# Patient Record
Sex: Female | Born: 1945
Health system: Southern US, Community
[De-identification: ages and names within clinical notes are randomized; demographics above are authoritative.]

## PROBLEM LIST (undated history)

## (undated) DIAGNOSIS — I3139 Other pericardial effusion (noninflammatory): Secondary | ICD-10-CM

## (undated) DIAGNOSIS — E785 Hyperlipidemia, unspecified: Secondary | ICD-10-CM

## (undated) DIAGNOSIS — I313 Pericardial effusion (noninflammatory): Secondary | ICD-10-CM

## (undated) DIAGNOSIS — E079 Disorder of thyroid, unspecified: Secondary | ICD-10-CM

## (undated) DIAGNOSIS — L237 Allergic contact dermatitis due to plants, except food: Secondary | ICD-10-CM

## (undated) DIAGNOSIS — I341 Nonrheumatic mitral (valve) prolapse: Secondary | ICD-10-CM

## (undated) DIAGNOSIS — R42 Dizziness and giddiness: Secondary | ICD-10-CM

## (undated) HISTORY — DX: Pericardial effusion (noninflammatory): I31.3

## (undated) HISTORY — DX: Nonrheumatic mitral (valve) prolapse: I34.1

## (undated) HISTORY — DX: Dizziness and giddiness: R42

## (undated) HISTORY — DX: Allergic contact dermatitis due to plants, except food: L23.7

## (undated) HISTORY — DX: Disorder of thyroid, unspecified: E07.9

## (undated) HISTORY — DX: Hyperlipidemia, unspecified: E78.5

## (undated) HISTORY — PX: SPINE SURGERY: SHX786

## (undated) HISTORY — DX: Other pericardial effusion (noninflammatory): I31.39

---
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
## 1981-09-28 HISTORY — PX: TUBAL LIGATION: SHX77

## 1982-09-28 DIAGNOSIS — I341 Nonrheumatic mitral (valve) prolapse: Secondary | ICD-10-CM

## 1982-09-28 HISTORY — DX: Nonrheumatic mitral (valve) prolapse: I34.1

## 1996-09-28 HISTORY — PX: BREAST BIOPSY: SHX20

## 2001-02-09 ENCOUNTER — Encounter: Payer: Self-pay | Admitting: Internal Medicine

## 2001-02-09 ENCOUNTER — Ambulatory Visit (HOSPITAL_COMMUNITY): Admission: RE | Admit: 2001-02-09 | Discharge: 2001-02-09 | Payer: Self-pay | Admitting: Internal Medicine

## 2001-03-04 ENCOUNTER — Ambulatory Visit (HOSPITAL_COMMUNITY): Admission: RE | Admit: 2001-03-04 | Discharge: 2001-03-04 | Payer: Self-pay | Admitting: Internal Medicine

## 2001-03-04 ENCOUNTER — Encounter: Payer: Self-pay | Admitting: Internal Medicine

## 2002-02-10 ENCOUNTER — Ambulatory Visit (HOSPITAL_COMMUNITY): Admission: RE | Admit: 2002-02-10 | Discharge: 2002-02-10 | Payer: Self-pay | Admitting: Internal Medicine

## 2002-02-10 ENCOUNTER — Encounter: Payer: Self-pay | Admitting: Internal Medicine

## 2003-02-15 ENCOUNTER — Encounter: Payer: Self-pay | Admitting: Internal Medicine

## 2003-02-15 ENCOUNTER — Ambulatory Visit (HOSPITAL_COMMUNITY): Admission: RE | Admit: 2003-02-15 | Discharge: 2003-02-15 | Payer: Self-pay | Admitting: Internal Medicine

## 2003-12-25 ENCOUNTER — Ambulatory Visit (HOSPITAL_COMMUNITY): Admission: RE | Admit: 2003-12-25 | Discharge: 2003-12-25 | Payer: Self-pay | Admitting: Family Medicine

## 2004-02-19 ENCOUNTER — Ambulatory Visit (HOSPITAL_COMMUNITY): Admission: RE | Admit: 2004-02-19 | Discharge: 2004-02-19 | Payer: Self-pay | Admitting: Internal Medicine

## 2004-08-01 ENCOUNTER — Encounter: Payer: Self-pay | Admitting: Family Medicine

## 2004-08-01 ENCOUNTER — Ambulatory Visit (HOSPITAL_COMMUNITY): Admission: RE | Admit: 2004-08-01 | Discharge: 2004-08-01 | Payer: Self-pay | Admitting: Family Medicine

## 2004-09-15 ENCOUNTER — Ambulatory Visit: Payer: Self-pay | Admitting: Internal Medicine

## 2004-10-17 ENCOUNTER — Ambulatory Visit (HOSPITAL_COMMUNITY): Admission: RE | Admit: 2004-10-17 | Discharge: 2004-10-17 | Payer: Self-pay | Admitting: Internal Medicine

## 2004-10-17 ENCOUNTER — Encounter: Payer: Self-pay | Admitting: Family Medicine

## 2004-10-17 ENCOUNTER — Ambulatory Visit: Payer: Self-pay | Admitting: Internal Medicine

## 2005-02-14 LAB — HM MAMMOGRAPHY: HM Mammogram: NORMAL

## 2005-04-02 ENCOUNTER — Ambulatory Visit (HOSPITAL_COMMUNITY): Admission: RE | Admit: 2005-04-02 | Discharge: 2005-04-02 | Payer: Self-pay | Admitting: Family Medicine

## 2005-10-21 ENCOUNTER — Ambulatory Visit: Payer: Self-pay | Admitting: Family Medicine

## 2005-10-21 ENCOUNTER — Ambulatory Visit (HOSPITAL_COMMUNITY): Admission: RE | Admit: 2005-10-21 | Discharge: 2005-10-21 | Payer: Self-pay | Admitting: Family Medicine

## 2005-10-26 ENCOUNTER — Ambulatory Visit (HOSPITAL_COMMUNITY): Admission: RE | Admit: 2005-10-26 | Discharge: 2005-10-26 | Payer: Self-pay | Admitting: Family Medicine

## 2005-10-28 ENCOUNTER — Ambulatory Visit (HOSPITAL_COMMUNITY): Admission: RE | Admit: 2005-10-28 | Discharge: 2005-10-28 | Payer: Self-pay | Admitting: Family Medicine

## 2006-03-04 ENCOUNTER — Encounter: Payer: Self-pay | Admitting: Family Medicine

## 2006-03-04 ENCOUNTER — Ambulatory Visit: Payer: Self-pay | Admitting: Family Medicine

## 2006-03-04 ENCOUNTER — Other Ambulatory Visit: Admission: RE | Admit: 2006-03-04 | Discharge: 2006-03-04 | Payer: Self-pay | Admitting: Family Medicine

## 2006-04-26 ENCOUNTER — Ambulatory Visit (HOSPITAL_COMMUNITY): Admission: RE | Admit: 2006-04-26 | Discharge: 2006-04-26 | Payer: Self-pay | Admitting: Family Medicine

## 2006-05-05 ENCOUNTER — Encounter: Admission: RE | Admit: 2006-05-05 | Discharge: 2006-05-05 | Payer: Self-pay | Admitting: Family Medicine

## 2006-05-12 ENCOUNTER — Ambulatory Visit (HOSPITAL_COMMUNITY): Payer: Self-pay | Admitting: Oncology

## 2006-05-12 ENCOUNTER — Encounter: Admission: RE | Admit: 2006-05-12 | Discharge: 2006-05-12 | Payer: Self-pay | Admitting: Oncology

## 2006-09-28 HISTORY — PX: BACK SURGERY: SHX140

## 2006-11-15 ENCOUNTER — Ambulatory Visit (HOSPITAL_COMMUNITY): Payer: Self-pay | Admitting: Oncology

## 2006-11-15 ENCOUNTER — Encounter (HOSPITAL_COMMUNITY): Admission: RE | Admit: 2006-11-15 | Discharge: 2006-12-15 | Payer: Self-pay | Admitting: Oncology

## 2007-02-24 ENCOUNTER — Ambulatory Visit: Payer: Self-pay | Admitting: Family Medicine

## 2007-03-10 ENCOUNTER — Encounter (HOSPITAL_COMMUNITY): Admission: RE | Admit: 2007-03-10 | Discharge: 2007-04-09 | Payer: Self-pay | Admitting: Family Medicine

## 2007-03-14 ENCOUNTER — Encounter: Payer: Self-pay | Admitting: Family Medicine

## 2007-03-14 LAB — CONVERTED CEMR LAB
BUN: 14 mg/dL (ref 6–23)
Basophils Absolute: 0 10*3/uL (ref 0.0–0.1)
Basophils Relative: 1 % (ref 0–1)
CO2: 29 meq/L (ref 19–32)
Calcium: 9.7 mg/dL (ref 8.4–10.5)
Chloride: 104 meq/L (ref 96–112)
Cholesterol: 200 mg/dL (ref 0–200)
Creatinine, Ser: 0.64 mg/dL (ref 0.40–1.20)
Eosinophils Relative: 2 % (ref 0–5)
HDL: 67 mg/dL (ref >39)
LDL Cholesterol: 121 mg/dL — ABNORMAL HIGH (ref 0–99)
Lymphocytes Relative: 30 % (ref 12–46)
Lymphs Abs: 1.4 10*3/uL (ref 0.7–3.3)
MCHC: 33.6 g/dL (ref 30.0–36.0)
MCV: 92.1 fL (ref 78.0–100.0)
Monocytes Relative: 8 % (ref 3–11)
Neutro Abs: 2.7 10*3/uL (ref 1.7–7.7)
Platelets: 230 10*3/uL (ref 150–400)
Potassium: 4.7 meq/L (ref 3.5–5.3)
RBC: 4.68 M/uL (ref 3.87–5.11)
Triglycerides: 61 mg/dL (ref <150)
VLDL: 12 mg/dL (ref 0–40)

## 2007-04-11 ENCOUNTER — Ambulatory Visit (HOSPITAL_COMMUNITY): Admission: RE | Admit: 2007-04-11 | Discharge: 2007-04-11 | Payer: Self-pay | Admitting: Family Medicine

## 2007-04-12 ENCOUNTER — Encounter (HOSPITAL_COMMUNITY): Admission: RE | Admit: 2007-04-12 | Discharge: 2007-05-12 | Payer: Self-pay | Admitting: Family Medicine

## 2007-07-22 ENCOUNTER — Ambulatory Visit (HOSPITAL_COMMUNITY): Admission: RE | Admit: 2007-07-22 | Discharge: 2007-07-22 | Payer: Self-pay | Admitting: Family Medicine

## 2007-07-29 ENCOUNTER — Ambulatory Visit (HOSPITAL_COMMUNITY): Admission: RE | Admit: 2007-07-29 | Discharge: 2007-07-30 | Payer: Self-pay | Admitting: Neurosurgery

## 2007-09-29 ENCOUNTER — Encounter: Payer: Self-pay | Admitting: Family Medicine

## 2007-11-25 ENCOUNTER — Encounter (HOSPITAL_COMMUNITY): Admission: RE | Admit: 2007-11-25 | Discharge: 2007-12-25 | Payer: Self-pay | Admitting: Oncology

## 2008-02-08 ENCOUNTER — Encounter: Payer: Self-pay | Admitting: Family Medicine

## 2008-02-08 ENCOUNTER — Other Ambulatory Visit: Admission: RE | Admit: 2008-02-08 | Discharge: 2008-02-08 | Payer: Self-pay | Admitting: Family Medicine

## 2008-02-08 ENCOUNTER — Ambulatory Visit: Payer: Self-pay | Admitting: Family Medicine

## 2008-02-08 LAB — CONVERTED CEMR LAB: Pap Smear: NORMAL

## 2008-02-14 ENCOUNTER — Ambulatory Visit (HOSPITAL_COMMUNITY): Admission: RE | Admit: 2008-02-14 | Discharge: 2008-02-14 | Payer: Self-pay | Admitting: Family Medicine

## 2008-02-16 ENCOUNTER — Encounter: Payer: Self-pay | Admitting: Family Medicine

## 2008-02-16 DIAGNOSIS — E785 Hyperlipidemia, unspecified: Secondary | ICD-10-CM | POA: Insufficient documentation

## 2008-02-23 ENCOUNTER — Encounter: Payer: Self-pay | Admitting: Family Medicine

## 2008-02-23 LAB — CONVERTED CEMR LAB
BUN: 16 mg/dL (ref 6–23)
CO2: 28 meq/L (ref 19–32)
Calcium: 9.3 mg/dL (ref 8.4–10.5)
Chloride: 104 meq/L (ref 96–112)
Creatinine, Ser: 0.61 mg/dL (ref 0.40–1.20)
Eosinophils Absolute: 0.1 10*3/uL (ref 0.0–0.7)
Eosinophils Relative: 3 % (ref 0–5)
Glucose, Bld: 94 mg/dL (ref 70–99)
Hemoglobin: 14.7 g/dL (ref 12.0–15.0)
Lymphs Abs: 1.3 10*3/uL (ref 0.7–4.0)
MCHC: 32.5 g/dL (ref 30.0–36.0)
MCV: 93 fL (ref 78.0–100.0)
Monocytes Absolute: 0.4 10*3/uL (ref 0.1–1.0)
Neutrophils Relative %: 57 % (ref 43–77)
Platelets: 230 10*3/uL (ref 150–400)
RBC: 4.87 M/uL (ref 3.87–5.11)
Sodium: 144 meq/L (ref 135–145)
Total CHOL/HDL Ratio: 2.9
Triglycerides: 92 mg/dL (ref <150)
WBC: 4.3 10*3/uL (ref 4.0–10.5)

## 2008-08-02 ENCOUNTER — Ambulatory Visit (HOSPITAL_COMMUNITY): Admission: RE | Admit: 2008-08-02 | Discharge: 2008-08-02 | Payer: Self-pay | Admitting: Family Medicine

## 2009-01-16 ENCOUNTER — Telehealth: Payer: Self-pay | Admitting: Family Medicine

## 2009-01-17 ENCOUNTER — Ambulatory Visit: Payer: Self-pay | Admitting: Family Medicine

## 2009-01-17 DIAGNOSIS — L255 Unspecified contact dermatitis due to plants, except food: Secondary | ICD-10-CM | POA: Insufficient documentation

## 2009-02-18 ENCOUNTER — Telehealth: Payer: Self-pay | Admitting: Family Medicine

## 2009-06-04 ENCOUNTER — Encounter: Payer: Self-pay | Admitting: Family Medicine

## 2009-08-19 ENCOUNTER — Ambulatory Visit (HOSPITAL_COMMUNITY): Admission: RE | Admit: 2009-08-19 | Discharge: 2009-08-19 | Payer: Self-pay | Admitting: Family Medicine

## 2010-01-14 ENCOUNTER — Ambulatory Visit: Payer: Self-pay | Admitting: Cardiovascular Disease

## 2010-01-14 DIAGNOSIS — R072 Precordial pain: Secondary | ICD-10-CM | POA: Insufficient documentation

## 2010-03-24 ENCOUNTER — Other Ambulatory Visit: Admission: RE | Admit: 2010-03-24 | Discharge: 2010-03-24 | Payer: Self-pay | Admitting: Family Medicine

## 2010-03-24 ENCOUNTER — Ambulatory Visit: Payer: Self-pay | Admitting: Family Medicine

## 2010-03-24 DIAGNOSIS — R5383 Other fatigue: Secondary | ICD-10-CM | POA: Insufficient documentation

## 2010-03-24 LAB — CONVERTED CEMR LAB: OCCULT 1: NEGATIVE

## 2010-03-26 DIAGNOSIS — M810 Age-related osteoporosis without current pathological fracture: Secondary | ICD-10-CM | POA: Insufficient documentation

## 2010-03-27 ENCOUNTER — Encounter: Payer: Self-pay | Admitting: Family Medicine

## 2010-03-27 ENCOUNTER — Telehealth: Payer: Self-pay | Admitting: Family Medicine

## 2010-03-27 LAB — CONVERTED CEMR LAB: Pap Smear: NEGATIVE

## 2010-03-28 ENCOUNTER — Encounter: Payer: Self-pay | Admitting: Family Medicine

## 2010-04-16 ENCOUNTER — Encounter: Payer: Self-pay | Admitting: Family Medicine

## 2010-04-21 LAB — CONVERTED CEMR LAB
BUN: 16 mg/dL (ref 6–23)
Basophils Relative: 1 % (ref 0–1)
CO2: 29 meq/L (ref 19–32)
Cholesterol: 214 mg/dL — ABNORMAL HIGH (ref 0–200)
Creatinine, Ser: 0.69 mg/dL (ref 0.40–1.20)
Glucose, Bld: 94 mg/dL (ref 70–99)
HCT: 45.7 % (ref 36.0–46.0)
Lymphs Abs: 1.3 10*3/uL (ref 0.7–4.0)
MCHC: 31.5 g/dL (ref 30.0–36.0)
MCV: 93.1 fL (ref 78.0–100.0)
Monocytes Absolute: 0.4 10*3/uL (ref 0.1–1.0)
Monocytes Relative: 8 % (ref 3–12)
Potassium: 5.3 meq/L (ref 3.5–5.3)
RBC: 4.91 M/uL (ref 3.87–5.11)
Sodium: 144 meq/L (ref 135–145)
Total CHOL/HDL Ratio: 3.1
Triglycerides: 91 mg/dL (ref <150)
VLDL: 18 mg/dL (ref 0–40)
WBC: 4.4 10*3/uL (ref 4.0–10.5)

## 2010-04-22 ENCOUNTER — Telehealth: Payer: Self-pay | Admitting: Family Medicine

## 2010-09-11 ENCOUNTER — Ambulatory Visit (HOSPITAL_COMMUNITY)
Admission: RE | Admit: 2010-09-11 | Discharge: 2010-09-11 | Payer: Self-pay | Source: Home / Self Care | Attending: Family Medicine | Admitting: Family Medicine

## 2010-10-19 ENCOUNTER — Encounter: Payer: Self-pay | Admitting: Family Medicine

## 2010-10-19 ENCOUNTER — Encounter: Payer: Self-pay | Admitting: *Deleted

## 2010-10-28 NOTE — Letter (Signed)
Summary: Letter  Letter   Imported By: Lind Guest 03/28/2010 08:37:34  _____________________________________________________________________  External Attachment:    Type:   Image     Comment:   External Document

## 2010-10-28 NOTE — Letter (Signed)
Summary: [employer]  [employer]   Imported By: Lind Guest 03/28/2010 08:42:08  _____________________________________________________________________  External Attachment:    Type:   Image     Comment:   External Document

## 2010-10-28 NOTE — Assessment & Plan Note (Signed)
Summary: physical   Vital Signs:  Patient profile:   65 year old female Menstrual status:  postmenopausal Height:      66 inches Weight:      133.75 pounds BMI:     21.67 O2 Sat:      97 % Pulse rate:   70 / minute Pulse rhythm:   regular Resp:     16 per minute BP sitting:   110 / 70  (left arm) Cuff size:   regular  Vitals Entered By: Everitt Amber LPN (March 24, 2010 2:31 PM)  Vision Screening:Left eye with correction: 20 / 20 Right eye with correction: 20 / 20 Both eyes with correction: 20 / 20  Color vision testing: normal      Vision Entered By: Everitt Amber LPN (March 24, 2010 2:32 PM)     Menstrual Status postmenopausal Last PAP Result normal   Primary Care Provider:  Syliva Overman   History of Present Illness: Reports  that t she has been doing well. She remains focussed on healthy eating and regular exercise , espescially since herspouse now has CAD  Denies recent fever or chills. Denies sinus pressure, nasal congestion , ear pain or sore throat. Denies chest congestion, or cough productive of sputum. Denies chest pain, palpitations, PND, orthopnea or leg swelling. Denies abdominal pain, nausea, vomitting, diarrhea or constipation. Denies change in bowel movements or bloody stool. Denies dysuria , frequency, incontinence or hesitancy. Denies  joint pain, swelling, or reduced mobility.She does have concerns about  the cost of actonel and is requesting coupons, I will need to see if she has been on the med for over 5 yrs as it should be discontinued after that, i will get back to her. Denies headaches, vertigo, seizures. Denies depression, anxiety or insomnia. Denies  rash, lesions, or itch.     Current Medications (verified): 1)  Actonel 150 Mg Tabs (Risedronate Sodium) .... Take One Tab Once Monthly 2)  Oysco 500+d 500-600 Mg-Unit Chew (Calcium Carb-Cholecalciferol) .... One Chew Three Times A Day 3)  Sulfamethoxazole-Tmp Ds 800-160 Mg Tabs  (Sulfamethoxazole-Trimethoprim) .... Take 1 Tablet By Mouth Two Times A Day As Needed  Allergies (verified): No Known Drug Allergies  Past History:  Past Medical History: Borderline hyperlipidemia  DERMATITIS, POISON OAK Vertigo Mitral valve prolapse diagnosed in 1984 cystitis      Review of Systems      See HPI Eyes:  Denies blurring, discharge, eye pain, and red eye. MS:  Denies joint pain and joint redness. Endo:  Denies cold intolerance, excessive hunger, excessive thirst, excessive urination, heat intolerance, polyuria, and weight change. Heme:  Denies abnormal bruising and bleeding. Allergy:  Denies hives or rash and itching eyes.  Physical Exam  General:  Well-developed,well-nourished,in no acute distress; alert,appropriate and cooperative throughout examination Head:  Normocephalic and atraumatic without obvious abnormalities. No apparent alopecia or balding. Eyes:  No corneal or conjunctival inflammation noted. EOMI. Perrla. Funduscopic exam benign, without hemorrhages, exudates or papilledema. Vision grossly normal. Ears:  External ear exam shows no significant lesions or deformities.  Otoscopic examination reveals clear canals, tympanic membranes are intact bilaterally without bulging, retraction, inflammation or discharge. Hearing is grossly normal bilaterally. Nose:  External nasal examination shows no deformity or inflammation. Nasal mucosa are pink and moist without lesions or exudates. Mouth:  Oral mucosa and oropharynx without lesions or exudates.  Teeth in good repair. Neck:  No deformities, masses, or tenderness noted. Chest Wall:  No deformities, masses, or tenderness noted. Breasts:  No mass, nodules, thickening, tenderness, bulging, retraction, inflamation, nipple discharge or skin changes noted.   Lungs:  Normal respiratory effort, chest expands symmetrically. Lungs are clear to auscultation, no crackles or wheezes. Heart:  Normal rate and regular rhythm.  S1 and S2 normal without gallop, murmur, click, rub or other extra sounds. Abdomen:  Bowel sounds positive,abdomen soft and non-tender without masses, organomegaly or hernias noted. Rectal:  No external abnormalities noted. Normal sphincter tone. No rectal masses or tenderness. Genitalia:  Normal introitus for age, no external lesions, no vaginal discharge, mucosa pink and moist, no vaginal or cervical lesions, no vaginal atrophy, no friaility or hemorrhage, normal uterus size and position, no adnexal masses or tenderness Msk:  No deformity or scoliosis noted of thoracic or lumbar spine.   Pulses:  R and L carotid,radial,femoral,dorsalis pedis and posterior tibial pulses are full and equal bilaterally Extremities:  No clubbing, cyanosis, edema, or deformity noted with normal full range of motion of all joints.   Neurologic:  No cranial nerve deficits noted. Station and gait are normal. Plantar reflexes are down-going bilaterally. DTRs are symmetrical throughout. Sensory, motor and coordinative functions appear intact. Skin:  Intact without suspicious lesions or rashes Cervical Nodes:  No lymphadenopathy noted Axillary Nodes:  No palpable lymphadenopathy Inguinal Nodes:  No significant adenopathy Psych:  Cognition and judgment appear intact. Alert and cooperative with normal attention span and concentration. No apparent delusions, illusions, hallucinations   Impression & Recommendations:  Problem # 1:  DYSLIPIDEMIA (ICD-272.4) Assessment Comment Only  Orders: T-Lipid Profile (16109-60454)    HDL:66 (02/23/2008), 67 (03/14/2007)  LDL:106 (02/23/2008), 121 (03/14/2007)  Chol:190 (02/23/2008), 200 (03/14/2007)  Trig:92 (02/23/2008), 61 (03/14/2007)  Problem # 2:  OBESITY (ICD-278.00) [pt currently on actonel , will need to research the duration before refilling  Complete Medication List: 1)  Actonel 150 Mg Tabs (Risedronate sodium) .... Take one tab once monthly 2)  Oysco 500+d 500-600  Mg-unit Chew (Calcium carb-cholecalciferol) .... One chew three times a day 3)  Sulfamethoxazole-tmp Ds 800-160 Mg Tabs (Sulfamethoxazole-trimethoprim) .... Take 1 tablet by mouth two times a day as needed  Other Orders: T-Basic Metabolic Panel [phone]) T-CBC w/Diff [phone]) Pap Smear (57846) Hemoccult Guaiac-1 spec.(in office) (96295)  Patient Instructions: 1)  CPE in 1 year, pls call for appt. 2)  We will contact you regarding your need fopr  colonscopy, bone density scan and whether you need to stay on the actonel. We will also try to get actonerl tabs. 3)  Pls take asprin 81mg  one daily, calcim with d 1200mg /100IU one daily and one vitamin daily.    4)  It is important that you exercise regularly at least 20 minutes 5 times a week. If you develop chest pain, have severe difficulty breathing, or feel very tired , stop exercising immediately and seek medical attention.      Laboratory Results    Stool - Occult Blood Hemmoccult #1: negative Date: 03/24/2010 Comments: [id number] 9r 8/11 118 10 12

## 2010-10-28 NOTE — Letter (Signed)
Summary: consult  consult   Imported By: Curtis Sites 04/23/2010 14:28:12  _____________________________________________________________________  External Attachment:    Type:   Image     Comment:   External Document

## 2010-10-28 NOTE — Letter (Signed)
Summary: misc.  misc.   Imported By: Curtis Sites 04/23/2010 14:29:33  _____________________________________________________________________  External Attachment:    Type:   Image     Comment:   External Document

## 2010-10-28 NOTE — Letter (Signed)
Summary: phone notes  phone notes   Imported By: Curtis Sites 04/23/2010 14:29:49  _____________________________________________________________________  External Attachment:    Type:   Image     Comment:   External Document

## 2010-10-28 NOTE — Assessment & Plan Note (Signed)
Summary: np eval self referral her husband is a pt of dr. Clifton James   Visit Type:  Initial Consult Primary Provider:  Syliva Overman  CC:  chest pain.  History of Present Illness: 65 yo WF with history of borderline hyperlipidemia who is here today as a self referral for evaluation of chest pain. She tells me that she was on a ski trip in February and had onset of left sided chest heaviness while at rest. NO associated SOB or sweatiness. This was during a stressful time when she had locked herself out of the house. It only lasted for several minutes. She has been exercising daily without any recurrence of symptoms.  She has no exertional chest pain or SOB.   Current Medications (verified): 1)  Actonel 150 Mg Tabs (Risedronate Sodium) .... Take One Tab Once Monthly 2)  Oscal 500/200 D-3 500-200 Mg-Unit Tabs (Calcium-Vitamin D) .... One Tab By Mouth Tid 3)  Septra Ds 800-160 Mg Tabs (Sulfamethoxazole-Trimethoprim) .... One Tab By Mouth Two Times A Day  As Needed  Allergies (verified): No Known Drug Allergies  Past History:  Past Medical History: Borderline hyperlipidemia  DERMATITIS, POISON OAK Vertigo Mitral valve prolapse diagnosed in 1984      Past Surgical History: Left breast bx- benign 1998 Tonsillectomy (1965) Tubal ligation (1983) Back surgery 2008  Family History: Reviewed history from 01/08/2010 and no changes required. THREE CHILDREN MOTHER-  DECEASED / SEVERE ARTHRITIS / USED TOBACCO/MI FATHER-  DECEASED  age 93 HEART ATTACK / ALCOHOL / Tobacco ONE SISTER LIVING / SEVERE OSTERPOROSIS ONE BROTHER  LIVING, Prostate cancer  Social History: Reviewed history from 02/16/2008 and no changes required. 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  The patient denies fatigue, malaise, fever,  weight gain/loss, vision loss, decreased hearing, hoarseness, palpitations, shortness of breath, prolonged cough, wheezing, sleep apnea, coughing up blood, abdominal pain, blood in stool, nausea, vomiting, diarrhea, heartburn, incontinence, blood in urine, muscle weakness, joint pain, leg swelling, rash, skin lesions, headache, fainting, dizziness, depression, anxiety, enlarged lymph nodes, easy bruising or bleeding, and environmental allergies.    Vital Signs:  Patient profile:   65 year old female Height:      66 inches Weight:      130 pounds BMI:     21.06 Pulse rate:   65 / minute Pulse rhythm:   regular BP sitting:   112 / 60  (left arm) Cuff size:   regular  Vitals Entered By: Danielle Rankin, CMA (January 14, 2010 11:52 AM)  Physical Exam  General:  General: Well developed, well nourished, NAD HEENT: OP clear, mucus membranes moist SKIN: warm, dry Neuro: No focal deficits Musculoskeletal: Muscle strength 5/5 all ext Psychiatric: Mood and affect normal Neck: No JVD, no carotid bruits, no thyromegaly, no lymphadenopathy. Lungs:Clear bilaterally, no wheezes, rhonci, crackles CV: RRR no murmurs, gallops rubs Abdomen: soft, NT, ND, BS present Extremities: No edema, pulses 2+.    EKG  Procedure date:  01/14/2010  Findings:      NSR, rate 65 bpm. Normal EKG.   Impression & Recommendations:  Problem # 1:  CHEST PAIN-PRECORDIAL (ICD-786.51) Atypical pain. No recurrence. Her EKG is normal. BP is normal. She has no cardiac risk factors (Her LDL was 106 in 2009 with HDL of 66). I do not think further cardiac workup is  necessary. I will see her back in one year. She will have her lipids followed in primary care.   Patient Instructions: 1)  Your physician recommends that you schedule a follow-up appointment in: 12 months

## 2010-10-28 NOTE — Letter (Signed)
Summary: xray  xray   Imported By: Curtis Sites 04/23/2010 14:30:22  _____________________________________________________________________  External Attachment:    Type:   Image     Comment:   External Document

## 2010-10-28 NOTE — Progress Notes (Signed)
Summary: call  Phone Note Call from Patient   Summary of Call: neds to talk you about her #s call back at [phone] Initial call taken by: Lind Guest,  April 22, 2010 10:42 AM  Follow-up for Phone Call        called back left message Follow-up by: Everitt Amber LPN,  April 22, 2010 12:51 PM  Additional Follow-up for Phone Call Additional follow up Details #1::        wants copy of labs and will send info on cholesterol Additional Follow-up by: Everitt Amber LPN,  April 22, 2010 1:13 PM

## 2010-10-28 NOTE — Letter (Signed)
Summary: labs  labs   Imported By: Curtis Sites 04/23/2010 14:29:13  _____________________________________________________________________  External Attachment:    Type:   Image     Comment:   External Document

## 2010-10-28 NOTE — Letter (Signed)
Summary: history and physical  history and physical   Imported By: Curtis Sites 04/23/2010 14:28:58  _____________________________________________________________________  External Attachment:    Type:   Image     Comment:   External Document

## 2010-10-28 NOTE — Progress Notes (Signed)
  Phone Note From Other Clinic   Caller: dr Marium Ragan Summary of Call: pls fax colonscopy and ba enema amd letter  to Dr Renae Fickle with cover sheet, then to be scanned into chartt  Initial call taken by: Syliva Overman MD,  March 27, 2010 3:22 PM  Follow-up for Phone Call        done Follow-up by: Everitt Amber LPN,  March 27, 2010 3:32 PM

## 2010-10-28 NOTE — Letter (Signed)
Summary: demographic  demographic   Imported By: Curtis Sites 04/23/2010 14:28:34  _____________________________________________________________________  External Attachment:    Type:   Image     Comment:   External Document

## 2010-10-28 NOTE — Op Note (Signed)
Summary: Operative Report  Operative Report   Imported By: Lind Guest 03/28/2010 08:38:55  _____________________________________________________________________  External Attachment:    Type:   Image     Comment:   External Document

## 2010-10-28 NOTE — Letter (Signed)
Summary: progress notes  progress notes   Imported By: Curtis Sites 04/23/2010 14:30:04  _____________________________________________________________________  External Attachment:    Type:   Image     Comment:   External Document

## 2010-10-28 NOTE — Letter (Signed)
Summary: TO DR.REHMAN  TO DR.REHMAN   Imported By: Lind Guest 03/28/2010 08:38:07  _____________________________________________________________________  External Attachment:    Type:   Image     Comment:   External Document

## 2010-11-11 ENCOUNTER — Telehealth (INDEPENDENT_AMBULATORY_CARE_PROVIDER_SITE_OTHER): Payer: Self-pay | Admitting: *Deleted

## 2010-11-12 DIAGNOSIS — M549 Dorsalgia, unspecified: Secondary | ICD-10-CM | POA: Insufficient documentation

## 2010-11-13 ENCOUNTER — Telehealth (INDEPENDENT_AMBULATORY_CARE_PROVIDER_SITE_OTHER): Payer: Self-pay | Admitting: *Deleted

## 2010-11-17 ENCOUNTER — Telehealth: Payer: Self-pay | Admitting: Family Medicine

## 2010-11-18 ENCOUNTER — Encounter: Payer: Self-pay | Admitting: Family Medicine

## 2010-11-18 ENCOUNTER — Telehealth (INDEPENDENT_AMBULATORY_CARE_PROVIDER_SITE_OTHER): Payer: Self-pay | Admitting: *Deleted

## 2010-11-18 ENCOUNTER — Ambulatory Visit (INDEPENDENT_AMBULATORY_CARE_PROVIDER_SITE_OTHER): Payer: PRIVATE HEALTH INSURANCE | Admitting: Family Medicine

## 2010-11-18 DIAGNOSIS — M549 Dorsalgia, unspecified: Secondary | ICD-10-CM

## 2010-11-19 NOTE — Progress Notes (Signed)
Summary: see Dr. Trey Sailors  Phone Note Call from Patient   Summary of Call: needs a referral to dr mark roy in [place] has not been there in 3.5 years since she  had surgery call back at [phone] or [phone] Initial call taken by: Lind Guest,  November 11, 2010 11:36 AM  Follow-up for Phone Call        pt has had back surgery with this doc in the past , per spouse has recently developed back pain, pls call dr roy's office and see if he will see her and set up appt Follow-up by: Syliva Overman MD,  November 12, 2010 10:18 PM  Additional Follow-up for Phone Call Additional follow up Details #1::        had to send all information to dr. Rolan Bucco office. and they will call pt with appt. pt is aware of this and is okay with them calling her. Additional Follow-up by: Rudene Anda,  November 13, 2010 12:18 PM  New Problems: BACK PAIN (ICD-724.5)   New Problems: BACK PAIN (ICD-724.5)

## 2010-11-21 ENCOUNTER — Encounter: Payer: Self-pay | Admitting: Family Medicine

## 2010-11-24 ENCOUNTER — Encounter: Payer: Self-pay | Admitting: Family Medicine

## 2010-11-25 NOTE — Assessment & Plan Note (Signed)
Summary: per dr   Vital Signs:  Patient profile:   65 year old female Menstrual status:  postmenopausal Height:      66 inches Weight:      128.75 pounds BMI:     20.86 O2 Sat:      99 % Pulse rate:   69 / minute Pulse rhythm:   regular Resp:     16 per minute BP sitting:   140 / 78  (left arm) Cuff size:   regular  Vitals Entered By: Everitt Amber LPN (November 18, 2010 1:55 PM) CC: c/o lower back pain, down left leg, since Feb 9th Pain Assessment Patient in pain? yes     Location: lower back  Intensity: 5 Type: aching  Onset of pain  11/06/2010   Primary Care Provider:  Syliva Overman  CC:  c/o lower back pain, down left leg, and since Feb 9th.  History of Present Illness: Pt reports on 11/06/2010  after bending over , she felt an immediate pain and "pop", she has had back  pain radiating  down the left lower thigh to just below the knee about 5 inches. since that time. She has ahd back surgery approx 3.5 yrs ago, and delayed definitive treatment at that time.she is anxious not to repeat this . She denies lower ext weakness or sensory loss, or incontinence of stool or urine She also states in recent times she has been aiding with moving an elderly lady which may well be an additional aggravant. She otherwise has no concerns.  Current Medications (verified): 1)  Oysco 500+d 500-600 Mg-Unit Chew (Calcium Carb-Cholecalciferol) .... One Chew Three Times A Day 2)  Sulfamethoxazole-Tmp Ds 800-160 Mg Tabs (Sulfamethoxazole-Trimethoprim) .... Take 1 Tablet By Mouth Two Times A Day As Needed 3)  Calcium 1200/vitamin D 1000 .... Take 1 Tablet By Mouth Once A Day  Allergies (verified): No Known Drug Allergies  Review of Systems      See HPI General:  Complains of fatigue and sleep disorder; due tp back pain. ENT:  Denies hoarseness and nasal congestion. CV:  Denies chest pain or discomfort and palpitations. Resp:  Denies cough. GU:  Denies dysuria and urinary  frequency. MS:  Complains of low back pain. Psych:  Complains of anxiety; denies depression; mild anxiety over temporary debility esp as it relatesd to being able to lift her grandchild. Allergy:  Denies hives or rash and itching eyes.  Physical Exam  General:  Well-developed,well-nourished,in no acute distress; alert,appropriate and cooperative throughout examination. HEENT: No facial asymmetry,  EOMI, No sinus tenderness, TM's Clear,ropharynx  pink and moist.   Chest: Clear to auscultation bilaterally.  CVS: S1, S2, No murmurs, No S3.   Abd: Soft, Nontender.  MS: decreased  ROM spine,adequatein  hips, shoulders and knees.  Ext: No edema.   CNS: CN 2-12 intact, power tone and sensation normal throughout.   Skin: Intact, no visible lesions or rashes.  Psych: Good eye contact, normal affect.  Memory intact, not anxious or depressed appearing.    Impression & Recommendations:  Problem # 1:  BACK PAIN (ICD-724.5) Assessment Deteriorated  Her updated medication list for this problem includes:    Advil 200 Mg Tabs (Ibuprofen) .Marland KitchenMarland KitchenMarland KitchenMarland Kitchen 3 tablets twice daily for 7 days  Orders: Depo- Medrol 80mg  (J1040) Ketorolac-Toradol 15mg  (Z6109) Admin of Therapeutic Inj  intramuscular or subcutaneous (60454)  Problem # 2:  DYSLIPIDEMIA (ICD-272.4) Assessment: Comment Only    HDL:69 (04/16/2010), 66 (02/23/2008)  LDL:127 (04/16/2010), 106 (02/23/2008)  Chol:214 (  04/16/2010), 190 (02/23/2008)  Trig:91 (04/16/2010), 92 (02/23/2008) Low fat dietdiscussed and encouraged  Complete Medication List: 1)  Oysco 500+d 500-600 Mg-unit Chew (Calcium carb-cholecalciferol) .... One chew three times a day 2)  Sulfamethoxazole-tmp Ds 800-160 Mg Tabs (Sulfamethoxazole-trimethoprim) .... Take 1 tablet by mouth two times a day as needed 3)  Calcium 1200/vitamin D 1000  .... Take 1 tablet by mouth once a day 4)  Prednisone (pak) 5 Mg Tabs (Prednisone) .... Use as directed 5)  Advil 200 Mg Tabs (Ibuprofen) .... 3  tablets twice daily for 7 days 6)  Nexium 40 Mg Cpdr (Esomeprazole magnesium) .... Take 1 capsule by mouth once a day 7)  Omeprazole 20 Mg Cpdr (Omeprazole) .... Take 1 capsule by mouth two times a day, for 10 days , then as needed  Patient Instructions: 1)  CPE  in   [place], when due 2)  Today you will get an injection of toradol 60 mg IM   and depomedrol 80 mg Im. 3)  A prednisone 5mg  dose pack is also sent in, pls start taking this today. 4)  Please continue ibuprofen 200mg  3 tablets twice daily for the next 1 week. 5)  A script for nexium, for7 days will be given to you, this is to reduce the stomach irritation on the anti-inflammatory meds, a coupon is also available, for 7 free, if this is not honored, then use thescript for the generic alternative omeprazole , which I will also provide 6)  On exam you have  no evidence of nerve damage.Marland Kitchen 7)  Pls call in the next week if you are not over 90% better, at that time I will refer you to Dr Eduard Clos. Prescriptions: OMEPRAZOLE 20 MG CPDR (OMEPRAZOLE) Take 1 capsule by mouth two times a day, for 10 days , then as needed  #40 x 0   Entered and Authorized by:   Syliva Overman MD   Signed by:   Syliva Overman MD on 11/18/2010   Method used:   Print then Give to Patient   RxID:   [id number] NEXIUM 40 MG CPDR (ESOMEPRAZOLE MAGNESIUM) Take 1 capsule by mouth once a day  #7 x 0   Entered and Authorized by:   Syliva Overman MD   Signed by:   Syliva Overman MD on 11/18/2010   Method used:   Print then Give to Patient   RxID:   [id number] PREDNISONE (PAK) 5 MG TABS (PREDNISONE) Use as directed  #21 x 0   Entered and Authorized by:   Syliva Overman MD   Signed by:   Syliva Overman MD on 11/18/2010   Method used:   Electronically to        [facility] [phone]* (retail)       [address]       [place], [place]  [address]       Ph: [phone]       Fax: [phone]   RxID:   [id number]    Medication  Administration  Injection # 1:    Medication: Depo- Medrol 80mg     Diagnosis: BACK PAIN (ICD-724.5)    Route: IM    Site: RUOQ gluteus    Exp Date: 07/2011    Lot #: [id number]     Mfr: Pharmacia    Comments: 80mg  given     Patient tolerated injection without complications    Given by: Everitt Amber LPN (November 18, 2010 3:07 PM)  Injection #  2:    Medication: Ketorolac-Toradol 15mg     Diagnosis: BACK PAIN (ICD-724.5)    Route: IM    Site: LUOQ gluteus    Exp Date: 02/2012    Lot #: [id number]     Mfr: [email or web address]    Comments: 60mg  given     Patient tolerated injection without complications    Given by: Everitt Amber LPN (November 18, 2010 3:08 PM)  Orders Added: 1)  Est. Patient Level III [16109] 2)  Depo- Medrol 80mg  [J1040] 3)  Ketorolac-Toradol 15mg  [J1885] 4)  Admin of Therapeutic Inj  intramuscular or subcutaneous [96372]     Medication Administration  Injection # 1:    Medication: Depo- Medrol 80mg     Diagnosis: BACK PAIN (ICD-724.5)    Route: IM    Site: RUOQ gluteus    Exp Date: 07/2011    Lot #: [id number]     Mfr: Pharmacia    Comments: 80mg  given     Patient tolerated injection without complications    Given by: Everitt Amber LPN (November 18, 2010 3:07 PM)  Injection # 2:    Medication: Ketorolac-Toradol 15mg     Diagnosis: BACK PAIN (ICD-724.5)    Route: IM    Site: LUOQ gluteus    Exp Date: 02/2012    Lot #: [id number]     Mfr: [email or web address]    Comments: 60mg  given     Patient tolerated injection without complications    Given by: Everitt Amber LPN (November 18, 2010 3:08 PM)  Orders Added: 1)  Est. Patient Level III [60454] 2)  Depo- Medrol 80mg  [J1040] 3)  Ketorolac-Toradol 15mg  [J1885] 4)  Admin of Therapeutic Inj  intramuscular or subcutaneous [09811]

## 2010-11-25 NOTE — Progress Notes (Signed)
Summary: referral  Phone Note Call from Patient   Summary of Call: pt also stated when I called her about referral to dr. Channing Mutters. She would like for dr. Lodema Hong to also refer her to dr. Eduard Clos [phone] Initial call taken by: Rudene Anda,  November 13, 2010 1:15 PM  Follow-up for Phone Call        attempted to spk directly with the pt for more dtetails , had to lv a msg for her to return my call in am Follow-up by: Syliva Overman MD,  November 13, 2010 5:26 PM  Additional Follow-up for Phone Call Additional follow up Details #1::        [pt had back surgery approx 3.5 yrs ago following unsuccesful epidurals. Last week she bent and experienced back pain radiating down left leg, has tried to get help since, advil is providing some relief. I attempted to get appt with dr Eduard Clos but her office is closed today . pt advised take ibuprofen 200mg  3 tabs twice daily over the next 3 to 5 days, i will contact pain specialist on Monday for appt Additional Follow-up by: Syliva Overman MD,  November 14, 2010 12:26 PM    Additional Follow-up for Phone Call Additional follow up Details #2::    pls refer to dr Eduard Clos asap Follow-up by: Syliva Overman MD,  November 14, 2010 12:29 PM  Additional Follow-up for Phone Call Additional follow up Details #3:: Details for Additional Follow-up Action Taken: pts information has been sent to dr.bethea office. they will call pt with appt and time. pt is aaware of this and waiting on there call Additional Follow-up by: Rudene Anda,  November 17, 2010 11:37 AM

## 2010-11-25 NOTE — Progress Notes (Signed)
Summary: appt.  Phone Note Call from Patient   Summary of Call: DR. Eduard Clos  DOES NOT ACCEPT THIS PATIENTS INSURANCE Initial call taken by: Lind Guest,  November 17, 2010 12:53 PM  Follow-up for Phone Call        I spoke with pt and told her I will work her in tomorrow at 1:15 Follow-up by: Syliva Overman MD,  November 17, 2010 6:53 PM  Additional Follow-up for Phone Call Additional follow up Details #1::        had a opening for 2:15 so I put her in that spot but she will be here at 1:15 so please put her back per Dr. Lodema Hong is the one that told her to come in Additional Follow-up by: Lind Guest,  November 18, 2010 8:16 AM    Additional Follow-up for Phone Call Additional follow up Details #2::    Patient here for appt  Follow-up by: Everitt Amber LPN,  November 18, 2010 2:06 PM

## 2010-11-25 NOTE — Progress Notes (Signed)
Summary: cancel appoinment  Phone Note Call from Patient   Summary of Call: patient received injections today and needs for you to cancel her appt. at Dr. Lacinda Axon until she sees how this goes. Please cancel the appoinment Initial call taken by: Lind Guest,  November 18, 2010 3:00 PM  Follow-up for Phone Call        dr. Lacinda Axon office called and said they would cancel appt for patient.  Follow-up by: Rudene Anda,  November 18, 2010 3:33 PM

## 2010-12-02 ENCOUNTER — Ambulatory Visit (INDEPENDENT_AMBULATORY_CARE_PROVIDER_SITE_OTHER): Payer: PRIVATE HEALTH INSURANCE | Admitting: Family Medicine

## 2010-12-02 ENCOUNTER — Encounter: Payer: Self-pay | Admitting: Family Medicine

## 2010-12-02 DIAGNOSIS — M549 Dorsalgia, unspecified: Secondary | ICD-10-CM

## 2010-12-02 DIAGNOSIS — E785 Hyperlipidemia, unspecified: Secondary | ICD-10-CM

## 2010-12-03 ENCOUNTER — Encounter: Payer: Self-pay | Admitting: Family Medicine

## 2010-12-07 DIAGNOSIS — N3090 Cystitis, unspecified without hematuria: Secondary | ICD-10-CM | POA: Insufficient documentation

## 2010-12-08 ENCOUNTER — Other Ambulatory Visit (HOSPITAL_COMMUNITY): Payer: Self-pay | Admitting: Physical Medicine and Rehabilitation

## 2010-12-08 ENCOUNTER — Ambulatory Visit (HOSPITAL_COMMUNITY)
Admission: RE | Admit: 2010-12-08 | Discharge: 2010-12-08 | Disposition: A | Discharge status: Home / Self Care | Payer: PRIVATE HEALTH INSURANCE | Source: Ambulatory Visit | Attending: Physical Medicine and Rehabilitation | Admitting: Physical Medicine and Rehabilitation

## 2010-12-08 DIAGNOSIS — M545 Low back pain, unspecified: Secondary | ICD-10-CM

## 2010-12-08 DIAGNOSIS — M79605 Pain in left leg: Secondary | ICD-10-CM

## 2010-12-16 NOTE — Assessment & Plan Note (Signed)
Summary: OFFICE VISIT   Vital Signs:  Patient profile:   65 year old female Menstrual status:  postmenopausal Height:      66 inches Weight:      128 pounds BMI:     20.73 O2 Sat:      98 % Pulse rate:   78 / minute Pulse rhythm:   regular Resp:     16 per minute BP sitting:   122 / 82  (left arm) Cuff size:   regular  Vitals Entered By: Everitt Amber LPN (December 01, 1608 11:18 AM) CC: c/o still hurting in back and left leg, states injections and prednisone really helped but it didn't last long   Primary Care Provider:  Syliva Overman  CC:  c/o still hurting in back and left leg and states injections and prednisone really helped but it didn't last long.  History of Present Illness: pt states initially she had significant relidf from meds administered but back to a constant 5 with little activity, and up to 8 with minimal acticviy, able to sleep, lying is most comfortable position ,also walking . she denies lower extremity weakness, numbness, or incontinence. interested in further treatment , whatever deemed necessary. Denies any recent fever or chills. Reports post coital cystitis,not new, requests refill on septra for this.  Current Medications (verified): 1)  Calcium 1200mg  W/ Vitamin D 1000iu .... Take 1 Tablet By Mouth Once A Day 2)  Sulfamethoxazole-Tmp Ds 800-160 Mg Tabs (Sulfamethoxazole-Trimethoprim) .... Take 1 Tablet By Mouth Two Times A Day As Needed 3)  Calcium 1200/vitamin D 1000 .... Take 1 Tablet By Mouth Once A Day 4)  Advil 200 Mg Tabs (Ibuprofen) .... 3 Tablets Twice Daily For 7 Days 5)  Nexium 40 Mg Cpdr (Esomeprazole Magnesium) .... Take 1 Capsule By Mouth Once A Day 6)  Omeprazole 20 Mg Cpdr (Omeprazole) .... Take 1 Capsule By Mouth Two Times A Day, For 10 Days , Then As Needed 7)  Fish Oil 1200 Mg Caps (Omega-3 Fatty Acids) .... Take 1 Tablet By Mouth Once A Day  Allergies (verified): No Known Drug Allergies  Review of Systems      See HPI Eyes:   Complains of vision loss-both eyes; denies blurring and discharge. CV:  Denies chest pain or discomfort, near fainting, and swelling of feet. Resp:  Denies cough and sputum productive. GI:  Denies abdominal pain, constipation, diarrhea, nausea, and vomiting. GU:  Denies dysuria, incontinence, and urinary frequency. MS:  Complains of low back pain. Neuro:  Denies memory loss, poor balance, tingling, tremors, visual disturbances, and weakness. Endo:  Denies cold intolerance, excessive hunger, excessive thirst, and excessive urination. Heme:  Denies abnormal bruising and bleeding. Allergy:  Denies hives or rash and itching eyes.  Physical Exam  General:  Well-developed,well-nourished,in no acute distress; alert,appropriate and cooperative throughout examination. HEENT: No facial asymmetry,  EOMI, No sinus tenderness, TM's Clear,ropharynx  pink and moist.   Chest: Clear to auscultation bilaterally.  CVS: S1, S2, No murmurs, No S3.   Abd: Soft, Nontender.  MS: decreased  ROM spine,adequatein  hips, shoulders and knees.  Ext: No edema.   CNS: CN 2-12 intact, power tone and sensation normal throughout.   Skin: Intact, no visible lesions or rashes.  Psych: Good eye contact, normal affect.  Memory intact, not anxious or depressed appearing.    Impression & Recommendations:  Problem # 1:  BACK PAIN (ICD-724.5) Assessment Unchanged  Her updated medication list for this problem includes:  Advil 200 Mg Tabs (Ibuprofen) .Marland KitchenMarland KitchenMarland KitchenMarland Kitchen 3 tablets twice daily for 7 days  Orders: Ketorolac-Toradol 15mg  (X9147) Admin of Therapeutic Inj  intramuscular or subcutaneous (82956) Pain Clinic Referral (Pain)  Problem # 2:  OTHER SPECIFIED TYPES OF CYSTITIS (ICD-595.89) Assessment: Comment Only septra to be used as needed  Complete Medication List: 1)  Calcium 1200mg  W/ Vitamin D 1000iu  .... Take 1 tablet by mouth once a day 2)  Calcium 1200/vitamin D 1000  .... Take 1 tablet by mouth once a day 3)   Advil 200 Mg Tabs (Ibuprofen) .... 3 tablets twice daily for 7 days 4)  Nexium 40 Mg Cpdr (Esomeprazole magnesium) .... Take 1 capsule by mouth once a day 5)  Omeprazole 20 Mg Cpdr (Omeprazole) .... Take 1 capsule by mouth two times a day, for 10 days , then as needed 6)  Fish Oil 1200 Mg Caps (Omega-3 fatty acids) .... Take 1 tablet by mouth once a day 7)  Septra Ds 800-160 Mg Tabs (Sulfamethoxazole-trimethoprim) .... Take 1 tablet by mouth two times a day as needed  Patient Instructions: 1)  pt to call and schedule  her CPE when ready. 2)  Toradol 60mg  im today. 3)  You need appt with Dr Eduard Clos asap, we will schedule. 4)  oK to take advil 200mg  twice daily Prescriptions: SEPTRA DS 800-160 MG TABS (SULFAMETHOXAZOLE-TRIMETHOPRIM) Take 1 tablet by mouth two times a day as needed  #30 x 3   Entered and Authorized by:   Syliva Overman MD   Signed by:   Syliva Overman MD on 12/02/2010   Method used:   Electronically to        [facility] [phone]* (retail)       [address]       [place], [place]  [address]       Ph: [phone]       Fax: [phone]   RxID:   [id number]    Medication Administration  Injection # 1:    Medication: Ketorolac-Toradol 15mg     Diagnosis: BACK PAIN (ICD-724.5)    Route: IM    Site: LUOQ gluteus    Exp Date: 12/13    Lot #: [id number]    Mfr: Wockhardt    Comments: 60mg  given    Patient tolerated injection without complications    Given by: Adella Hare LPN (December 02, 3327 11:45 AM)  Orders Added: 1)  Est. Patient Level III [51884] 2)  Ketorolac-Toradol 15mg  [J1885] 3)  Admin of Therapeutic Inj  intramuscular or subcutaneous [96372] 4)  Pain Clinic Referral [Pain]     Medication Administration  Injection # 1:    Medication: Ketorolac-Toradol 15mg     Diagnosis: BACK PAIN (ICD-724.5)    Route: IM    Site: LUOQ gluteus    Exp Date: 12/13    Lot #: [id number]    Mfr: Wockhardt    Comments: 60mg  given    Patient  tolerated injection without complications    Given by: Adella Hare LPN (December 02, 1599 11:45 AM)  Orders Added: 1)  Est. Patient Level III [09323] 2)  Ketorolac-Toradol 15mg  [J1885] 3)  Admin of Therapeutic Inj  intramuscular or subcutaneous [96372] 4)  Pain Clinic Referral [Pain]

## 2011-01-27 ENCOUNTER — Ambulatory Visit (INDEPENDENT_AMBULATORY_CARE_PROVIDER_SITE_OTHER): Payer: PRIVATE HEALTH INSURANCE | Admitting: Cardiovascular Disease

## 2011-01-27 ENCOUNTER — Encounter: Payer: Self-pay | Admitting: Cardiovascular Disease

## 2011-01-27 VITALS — BP 122/69 | HR 60 | Resp 12 | Ht 67.0 in | Wt 132.0 lb

## 2011-01-27 DIAGNOSIS — R072 Precordial pain: Secondary | ICD-10-CM

## 2011-01-27 NOTE — Patient Instructions (Signed)
Your physician has requested that you have an echocardiogram. Echocardiography is a painless test that uses sound waves to create images of your heart. It provides your doctor with information about the size and shape of your heart and how well your heart's chambers and valves are working. This procedure takes approximately one hour. There are no restrictions for this procedure.  Your physician has requested that you have an exercise tolerance test. For further information please visit www.[email or web address]. Please also follow instruction sheet, as given.   

## 2011-01-27 NOTE — Progress Notes (Signed)
History of Present Illness:64 yo WF with history of borderline hyperlipidemia who is here today for follow up. I saw her one year ago as a  self referral for evaluation of chest pain. She told  me that she was on a ski trip in February and had onset of left sided chest heaviness while at rest. NO associated SOB or sweatiness. This was during a stressful time when she had locked herself out of the house. It only lasted for several minutes. We did not pursue an ischemic workup as the pain seemed atypical.  She returns for follow up today. She tells me that she has some chest discomfort both at rest and with exertion. This is a pressure sometimes and sometimes a sticking pain. She has also noticed SOB when climbing stairs. Occasional palpitations.   Past Medical History  Diagnosis Date  . Hyperlipidemia     borderline   . 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 Vertigo   . Mitral valve prolapse 1984  . Cystitis     Past Surgical History  Procedure Date  . Breast biopsy 1998    left - benign  . Tonsillectomy 1965  . Tubal ligation 1983  . Back surgery 2008    Current Outpatient Prescriptions  Medication Sig Dispense Refill  . Calcium Carbonate-Vit D-Min (CALCIUM 1200) 1200-1000 MG-UNIT CHEW Chew by mouth. Take one tablet by mouth once a day       . Multiple Vitamin (MULTIVITAMIN) capsule Take 1 capsule by mouth daily.        . Omega-3 Fatty Acids (FISH OIL) 1200 MG CAPS 1 tab po qd       . sulfamethoxazole-trimethoprim (BACTRIM DS) 800-160 MG per tablet Take 1 tablet by mouth as needed.        Marland Kitchen DISCONTD: Calcium Carb-Cholecalciferol (OYSCO 500+D) 500-600 MG-UNIT CHEW Chew by mouth. One chew three times a day       . DISCONTD: esomeprazole (NEXIUM) 40 MG capsule Take 40 mg by mouth daily before breakfast. Take one capsule by mouth once a day       . DISCONTD: ibuprofen (ADVIL,MOTRIN) 200 MG tablet Take 200 mg by mouth every 6 (six) hours as needed. Take three tablets by mouth twice daily  for 7 days       . DISCONTD: omeprazole (PRILOSEC) 20 MG capsule Take 20 mg by mouth daily. Take one capsule by mouth two times a day, for 10 days, then as needed       . DISCONTD: predniSONE, Pak, (STERAPRED) 5 MG TABS Take 5 mg by mouth. Use as directed       . DISCONTD: sulfamethoxazole-trimethoprim (BACTRIM DS) 800-160 MG per tablet Take 1 tablet by mouth 2 (two) times daily. take one tablet by mouth two times a day as needed         Allergies not on file  History   Social History  . Marital Status: Married    Spouse Name: N/A    Number of Children: N/A  . Years of Education: N/A   Occupational History  . Not on file.   Social History Main Topics  . Smoking status: Former Games developer  . Smokeless tobacco: Not on file  . Alcohol Use: Yes     socially   . Drug Use: No  . Sexually Active: Not on file   Other Topics Concern  . Not on file   Social History Narrative  . No narrative on file    Family  History  Problem Relation Age of Onset  . Heart attack Mother     used tobacco   . Arthritis Mother     severe   . Heart attack Father     used tobacco   . Alcohol abuse Father   . Cancer Brother     prostate   . Osteoporosis Sister     severe     Review of Systems:  As stated in the HPI and otherwise negative.   BP 122/69  Pulse 60  Resp 12  Ht 5\' 7"  (1.702 m)  Wt 132 lb (59.875 kg)  BMI 20.67 kg/m2  Physical Examination: General: Well developed, well nourished, NAD HEENT: OP clear, mucus membranes moist SKIN: warm, dry. No rashes. Neuro: No focal deficits Musculoskeletal: Muscle strength 5/5 all ext Psychiatric: Mood and affect normal Neck: No JVD, no carotid bruits, no thyromegaly, no lymphadenopathy. Lungs:Clear bilaterally, no wheezes, rhonci, crackles Cardiovascular: Regular rate and rhythm. No murmurs, gallops or rubs. Abdomen:Soft. Bowel sounds present. Non-tender.  Extremities: No lower extremity edema. Pulses are 2 + in the bilateral  DP/PT.  EKG: NSR, rate 72 bpm. Normal EKG.

## 2011-01-27 NOTE — Assessment & Plan Note (Signed)
Will get echo to exclude structural heart disease and arrange treadmill stress test to exclude ischemia.

## 2011-02-10 NOTE — Op Note (Signed)
NAMEMARGE, VANDERMEULEN              ACCOUNT NO.:  [id number]   MEDICAL RECORD NO.:  [id number]          PATIENT TYPE:  OIB   LOCATION:  3041                         FACILITY:  [facility]   PHYSICIAN:  Payton Doughty, M.D.      DATE OF BIRTH:  December 08, 1945   DATE OF PROCEDURE:  07/29/2007  DATE OF DISCHARGE:  07/30/2007                               OPERATIVE REPORT   PREOPERATIVE DIAGNOSIS:  Herniated disk on the left side at L5-S1.   POSTOPERATIVE DIAGNOSIS:  Herniated disk on the left side at L5-S1.   PROCEDURE:  Left L5-S1 laminectomy/diskectomy.   SURGEON:  Payton Doughty, M.D.   ANESTHESIA:  General endotracheal.   PREP:  Prepped with alcohol wipe.   COMPLICATIONS:  None.   NEURO ASSISTANT:  [place].   DOCTOR ASSISTANT:  Phoebe Perch.   PROCEDURE IN DETAIL:  A 65 year old right-handed white lady with a left  S1 radiculopathy related to herniated disk at L5-S1, taken to the  operating room and smoothly ________ , intubated, placed prone on the  operating table.  Following shave, prep and drape in the usual sterile  fashion skin was infiltrated with 1% lidocaine with 1:400,000  epinephrine.  Skin was incised from mid L5 to mid S1 and the lamina of  L5 and S1 were exposed on the left side in the subperiosteal plane.  Intraoperative x-ray confirmed correctness level and having confirmed  correctness level hemi-semilaminectomy was carried out to the top of  ligamentum flavum using the drill and a Kerrison punch.  The ligament  was removed in retrograde fashion and the lateral aspect of the thecal  sac identified.  The lateral aspect of the left S1 root was dissected  free and nerve root retracted medially.  Immediately underneath it was a  free fragment of herniated disk that was grasped and removed without  difficulty.  The disk space was explored and all graspable fragments  removed.  Wound was irrigated.  Hemostasis assured.  Successive layers  of 0 Vicryl, 2-0 Vicryl, 3-0 nylon were  used to close.  Betadine and  Telfa dressing was applied and the patient returned to the recovery room  in good condition.           ______________________________  Payton Doughty, M.D.     MWR/MEDQ  D:  07/29/2007  T:  07/31/2007  Job:  [id number]

## 2011-02-10 NOTE — H&P (Signed)
Amanda Harmon, RIDINGER              ACCOUNT NO.:  [id number]   MEDICAL RECORD NO.:  [id number]          PATIENT TYPE:  OIB   LOCATION:  3041                         FACILITY:  [facility]   PHYSICIAN:  Payton Doughty, M.D.      DATE OF BIRTH:  August 27, 1946   DATE OF ADMISSION:  07/29/2007  DATE OF DISCHARGE:  07/30/2007                              HISTORY & PHYSICAL   ADMISSION DIAGNOSIS:  Herniated disk L5-S1 on the left.   A very nice 65 year old right-handed white lady in April was carrying a  suitcases, had the onset of back pain, progressed to the middle jaw, got  pain in her back and in her left leg.  MR showed a disk at 5-1.  She has  had PT and epidural steroids, did not get getting better and is now  admitted for laminectomy, diskectomy and left sided L5-S1.   MEDICAL HISTORY:  Benign.  She is on Actonel 35 mg a week and Neurontin  300 mg a day for pain.   ALLERGIES:  NONE.   SURGICAL HISTORY:  Tubal ligation in 1984 and tonsillectomy in 1965.   SOCIAL HISTORY:  She does not smoke, drinks socially, is a former  Runner, broadcasting/film/video.   FAMILY HISTORY:  Mom deceased with her rheumatoid arthritis.   REVIEW OF SYSTEMS:  Remarkable for back and leg pain.  HEENT:  Exam  within normal limits.  NECK:  She has reasonable range of motion in  neck.  Chest clear.  CARDIAC:  Mid-systolic click.  ABDOMEN:  No  hepatosplenomegaly.  EXTREMITIES:  Without clubbing or cyanosis.  GU:  Exam is deferred.  Peripheral pulses are good.  NEUROLOGICAL:  She is  awake, alert and oriented.  Cranial nerves are intact.  Motor exam shows  5/5 strength throughout the upper and lower extremities, save for give a  little plantar flexion weakness on her left side.  She has diminished  ankle jerk at 1 on the left, compared to the right which is 2 and  positive straight leg raise on the left.  Reverse straight leg raise is  also positive.  MR demonstrates a disk at 5-1, essentially rough with  elevation of  left S1 root.   CLINICAL IMPRESSION:  Herniated disk at L5-S1, has failed conservative  management.   PLAN:  For an L5-S1 laminectomy, diskectomy on the left side.  The risks  and benefits has been discussed, and she wishes proceed.   .           ______________________________  Payton Doughty, M.D.     MWR/MEDQ  D:  07/29/2007  T:  07/31/2007  Job:  [id number]

## 2011-02-12 ENCOUNTER — Encounter: Payer: Self-pay | Admitting: Cardiovascular Disease

## 2011-02-13 NOTE — Op Note (Signed)
Amanda Harmon, Amanda Harmon              ACCOUNT NO.:  [id number]   MEDICAL RECORD NO.:  [id number]          PATIENT TYPE:  AMB   LOCATION:  DAY                           FACILITY:  [facility]   PHYSICIAN:  R. Roetta Sessions, M.D. DATE OF BIRTH:  [birth year]   DATE OF PROCEDURE:  10/17/2004  DATE OF DISCHARGE:                                 OPERATIVE REPORT   ADDENDUM:  [name] just had an incomplete colonoscopy.  I was unable to  negotiate the entire colon for reasons outlined in the procedure note.   I was reminded that this had already had a barium enema prior to  colonoscopy, which I had previously reviewed.  She had diffuse changes in  her colon consistent with lymphoid hyperplasia; however, there were no  upstream concerning filling defects.  I re-reviewed the barium enema today  with Dr. __________.  Consequently, I suspect in retrospect, those findings  were related to inflammatory/reactive process related to the diarrhea and,  again, [name]'s bowel function is back to baseline.  Coupled with  today's procedure findings, I have recommended to [name] and her family  that no further GI evaluation is warranted, and she should return for a  screening colonoscopy in 10 years.      RMR/MEDQ  D:  10/17/2004  T:  10/17/2004  Job:  [id number]   cc:   Melvyn Novas, MD  [address] [place]  [place] [address]  Fax: [phone]

## 2011-02-13 NOTE — Op Note (Signed)
Amanda Harmon, Amanda Harmon              ACCOUNT NO.:  [id number]   MEDICAL RECORD NO.:  [id number]          PATIENT TYPE:  AMB   LOCATION:  DAY                           FACILITY:  [facility]   PHYSICIAN:  R. Roetta Sessions, M.D. DATE OF BIRTH:  1945/11/01   DATE OF PROCEDURE:  10/17/2004  DATE OF DISCHARGE:                                 OPERATIVE REPORT   PROCEDURE:  Colonoscopy (incomplete).   ENDOSCOPIST:  Gerrit Friends. Rourk, M.D.   INDICATIONS FOR PROCEDURE:  The patient is a 65 year old lady with recent,  protracted diarrheal illness since she was seen in the office on 09/15/2004  her diarrhea has resolved.  She has gone back to 1 bowel movement daily.  She never had a complete colonoscopy.  She has had sigmoidoscopies, in the  past, in [place].  Colonoscopy is now being done. This approach has been  discussed with the patient at length.  The potential risks, benefits, and  alternatives have been reviewed; questions answered.  She is agreeable.  Please see the documentation in the medical record for more information.   PROCEDURE NOTE:  O2 saturation, blood pressure, pulse and respirations were  monitored throughout the entire procedure.  Conscious sedation: Versed 3 mg IV, Demerol 75 mg IV in divided doses.  SB  prophylaxis ampicillin 2 gm IV, Garamycin 85 mg IV.   INSTRUMENT:  Olympus video chip system.   FINDINGS:  Digital rectal exam revealed no abnormalities.   ENDOSCOPIC FINDINGS:  The prep was excellent.   RECTUM:  Examination of the rectal mucosa including the retroflex view of  the anal verge revealed no abnormalities.   COLON:  The colonic mucosa was surveyed from the rectosigmoid junction in a  nice 1:1 fashion up into the more proximal left colon perhaps even more  proximally; however, the colon appeared to be externally pinched with  narrowing of the lumen upstream through which I was not able to negotiate  the scope after changing the patient's position and applying  some external  abdominal pressure.  The mucosa appeared entirely normal to this level; and  from this level the scope was slowly withdrawn and all previously mentioned  mucosal surfaces were again seen. The colonic which was seen appeared  entirely normal.   The patient tolerated the procedure well and was reacted in endoscopy.   IMPRESSION:  1.  Normal rectum.  2.  Incomplete examination as described above.   RECOMMENDATIONS:  Will complement today's examination with an air contrast  barium enema.  The image from the proximal colon not seen today.  Will plan  for next week.  Further recommendations to follow.      RMR/MEDQ  D:  10/17/2004  T:  10/17/2004  Job:  [id number]   cc:   Melvyn Novas, MD  [address]  [place]  [place] [address]  Fax: [phone]   R. Roetta Sessions, M.D.  [address]  [place]  [place] [address]

## 2011-02-13 NOTE — Consult Note (Signed)
Amanda Harmon, Amanda Harmon              ACCOUNT NO.:  [id number]   MEDICAL RECORD NO.:  [id number]          PATIENT TYPE:  AMB   LOCATION:  DAY                           FACILITY:  [facility]   PHYSICIAN:  R. Roetta Sessions, M.D. DATE OF BIRTH:  [birth year]   DATE OF CONSULTATION:  DATE OF DISCHARGE:  08/01/2004                                   CONSULTATION   REFERRING PHYSICIAN:  Melvyn Novas, MD   REASON FOR CONSULTATION:  Colonoscopy, diarrhea.   HISTORY OF PRESENT ILLNESS:  Amanda Harmon is a 65 year old Caucasian female  patient of Dr. Janna Arch.  Around mid-October she developed a 2-1/2 week  history of diarrhea.  She notes she was having multiple stools, usually in  the evenings, two or three times per night that were loose and watery, and  she did not notice any association with any particular foods although she  did remove dairy products and try a vegan diet, which did help some with her  symptoms.  She denies any associated fever or chills.  She did have  abdominal bloating, gas, and bilateral lower quadrant abdominal pain with  the diarrhea as well, and she also notes mucus in her stools.  She is now  having usually daily bowel movements, although she notes at times some  intermittent diarrhea persists.  She also notes a small amount of bright red  rectal bleeding noted on the toilet paper and notes a history of  hemorrhoidal disease.   As far as upper GI complaints, she denies any heartburn, indigestion,  regurgitation, dysphagia, or odynophagia.   She did undergo air contrast barium enema from Dr. Otilio Saber office.  She  was found to have mild lymphoid hyperplasia throughout the colon.  No  evidence of polyps or masses and no evidence of diverticulosis.   PAST MEDICAL HISTORY:  1.  Mitral valve prolapse.  2.  History of frequent urinary tract infections.  3.  Tonsillectomy in 1965.  4.  Tubal ligation.   CURRENT MEDICATIONS:  Septra DS post intercourse for  UTIs.   ALLERGIES:  No known drug allergies.   FAMILY HISTORY:  No known family history of colorectal carcinoma or liver or  chronic GI problems.  Mother age 46 and deceased secondary to CHF.  Father  deceased at age 9 secondary to CHF.  She has one sister with arthritis and  osteoporosis, one brother with arthritis.   SOCIAL HISTORY:  Amanda Harmon has been married for 35 years.  She has three  grown, healthy children.  She is employed as a [employer]ar[employer] manager.  She  reports a remote five-year history of tobacco use.  Denies any drug use.  Reports alcohol use about once a month.   REVIEW OF SYSTEMS:  CONSTITUTIONAL:  Weight is down about 6 pounds.  She  denies any fever or chills.  Denies any night sweats.  HEENT:  She has had  an intermittent sore throat as well as intermittent laryngitis.  CARDIOVASCULAR:  Denies any chest pain or palpitations.  PULMONARY:  Denies  any shortness of breath, dyspnea, cough, or hemoptysis.  GASTROINTESTINAL:  See HPI.   PHYSICAL EXAMINATION:  VITAL SIGNS:  Weight 124.5 pounds, height is 67  inches.  Temperature 97.7, blood pressure 128/82, pulse 76.  GENERAL:  Amanda Harmon is a 65 year old Caucasian female who is alert and  oriented and in no acute distress.  HEENT:  Sclerae clear, nonicteric.  Conjunctivae pink.  Oropharynx pink and  moist without any lesions.  NECK:  Supple without masses or thyromegaly.  CARDIAC:  Heart regular rate and rhythm, no S1, S2, without murmurs, clicks,  rubs, or gallops.  Lungs clear to auscultation bilaterally.  ABDOMEN:  Positive bowel sounds x4.  No bruits auscultated.  Soft,  nontender, nondistended, without palpable mass or hepatosplenomegaly.  No  rebound tenderness or guarding.  RECTAL:  Deferred.  EXTREMITIES:  2+ pedal pulses bilaterally, no edema.  SKIN:  Pink, warm and dry without any rash or jaundice.   LABORATORY DATA:  See HPI.   IMPRESSION:  Amanda Harmon is a 65 year old Caucasian female with a 2-1/2  week  history of significant diarrhea followed by weeks of abdominal bloating and  cramping with intermittent diarrhea.  She was also noted to have some mucus  in her stools as well as a six-pound weight loss.  Given her age, would  recommend further evaluation ultimately to rule out colorectal carcinoma,  also look for inflammatory bowel disease.  It is possible she could have  lactose intolerance or celiac disease as well.   RECOMMENDATIONS:  1.  Will schedule colonoscopy with Dr. Jena Gauss in the near future.  I have      discussed the procedure, including the risks and benefits including but      not limited to bleeding, infection, perforation, drug reaction.  She      agrees, and informed consent will be obtained.  2.  Dr. Jena Gauss to review air contrast barium enema regarding lymphoid      hyperplasia.  3.  Further recommendations pending procedure.   I would like to thank Dr. Janna Arch for allowing [place] to participate in the  care of Amanda Harmon.     Kand   KC/MEDQ  D:  09/15/2004  T:  09/16/2004  Job:  [id number]   cc:   Melvyn Novas, MD  [phone] [place]  [place] [address]  Fax: [phone]

## 2011-02-17 ENCOUNTER — Ambulatory Visit (HOSPITAL_COMMUNITY): Payer: No Typology Code available for payment source | Attending: Family Medicine

## 2011-02-17 ENCOUNTER — Ambulatory Visit (INDEPENDENT_AMBULATORY_CARE_PROVIDER_SITE_OTHER): Payer: PRIVATE HEALTH INSURANCE | Admitting: Cardiovascular Disease

## 2011-02-17 DIAGNOSIS — R079 Chest pain, unspecified: Secondary | ICD-10-CM

## 2011-02-17 DIAGNOSIS — I059 Rheumatic mitral valve disease, unspecified: Secondary | ICD-10-CM | POA: Insufficient documentation

## 2011-02-17 DIAGNOSIS — R072 Precordial pain: Secondary | ICD-10-CM

## 2011-02-17 DIAGNOSIS — R42 Dizziness and giddiness: Secondary | ICD-10-CM | POA: Insufficient documentation

## 2011-02-17 DIAGNOSIS — I379 Nonrheumatic pulmonary valve disorder, unspecified: Secondary | ICD-10-CM | POA: Insufficient documentation

## 2011-02-17 NOTE — Progress Notes (Signed)
Exercise Treadmill Test  Pre-Exercise Testing Evaluation Rhythm: normal sinus  Rate: 73   PR:  .16 QRS:  .08  QT:  .36 QTc: .40     Test  Exercise Tolerance Test Ordering MD: Melene Muller, MD  Interpreting MD:  Melene Muller, MD  Unique Test No: 1  Treadmill:  1  Indication for ETT: chest pain - rule out ischemia  Contraindication to ETT: No   Stress Modality: exercise - treadmill  Cardiac Imaging Performed: non   Protocol: standard Bruce - maximal  Max BP: 178/78  Max MPHR (bpm):  156 85% MPR (bpm):  132  MPHR obtained (bpm):  157 % MPHR obtained:  101%  Reached 85% MPHR (min:sec):  4:27 Total Exercise Time (min-sec):  9:13  Workload in METS:  10.4 Borg Scale: 15  Reason ETT Terminated:  fatigue    ST Segment Analysis At Rest: normal ST segments - no evidence of significant ST depression With Exercise: no evidence of significant ST depression  Other Information Arrhythmia:  No Angina during ETT:  absent (0) Quality of ETT:  non-diagnostic  ETT Interpretation:  normal - no evidence of ischemia by ST analysis  Comments: Pt exercised for 9 minutes, 13 seconds and reached target heart rate. No chest pain or EKG evidence of ischemia. Good exercise tolerance.   Recommendations: No further ischemic workup.

## 2011-06-09 ENCOUNTER — Telehealth: Payer: Self-pay

## 2011-06-09 NOTE — Telephone Encounter (Signed)
Advise \\ED  today o, since she is so symptomatic new severe headache ED is the safest route since  nothing is helping and she has not had this type of headache before. If she refuses I will work her in tomorrow in the afternoon

## 2011-06-09 NOTE — Telephone Encounter (Signed)
Patient aware.

## 2011-06-09 NOTE — Telephone Encounter (Signed)
Has had a pounding headache since early morning and its getting worse even after taking Advil. Now its pounding and the top of her throat is sore. She has no congestion or drainage and denied fever chills or bodyaches. Wants to be seen as soon as possible. Advised no openings today from [place].When do you want to see her or advise urgent care?

## 2011-07-08 LAB — URINALYSIS, ROUTINE W REFLEX MICROSCOPIC
Glucose, UA: NEGATIVE
Hgb urine dipstick: NEGATIVE
Specific Gravity, Urine: 1.021
pH: 8.5 — ABNORMAL HIGH

## 2011-07-08 LAB — CBC
HCT: 44.9
Hemoglobin: 15.1 — ABNORMAL HIGH
MCV: 93.6
RBC: 4.8
WBC: 7

## 2011-07-08 LAB — COMPREHENSIVE METABOLIC PANEL
Alkaline Phosphatase: 58
BUN: 8
Calcium: 10.7 — ABNORMAL HIGH
Glucose, Bld: 90
Potassium: 4.9
Total Protein: 6.6

## 2011-07-08 LAB — DIFFERENTIAL
Basophils Relative: 1
Monocytes Relative: 9
Neutro Abs: 5
Neutrophils Relative %: 71

## 2011-07-08 LAB — APTT: aPTT: 32

## 2011-07-08 LAB — PROTIME-INR
INR: 0.9
Prothrombin Time: 12.1

## 2011-10-19 ENCOUNTER — Other Ambulatory Visit: Payer: Self-pay | Admitting: Family Medicine

## 2011-10-19 DIAGNOSIS — Z139 Encounter for screening, unspecified: Secondary | ICD-10-CM

## 2011-10-20 DIAGNOSIS — L821 Other seborrheic keratosis: Secondary | ICD-10-CM | POA: Diagnosis not present

## 2011-10-20 DIAGNOSIS — L659 Nonscarring hair loss, unspecified: Secondary | ICD-10-CM | POA: Diagnosis not present

## 2011-10-22 ENCOUNTER — Ambulatory Visit (HOSPITAL_COMMUNITY)
Admission: RE | Admit: 2011-10-22 | Discharge: 2011-10-22 | Disposition: A | Discharge status: Home / Self Care | Payer: Medicare Other | Source: Ambulatory Visit | Attending: Family Medicine | Admitting: Family Medicine

## 2011-10-22 DIAGNOSIS — Z1231 Encounter for screening mammogram for malignant neoplasm of breast: Secondary | ICD-10-CM | POA: Diagnosis not present

## 2011-10-22 DIAGNOSIS — Z139 Encounter for screening, unspecified: Secondary | ICD-10-CM

## 2011-10-27 DIAGNOSIS — M79609 Pain in unspecified limb: Secondary | ICD-10-CM | POA: Diagnosis not present

## 2011-10-27 DIAGNOSIS — Q828 Other specified congenital malformations of skin: Secondary | ICD-10-CM | POA: Diagnosis not present

## 2012-03-23 ENCOUNTER — Encounter: Payer: Self-pay | Admitting: Family Medicine

## 2012-03-23 ENCOUNTER — Ambulatory Visit (INDEPENDENT_AMBULATORY_CARE_PROVIDER_SITE_OTHER): Payer: Medicare Other | Admitting: Family Medicine

## 2012-03-23 ENCOUNTER — Other Ambulatory Visit (HOSPITAL_COMMUNITY)
Admission: RE | Admit: 2012-03-23 | Discharge: 2012-03-23 | Disposition: A | Discharge status: Home / Self Care | Payer: Medicare Other | Source: Ambulatory Visit | Attending: Family Medicine | Admitting: Family Medicine

## 2012-03-23 VITALS — BP 130/90 | HR 78 | Resp 18 | Ht 67.0 in | Wt 134.1 lb

## 2012-03-23 DIAGNOSIS — M549 Dorsalgia, unspecified: Secondary | ICD-10-CM

## 2012-03-23 DIAGNOSIS — Z124 Encounter for screening for malignant neoplasm of cervix: Secondary | ICD-10-CM

## 2012-03-23 DIAGNOSIS — Z01419 Encounter for gynecological examination (general) (routine) without abnormal findings: Secondary | ICD-10-CM | POA: Diagnosis not present

## 2012-03-23 DIAGNOSIS — Z23 Encounter for immunization: Secondary | ICD-10-CM

## 2012-03-23 DIAGNOSIS — E785 Hyperlipidemia, unspecified: Secondary | ICD-10-CM | POA: Diagnosis not present

## 2012-03-23 DIAGNOSIS — M899 Disorder of bone, unspecified: Secondary | ICD-10-CM

## 2012-03-23 DIAGNOSIS — Z0000 Encounter for general adult medical examination without abnormal findings: Secondary | ICD-10-CM

## 2012-03-23 DIAGNOSIS — R5381 Other malaise: Secondary | ICD-10-CM | POA: Diagnosis not present

## 2012-03-23 DIAGNOSIS — R5383 Other fatigue: Secondary | ICD-10-CM

## 2012-03-23 DIAGNOSIS — Z1211 Encounter for screening for malignant neoplasm of colon: Secondary | ICD-10-CM | POA: Diagnosis not present

## 2012-03-23 DIAGNOSIS — D239 Other benign neoplasm of skin, unspecified: Secondary | ICD-10-CM

## 2012-03-23 DIAGNOSIS — Z13820 Encounter for screening for osteoporosis: Secondary | ICD-10-CM

## 2012-03-23 DIAGNOSIS — M818 Other osteoporosis without current pathological fracture: Secondary | ICD-10-CM

## 2012-03-23 DIAGNOSIS — D229 Melanocytic nevi, unspecified: Secondary | ICD-10-CM

## 2012-03-23 DIAGNOSIS — N3080 Other cystitis without hematuria: Secondary | ICD-10-CM

## 2012-03-23 LAB — POC HEMOCCULT BLD/STL (OFFICE/1-CARD/DIAGNOSTIC): Fecal Occult Blood, POC: NEGATIVE

## 2012-03-23 NOTE — Patient Instructions (Addendum)
F/U in 1 year or as needed, pt later advised to return in 6 months due to elevated diastolic blood pressure at 90   Fasting CBC, lipid, chem 7, TSH, Vit D asap  Please start calcium with D 1200mg /1000Iu once daily.Continue multivitamin one daily You will be referred to Dr Karilyn Cota for colonoscopy, and to dermatology for eval of moles   You will be referred for dexa  Please start some strength training  Pneumonia vaccine today  Please check with your insurance re coverage for TdAP

## 2012-03-24 ENCOUNTER — Telehealth: Payer: Self-pay | Admitting: Family Medicine

## 2012-03-24 ENCOUNTER — Other Ambulatory Visit: Payer: Self-pay | Admitting: Family Medicine

## 2012-03-24 DIAGNOSIS — E039 Hypothyroidism, unspecified: Secondary | ICD-10-CM

## 2012-03-24 DIAGNOSIS — R5383 Other fatigue: Secondary | ICD-10-CM | POA: Diagnosis not present

## 2012-03-24 DIAGNOSIS — Z13820 Encounter for screening for osteoporosis: Secondary | ICD-10-CM | POA: Diagnosis not present

## 2012-03-24 DIAGNOSIS — M899 Disorder of bone, unspecified: Secondary | ICD-10-CM | POA: Diagnosis not present

## 2012-03-24 DIAGNOSIS — E785 Hyperlipidemia, unspecified: Secondary | ICD-10-CM | POA: Diagnosis not present

## 2012-03-24 DIAGNOSIS — R5381 Other malaise: Secondary | ICD-10-CM | POA: Diagnosis not present

## 2012-03-24 LAB — CBC
HCT: 37.2 % (ref 36.0–46.0)
Hemoglobin: 13 g/dL (ref 12.0–15.0)
MCV: 86.7 fL (ref 78.0–100.0)
WBC: 5.1 10*3/uL (ref 4.0–10.5)

## 2012-03-24 LAB — LIPID PANEL
HDL: 64 mg/dL (ref >39)
LDL Cholesterol: 129 mg/dL — ABNORMAL HIGH (ref 0–99)
Total CHOL/HDL Ratio: 3.3 Ratio
Triglycerides: 90 mg/dL (ref <150)
VLDL: 18 mg/dL (ref 0–40)

## 2012-03-24 LAB — BASIC METABOLIC PANEL
CO2: 30 mEq/L (ref 19–32)
Calcium: 9.6 mg/dL (ref 8.4–10.5)
Creat: 0.62 mg/dL (ref 0.50–1.10)

## 2012-03-24 LAB — TSH: TSH: 6.152 u[IU]/mL — ABNORMAL HIGH (ref 0.350–4.500)

## 2012-03-24 NOTE — Telephone Encounter (Signed)
Not due till 2016 unless there are problems, ths colonoscopy so ok to cancel

## 2012-03-24 NOTE — Telephone Encounter (Signed)
Was done 1.20.2006 the other was her first visit in 2005

## 2012-03-25 LAB — VITAMIN D 25 HYDROXY (VIT D DEFICIENCY, FRACTURES): Vit D, 25-Hydroxy: 37 ng/mL (ref 30–89)

## 2012-03-27 DIAGNOSIS — Z0000 Encounter for general adult medical examination without abnormal findings: Secondary | ICD-10-CM | POA: Insufficient documentation

## 2012-03-27 NOTE — Progress Notes (Signed)
  Subjective:    Patient ID: Amanda Harmon, female    DOB: 10/12/1945, 66 y.o.   MRN: [id number]  HPI The PT is here for annual exam.  Preventive health is updated, specifically  Cancer screening and Immunization.   Pt states that for the first time she is "beginning to feel her age" notices increased joint pain and stiffness. Last week, after bending she developed acute back pain which resolved with a few days of aggressive anti inflammatroy use. Needs updated Dexa, of note she has also stopped taking calcium, I advised this is necessary. Maintains good eating hablts and some regular physical activity, has not been doing any weight training and notes some muscle weakness    Review of Systems See HPI Denies recent fever or chills. Denies sinus pressure, nasal congestion, ear pain or sore throat. Denies chest congestion, productive cough or wheezing. Denies chest pains, palpitations and leg swelling Denies abdominal pain, nausea, vomiting,diarrhea or constipation.   Denies dysuria, frequency, hesitancy or incontinence. . Denies headaches, seizures, numbness, or tingling. Denies depression, anxiety or insomnia. Denies skin break down or rash.        Objective:   Physical Exam Pleasant well nourished female, alert and oriented x 3, in no cardio-pulmonary distress. Afebrile. HEENT No facial trauma or asymetry. Sinuses non tender.  EOMI, PERTL.  External ears normal, tympanic membranes clear. Oropharynx moist, no exudate, good dentition. Neck: supple, no adenopathy,JVD or thyromegaly.No bruits.  Chest: Clear to ascultation bilaterally.No crackles or wheezes. Non tender to palpation  Breast: No asymetry,no masses. No nipple discharge or inversion. No axillary or supraclavicular adenopathy  Cardiovascular system; Heart sounds normal,  S1 and  S2 ,no S3.  No murmur, or thrill. Apical beat not displaced Peripheral pulses normal.  Abdomen: Soft, non tender, no  organomegaly or masses. No bruits. Bowel sounds normal. No guarding, tenderness or rebound.  Rectal:  No mass. Guaiac negative stool.  GU: External genitalia normal. No lesions. Vaginal canal normal.No discharge. Uterus normal size, no adnexal masses, no cervical motion or adnexal tenderness.  Musculoskeletal exam: decreased  ROM of spine,adequate in  hips , shoulders and knees. No deformity ,swelling or crepitus noted. No muscle wasting or atrophy.   Neurologic: Cranial nerves 2 to 12 intact. Power, tone ,sensation and reflexes normal throughout. No disturbance in gait. No tremor.  Skin: Intact, no ulceration, erythema , scaling or rash noted. Pigmented nevi x 2 noted below right breast and lower abdomen in suprapubic area.  Psych; Normal mood and affect. Judgement and concentration normal        Assessment & Plan:

## 2012-03-27 NOTE — Assessment & Plan Note (Signed)
Pt has been non compliant with calcium and Vit D, the importance of this is stressed. Dexa to be reptd

## 2012-03-27 NOTE — Assessment & Plan Note (Signed)
New molew under ight breast with rough feel, also mole with multiple pigments on lower abd, derm to eval and treat

## 2012-03-27 NOTE — Assessment & Plan Note (Signed)
Continue septra with intercourse

## 2012-03-27 NOTE — Assessment & Plan Note (Signed)
Hyperlipidemia:Low fat diet discussed and encouraged.   

## 2012-03-27 NOTE — Assessment & Plan Note (Signed)
Immunization and cancer screening reviewed and updated. yhe commitment to daily physical activity, and a diet rich in fruit and veg is discussed and encouraged. Bone health is also addressed

## 2012-03-27 NOTE — Assessment & Plan Note (Signed)
Recent acute flare resolved with anti inflammatory

## 2012-03-29 ENCOUNTER — Ambulatory Visit (HOSPITAL_COMMUNITY)
Admission: RE | Admit: 2012-03-29 | Discharge: 2012-03-29 | Disposition: A | Discharge status: Home / Self Care | Payer: Medicare Other | Source: Ambulatory Visit | Attending: Family Medicine | Admitting: Family Medicine

## 2012-03-29 DIAGNOSIS — M949 Disorder of cartilage, unspecified: Secondary | ICD-10-CM | POA: Insufficient documentation

## 2012-03-29 DIAGNOSIS — M899 Disorder of bone, unspecified: Secondary | ICD-10-CM | POA: Insufficient documentation

## 2012-03-29 DIAGNOSIS — Z780 Asymptomatic menopausal state: Secondary | ICD-10-CM | POA: Insufficient documentation

## 2012-03-29 DIAGNOSIS — Z13820 Encounter for screening for osteoporosis: Secondary | ICD-10-CM | POA: Diagnosis not present

## 2012-04-06 ENCOUNTER — Other Ambulatory Visit: Payer: Self-pay | Admitting: Family Medicine

## 2012-04-06 DIAGNOSIS — R7989 Other specified abnormal findings of blood chemistry: Secondary | ICD-10-CM

## 2012-04-07 ENCOUNTER — Telehealth: Payer: Self-pay | Admitting: Family Medicine

## 2012-04-07 NOTE — Telephone Encounter (Signed)
Patient aware.

## 2012-04-07 NOTE — Addendum Note (Signed)
Addended by: Abner Greenspan on: 04/07/2012 01:48 PM   Modules accepted: Orders

## 2012-04-08 NOTE — Progress Notes (Signed)
noted 

## 2012-04-14 ENCOUNTER — Telehealth: Payer: Self-pay | Admitting: Family Medicine

## 2012-04-18 NOTE — Telephone Encounter (Signed)
Left message that those records would have to be collected from the hospital

## 2012-04-19 ENCOUNTER — Other Ambulatory Visit (HOSPITAL_COMMUNITY): Payer: No Typology Code available for payment source

## 2012-04-28 ENCOUNTER — Ambulatory Visit (HOSPITAL_COMMUNITY)
Admission: RE | Admit: 2012-04-28 | Discharge: 2012-04-28 | Disposition: A | Discharge status: Home / Self Care | Payer: Medicare Other | Source: Ambulatory Visit | Attending: Family Medicine | Admitting: Family Medicine

## 2012-04-28 DIAGNOSIS — R6889 Other general symptoms and signs: Secondary | ICD-10-CM | POA: Insufficient documentation

## 2012-04-28 DIAGNOSIS — E0590 Thyrotoxicosis, unspecified without thyrotoxic crisis or storm: Secondary | ICD-10-CM | POA: Diagnosis not present

## 2012-04-28 DIAGNOSIS — E041 Nontoxic single thyroid nodule: Secondary | ICD-10-CM | POA: Diagnosis not present

## 2012-04-28 DIAGNOSIS — R7989 Other specified abnormal findings of blood chemistry: Secondary | ICD-10-CM

## 2012-07-26 DIAGNOSIS — D235 Other benign neoplasm of skin of trunk: Secondary | ICD-10-CM | POA: Diagnosis not present

## 2012-07-26 DIAGNOSIS — D2360 Other benign neoplasm of skin of unspecified upper limb, including shoulder: Secondary | ICD-10-CM | POA: Diagnosis not present

## 2012-08-17 DIAGNOSIS — H2510 Age-related nuclear cataract, unspecified eye: Secondary | ICD-10-CM | POA: Diagnosis not present

## 2012-08-30 DIAGNOSIS — H25019 Cortical age-related cataract, unspecified eye: Secondary | ICD-10-CM | POA: Diagnosis not present

## 2012-08-30 DIAGNOSIS — H43399 Other vitreous opacities, unspecified eye: Secondary | ICD-10-CM | POA: Diagnosis not present

## 2012-09-13 DIAGNOSIS — Z411 Encounter for cosmetic surgery: Secondary | ICD-10-CM | POA: Diagnosis not present

## 2012-10-12 ENCOUNTER — Other Ambulatory Visit: Payer: Self-pay | Admitting: Family Medicine

## 2012-10-12 DIAGNOSIS — Z139 Encounter for screening, unspecified: Secondary | ICD-10-CM

## 2012-10-25 ENCOUNTER — Ambulatory Visit (HOSPITAL_COMMUNITY)
Admission: RE | Admit: 2012-10-25 | Discharge: 2012-10-25 | Disposition: A | Discharge status: Home / Self Care | Payer: Medicare Other | Source: Ambulatory Visit | Attending: Family Medicine | Admitting: Family Medicine

## 2012-10-25 DIAGNOSIS — Z139 Encounter for screening, unspecified: Secondary | ICD-10-CM

## 2012-10-25 DIAGNOSIS — Z1231 Encounter for screening mammogram for malignant neoplasm of breast: Secondary | ICD-10-CM | POA: Insufficient documentation

## 2013-04-24 ENCOUNTER — Other Ambulatory Visit: Payer: Self-pay | Admitting: Family Medicine

## 2013-04-26 ENCOUNTER — Telehealth: Payer: Self-pay | Admitting: Family Medicine

## 2013-04-26 NOTE — Telephone Encounter (Signed)
Records updated.

## 2013-05-01 ENCOUNTER — Other Ambulatory Visit: Payer: Self-pay | Admitting: Family Medicine

## 2013-05-02 DIAGNOSIS — Q828 Other specified congenital malformations of skin: Secondary | ICD-10-CM | POA: Diagnosis not present

## 2013-05-12 ENCOUNTER — Other Ambulatory Visit: Payer: Self-pay

## 2013-05-12 ENCOUNTER — Telehealth: Payer: Self-pay | Admitting: Family Medicine

## 2013-05-12 MED ORDER — SULFAMETHOXAZOLE-TMP DS 800-160 MG PO TABS: ORAL_TABLET | ORAL | Status: DC

## 2013-05-12 NOTE — Telephone Encounter (Signed)
Sent to pharmacy requested.  Noted that patient has scheduled appt for September.

## 2013-06-08 ENCOUNTER — Ambulatory Visit (INDEPENDENT_AMBULATORY_CARE_PROVIDER_SITE_OTHER): Payer: Medicare Other | Admitting: Family Medicine

## 2013-06-08 ENCOUNTER — Encounter: Payer: Self-pay | Admitting: Family Medicine

## 2013-06-08 VITALS — BP 110/78 | HR 74 | Resp 18 | Ht 67.0 in | Wt 133.1 lb

## 2013-06-08 DIAGNOSIS — M8580 Other specified disorders of bone density and structure, unspecified site: Secondary | ICD-10-CM

## 2013-06-08 DIAGNOSIS — Z0000 Encounter for general adult medical examination without abnormal findings: Secondary | ICD-10-CM | POA: Diagnosis not present

## 2013-06-08 DIAGNOSIS — Z1211 Encounter for screening for malignant neoplasm of colon: Secondary | ICD-10-CM

## 2013-06-08 DIAGNOSIS — E785 Hyperlipidemia, unspecified: Secondary | ICD-10-CM

## 2013-06-08 DIAGNOSIS — R5381 Other malaise: Secondary | ICD-10-CM

## 2013-06-08 DIAGNOSIS — R5383 Other fatigue: Secondary | ICD-10-CM

## 2013-06-08 DIAGNOSIS — Z01419 Encounter for gynecological examination (general) (routine) without abnormal findings: Secondary | ICD-10-CM | POA: Diagnosis not present

## 2013-06-08 DIAGNOSIS — M899 Disorder of bone, unspecified: Secondary | ICD-10-CM

## 2013-06-08 DIAGNOSIS — Z23 Encounter for immunization: Secondary | ICD-10-CM

## 2013-06-08 NOTE — Patient Instructions (Addendum)
F/u in 6 month, call if you need me before  Flu vaccine today  Fasting lipid, chem 7, CBC and TSH as soon as possible   Rectal exam shows no hidden blood.  Please send me a message to ask about bone density

## 2013-06-09 DIAGNOSIS — E785 Hyperlipidemia, unspecified: Secondary | ICD-10-CM | POA: Diagnosis not present

## 2013-06-09 DIAGNOSIS — R5381 Other malaise: Secondary | ICD-10-CM | POA: Diagnosis not present

## 2013-06-09 LAB — CBC WITH DIFFERENTIAL/PLATELET
Eosinophils Absolute: 0.1 10*3/uL (ref 0.0–0.7)
Hemoglobin: 15.2 g/dL — ABNORMAL HIGH (ref 12.0–15.0)
Lymphs Abs: 1.2 10*3/uL (ref 0.7–4.0)
MCH: 30.3 pg (ref 26.0–34.0)
Monocytes Relative: 12 % (ref 3–12)
Neutro Abs: 3.2 10*3/uL (ref 1.7–7.7)
Neutrophils Relative %: 62 % (ref 43–77)
RBC: 5.01 MIL/uL (ref 3.87–5.11)

## 2013-06-09 LAB — TSH: TSH: 3.306 u[IU]/mL (ref 0.350–4.500)

## 2013-06-09 LAB — LIPID PANEL
LDL Cholesterol: 140 mg/dL — ABNORMAL HIGH (ref 0–99)
Triglycerides: 75 mg/dL (ref <150)
VLDL: 15 mg/dL (ref 0–40)

## 2013-06-09 LAB — BASIC METABOLIC PANEL
Chloride: 106 mEq/L (ref 96–112)
Creat: 0.61 mg/dL (ref 0.50–1.10)
Sodium: 143 mEq/L (ref 135–145)

## 2013-06-14 ENCOUNTER — Telehealth: Payer: Self-pay | Admitting: Family Medicine

## 2013-06-14 NOTE — Telephone Encounter (Signed)
SPOKE WITH PATIENT REGARDING LAB RESULTS

## 2013-07-19 ENCOUNTER — Telehealth: Payer: Self-pay | Admitting: Family Medicine

## 2013-07-19 NOTE — Telephone Encounter (Signed)
Please contact pt and let her know that i reviewed her dexa scan reports, the interpretation is that there has been no significant change between 2009 to 2013 as far as progression of bone thinning when the films were compared. His is the official reading of the radiologist So that is good news, she absolutely should continue regular physical activity including light weights, and also sufficient calciium and vit D intake to help to strengthen her bones

## 2013-07-19 NOTE — Assessment & Plan Note (Signed)
No significant change in dexa between 2009 and 2013, when lumbar spine and right femur compared

## 2013-07-19 NOTE — Progress Notes (Signed)
  Subjective:    Patient ID: Amanda Harmon, female    DOB: 1945-11-16, 67 y.o.   MRN: [id number]  HPI Pt in for pelvic and breast exam She is keeping well with no concerns. Keeps active, and follows a healthy dit for the most part, though does state she has been overindulging in sweets in recent times   Review of Systems See HPI Denies recent fever or chills. Denies sinus pressure, nasal congestion, ear pain or sore throat. Denies chest congestion, productive cough or wheezing. Denies chest pains, palpitations and leg swelling Denies abdominal pain, nausea, vomiting,diarrhea or constipation.   Denies dysuria, frequency, hesitancy or incontinence. Denies joint pain, swelling and limitation in mobility. Denies headaches, seizures, numbness, or tingling. Denies depression, anxiety or insomnia. Denies skin break down or rash.        Objective:   Physical Exam  Pleasant well nourished female, alert and oriented x 3, in no cardio-pulmonary distress. Afebrile. HEENT No facial trauma or asymetry. Sinuses non tender.  EOMI, PERTL, fundoscopic exam is normal, no hemorhage or exudate.  External ears normal, tympanic membranes clear. Oropharynx moist, no exudate, good dentition. Neck: supple, no adenopathy,JVD or thyromegaly.No bruits.  Chest: Clear to ascultation bilaterally.No crackles or wheezes. Non tender to palpation  Breast: No asymetry,no masses. No nipple discharge or inversion. No axillary or supraclavicular adenopathy  Cardiovascular system; Heart sounds normal,  S1 and  S2 ,no S3.  No murmur, or thrill. Apical beat not displaced Peripheral pulses normal.  Abdomen: Soft, non tender, no organomegaly or masses. No bruits. Bowel sounds normal. No guarding, tenderness or rebound.  Rectal:  No mass. Guaiac negative stool.  GU: External genitalia normal. No lesions. Vaginal canal normal.physiologic discharge. Uterus atrophic, no adnexal masses, no cervical  motion or adnexal tenderness.  Musculoskeletal exam: Full ROM of spine, hips , shoulders and knees. No deformity ,swelling or crepitus noted. No muscle wasting or atrophy.   Neurologic: Cranial nerves 2 to 12 intact. Power, tone ,sensation and reflexes normal throughout. No disturbance in gait. No tremor.  Skin: Intact, no ulceration, erythema , scaling or rash noted. Pigmentation normal throughout  Psych; Normal mood and affect. Judgement and concentration normal       Assessment & Plan:

## 2013-07-19 NOTE — Assessment & Plan Note (Signed)
Pelvic and breast  exam as documented Pt encouraged to continue regular physical activity,and she needs reduce fried and fatty food intake also. Immunization needs addressed

## 2013-07-19 NOTE — Assessment & Plan Note (Signed)
Deteriorated, needs to reduce fried and fatty food intake

## 2013-07-20 DIAGNOSIS — Z23 Encounter for immunization: Secondary | ICD-10-CM

## 2013-07-20 LAB — HEMOCCULT GUIAC POC 1CARD (OFFICE): Fecal Occult Blood, POC: NEGATIVE

## 2013-07-20 NOTE — Addendum Note (Signed)
Addended by: Kandis Fantasia B on: 07/20/2013 09:27 AM   Modules accepted: Orders

## 2013-07-20 NOTE — Telephone Encounter (Signed)
Patient aware.

## 2013-09-28 DIAGNOSIS — E079 Disorder of thyroid, unspecified: Secondary | ICD-10-CM

## 2013-09-28 HISTORY — DX: Disorder of thyroid, unspecified: E07.9

## 2013-10-23 ENCOUNTER — Other Ambulatory Visit: Payer: Self-pay | Admitting: Family Medicine

## 2013-10-23 DIAGNOSIS — Z139 Encounter for screening, unspecified: Secondary | ICD-10-CM

## 2013-10-26 ENCOUNTER — Ambulatory Visit (HOSPITAL_COMMUNITY)
Admission: RE | Admit: 2013-10-26 | Discharge: 2013-10-26 | Disposition: A | Discharge status: Home / Self Care | Payer: Medicare Other | Source: Ambulatory Visit | Attending: Family Medicine | Admitting: Family Medicine

## 2013-10-26 DIAGNOSIS — Z1231 Encounter for screening mammogram for malignant neoplasm of breast: Secondary | ICD-10-CM | POA: Insufficient documentation

## 2013-10-26 DIAGNOSIS — Z139 Encounter for screening, unspecified: Secondary | ICD-10-CM

## 2013-11-15 DIAGNOSIS — H2510 Age-related nuclear cataract, unspecified eye: Secondary | ICD-10-CM | POA: Diagnosis not present

## 2013-11-15 DIAGNOSIS — H43819 Vitreous degeneration, unspecified eye: Secondary | ICD-10-CM | POA: Diagnosis not present

## 2013-12-01 DIAGNOSIS — H269 Unspecified cataract: Secondary | ICD-10-CM | POA: Diagnosis not present

## 2013-12-01 DIAGNOSIS — H2510 Age-related nuclear cataract, unspecified eye: Secondary | ICD-10-CM | POA: Diagnosis not present

## 2013-12-01 HISTORY — PX: EYE SURGERY: SHX253

## 2013-12-15 ENCOUNTER — Other Ambulatory Visit: Payer: Self-pay

## 2013-12-15 DIAGNOSIS — H269 Unspecified cataract: Secondary | ICD-10-CM | POA: Diagnosis not present

## 2013-12-15 DIAGNOSIS — R5381 Other malaise: Secondary | ICD-10-CM

## 2013-12-15 DIAGNOSIS — R5383 Other fatigue: Principal | ICD-10-CM

## 2013-12-15 DIAGNOSIS — H2510 Age-related nuclear cataract, unspecified eye: Secondary | ICD-10-CM | POA: Diagnosis not present

## 2013-12-15 DIAGNOSIS — E785 Hyperlipidemia, unspecified: Secondary | ICD-10-CM

## 2013-12-15 HISTORY — PX: EYE SURGERY: SHX253

## 2014-02-01 DIAGNOSIS — D235 Other benign neoplasm of skin of trunk: Secondary | ICD-10-CM | POA: Diagnosis not present

## 2014-02-01 DIAGNOSIS — L259 Unspecified contact dermatitis, unspecified cause: Secondary | ICD-10-CM | POA: Diagnosis not present

## 2014-02-20 ENCOUNTER — Other Ambulatory Visit: Payer: Self-pay | Admitting: Family Medicine

## 2014-02-20 DIAGNOSIS — R5383 Other fatigue: Secondary | ICD-10-CM | POA: Diagnosis not present

## 2014-02-20 DIAGNOSIS — R946 Abnormal results of thyroid function studies: Secondary | ICD-10-CM | POA: Diagnosis not present

## 2014-02-20 DIAGNOSIS — E785 Hyperlipidemia, unspecified: Secondary | ICD-10-CM | POA: Diagnosis not present

## 2014-02-20 DIAGNOSIS — R5381 Other malaise: Secondary | ICD-10-CM | POA: Diagnosis not present

## 2014-02-20 LAB — CBC WITH DIFFERENTIAL/PLATELET
BASOS ABS: 0 10*3/uL (ref 0.0–0.1)
Basophils Relative: 1 % (ref 0–1)
Eosinophils Absolute: 0.1 10*3/uL (ref 0.0–0.7)
Eosinophils Relative: 3 % (ref 0–5)
HCT: 41.7 % (ref 36.0–46.0)
Hemoglobin: 14.2 g/dL (ref 12.0–15.0)
LYMPHS PCT: 34 % (ref 12–46)
Lymphs Abs: 1.6 10*3/uL (ref 0.7–4.0)
MCH: 30.3 pg (ref 26.0–34.0)
MCHC: 34.1 g/dL (ref 30.0–36.0)
MCV: 88.9 fL (ref 78.0–100.0)
MONOS PCT: 10 % (ref 3–12)
Monocytes Absolute: 0.5 10*3/uL (ref 0.1–1.0)
NEUTROS ABS: 2.5 10*3/uL (ref 1.7–7.7)
Neutrophils Relative %: 52 % (ref 43–77)
Platelets: 221 10*3/uL (ref 150–400)
RBC: 4.69 MIL/uL (ref 3.87–5.11)
RDW: 13.6 % (ref 11.5–15.5)
WBC: 4.8 10*3/uL (ref 4.0–10.5)

## 2014-02-20 LAB — LIPID PANEL
CHOLESTEROL: 197 mg/dL (ref 0–200)
HDL: 66 mg/dL (ref >39)
LDL Cholesterol: 115 mg/dL — ABNORMAL HIGH (ref 0–99)
TRIGLYCERIDES: 80 mg/dL (ref <150)
Total CHOL/HDL Ratio: 3 Ratio
VLDL: 16 mg/dL (ref 0–40)

## 2014-02-20 LAB — TSH: TSH: 6.117 u[IU]/mL — ABNORMAL HIGH (ref 0.350–4.500)

## 2014-02-21 LAB — T4, FREE: Free T4: 1.05 ng/dL (ref 0.80–1.80)

## 2014-02-21 LAB — T3, FREE: T3, Free: 3.2 pg/mL (ref 2.3–4.2)

## 2014-02-26 ENCOUNTER — Ambulatory Visit (INDEPENDENT_AMBULATORY_CARE_PROVIDER_SITE_OTHER): Payer: Medicare Other | Admitting: Family Medicine

## 2014-02-26 ENCOUNTER — Encounter (INDEPENDENT_AMBULATORY_CARE_PROVIDER_SITE_OTHER): Payer: Self-pay

## 2014-02-26 ENCOUNTER — Encounter: Payer: Self-pay | Admitting: Family Medicine

## 2014-02-26 VITALS — BP 118/74 | HR 80 | Resp 16 | Ht 67.0 in | Wt 133.4 lb

## 2014-02-26 DIAGNOSIS — M899 Disorder of bone, unspecified: Secondary | ICD-10-CM | POA: Diagnosis not present

## 2014-02-26 DIAGNOSIS — R946 Abnormal results of thyroid function studies: Secondary | ICD-10-CM | POA: Diagnosis not present

## 2014-02-26 DIAGNOSIS — M949 Disorder of cartilage, unspecified: Secondary | ICD-10-CM | POA: Diagnosis not present

## 2014-02-26 DIAGNOSIS — N3080 Other cystitis without hematuria: Secondary | ICD-10-CM

## 2014-02-26 DIAGNOSIS — E785 Hyperlipidemia, unspecified: Secondary | ICD-10-CM | POA: Diagnosis not present

## 2014-02-26 DIAGNOSIS — M8580 Other specified disorders of bone density and structure, unspecified site: Secondary | ICD-10-CM

## 2014-02-26 DIAGNOSIS — R7989 Other specified abnormal findings of blood chemistry: Secondary | ICD-10-CM

## 2014-02-26 DIAGNOSIS — E039 Hypothyroidism, unspecified: Secondary | ICD-10-CM | POA: Insufficient documentation

## 2014-02-26 MED ORDER — SULFAMETHOXAZOLE-TMP DS 800-160 MG PO TABS: ORAL_TABLET | ORAL | Status: DC

## 2014-02-26 NOTE — Patient Instructions (Signed)
Annual wellness exam in October , call if you need me before please  You are referred to Dr Dorris Fetch (endocrinologist) re abnormal TSH and discussion of treatment of osteopenia  Pls continue to commit to daily weight bearing  Exercise , also sufficient calcium and D intake  We will add vit D level to your lab (if possible) and let you know the result  You will get copies of your recent  Bone density test, also labs, and will sign up to "my chart" so that you have access to your medical records   Pls try to take aspirin 81 mg once daily for reduction of stroke risk  Septra will be refilled as before

## 2014-03-11 NOTE — Assessment & Plan Note (Signed)
Will refer to endo for help with osteopenia, pt has many questions as to benefit or other of calcium supplement , also the role of bisphosphonate therapy is being questioned

## 2014-03-11 NOTE — Assessment & Plan Note (Signed)
Develops UTI post intercourse , maintained on as needed septra refill sent in

## 2014-03-11 NOTE — Assessment & Plan Note (Signed)
TSH above normal , which it has been in the past . Asymptomatic as far as hypothyroid symptoms, and Free T# and Free T4 are normal Will refer to endo for further eval and management

## 2014-03-11 NOTE — Assessment & Plan Note (Signed)
Improved, pt applauded on this. She is on dietary management only and is motivated to making necessary change for improved health, continue same. Rept in 6 month

## 2014-03-11 NOTE — Progress Notes (Signed)
   Subjective:    Patient ID: Amanda Harmon, female    DOB: [birth year], 68 y.o.   MRN: [id number]  HPI The PT is here for follow up and re-evaluation of chronic medical conditions, medication management and review of any available recent lab and radiology data.  Preventive health is updated, specifically  Cancer screening and Immunization.     Has concern over elevation in TSH level, also management  Of osteopenia, will have endo consultation on both  There are no specific complaints . She continues to be diligent in healthy lifestyle to promote good health and her recent lipid profile is improved, which is great. Her weight continues to be healthy Due to excess bruising she is unable to tolerate daily tolerate, intends to try alternate day aspirin 81mg       Review of Systems See HPI Denies recent fever or chills. Denies sinus pressure, nasal congestion, ear pain or sore throat. Denies chest congestion, productive cough or wheezing. Denies chest pains, palpitations and leg swelling Denies abdominal pain, nausea, vomiting,diarrhea or constipation.   Denies dysuria, frequency, hesitancy or incontinence. Denies joint pain, swelling and limitation in mobility. Denies headaches, seizures, numbness, or tingling. Denies depression, anxiety or insomnia. Denies skin break down or rash.        Objective:   Physical Exam BP 118/74  Pulse 80  Resp 16  Ht 5\' 7"  (1.702 m)  Wt 133 lb 6.4 oz (60.51 kg)  BMI 20.89 kg/m2  SpO2 98% Patient alert and oriented and in no cardiopulmonary distress.  HEENT: No facial asymmetry, EOMI,   oropharynx pink and moist.  Neck supple no JVD, no mass.  Chest: Clear to auscultation bilaterally.  CVS: S1, S2 no murmurs, no S3.  ABD: Soft non tender.   Ext: No edema  MS: Adequate ROM spine, shoulders, hips and knees.  Skin: Intact, no ulcerations or rash noted.  Psych: Good eye contact, normal affect. Memory intact not anxious or depressed  appearing.  CNS: CN 2-12 intact, power,  normal throughout.no focal deficits noted.        Assessment & Plan:  DYSLIPIDEMIA Improved, pt applauded on this. She is on dietary management only and is motivated to making necessary change for improved health, continue same. Rept in 6 month  Abnormal TSH TSH above normal , which it has been in the past . Asymptomatic as far as hypothyroid symptoms, and Free T# and Free T4 are normal Will refer to endo for further eval and management  Osteopenia Will refer to endo for help with osteopenia, pt has many questions as to benefit or other of calcium supplement , also the role of bisphosphonate therapy is being questioned  OTHER SPECIFIED TYPES OF CYSTITIS Develops UTI post intercourse , maintained on as needed septra refill sent in

## 2014-03-27 DIAGNOSIS — H26499 Other secondary cataract, unspecified eye: Secondary | ICD-10-CM | POA: Diagnosis not present

## 2014-03-29 DIAGNOSIS — M810 Age-related osteoporosis without current pathological fracture: Secondary | ICD-10-CM | POA: Diagnosis not present

## 2014-03-29 DIAGNOSIS — E039 Hypothyroidism, unspecified: Secondary | ICD-10-CM | POA: Diagnosis not present

## 2014-04-17 DIAGNOSIS — H26499 Other secondary cataract, unspecified eye: Secondary | ICD-10-CM | POA: Diagnosis not present

## 2014-04-23 ENCOUNTER — Other Ambulatory Visit (HOSPITAL_COMMUNITY): Payer: Self-pay | Admitting: "Endocrinology

## 2014-04-23 DIAGNOSIS — M810 Age-related osteoporosis without current pathological fracture: Secondary | ICD-10-CM

## 2014-06-13 ENCOUNTER — Ambulatory Visit (HOSPITAL_COMMUNITY)
Admission: RE | Admit: 2014-06-13 | Discharge: 2014-06-13 | Disposition: A | Discharge status: Home / Self Care | Payer: Medicare Other | Source: Ambulatory Visit | Attending: "Endocrinology | Admitting: "Endocrinology

## 2014-06-13 DIAGNOSIS — M899 Disorder of bone, unspecified: Secondary | ICD-10-CM | POA: Diagnosis not present

## 2014-06-13 DIAGNOSIS — M949 Disorder of cartilage, unspecified: Secondary | ICD-10-CM | POA: Diagnosis not present

## 2014-06-13 DIAGNOSIS — M810 Age-related osteoporosis without current pathological fracture: Secondary | ICD-10-CM | POA: Diagnosis not present

## 2014-06-13 DIAGNOSIS — Z780 Asymptomatic menopausal state: Secondary | ICD-10-CM | POA: Diagnosis not present

## 2014-06-20 DIAGNOSIS — E039 Hypothyroidism, unspecified: Secondary | ICD-10-CM | POA: Diagnosis not present

## 2014-06-20 DIAGNOSIS — M810 Age-related osteoporosis without current pathological fracture: Secondary | ICD-10-CM | POA: Diagnosis not present

## 2014-06-25 DIAGNOSIS — E039 Hypothyroidism, unspecified: Secondary | ICD-10-CM | POA: Diagnosis not present

## 2014-07-04 ENCOUNTER — Encounter: Payer: Medicare Other | Admitting: Family Medicine

## 2014-10-01 ENCOUNTER — Telehealth: Payer: Self-pay

## 2014-10-01 ENCOUNTER — Other Ambulatory Visit: Payer: Self-pay | Admitting: Family Medicine

## 2014-10-01 DIAGNOSIS — R5383 Other fatigue: Secondary | ICD-10-CM

## 2014-10-01 DIAGNOSIS — R7989 Other specified abnormal findings of blood chemistry: Secondary | ICD-10-CM

## 2014-10-01 DIAGNOSIS — E785 Hyperlipidemia, unspecified: Secondary | ICD-10-CM

## 2014-10-01 LAB — LIPID PANEL
CHOLESTEROL: 223 mg/dL — AB (ref 0–200)
HDL: 69 mg/dL (ref >39)
LDL Cholesterol: 138 mg/dL — ABNORMAL HIGH (ref 0–99)
TRIGLYCERIDES: 82 mg/dL (ref <150)
Total CHOL/HDL Ratio: 3.2 Ratio
VLDL: 16 mg/dL (ref 0–40)

## 2014-10-01 LAB — BASIC METABOLIC PANEL
BUN: 12 mg/dL (ref 6–23)
CALCIUM: 9.5 mg/dL (ref 8.4–10.5)
CO2: 29 mEq/L (ref 19–32)
Chloride: 103 mEq/L (ref 96–112)
Creat: 0.59 mg/dL (ref 0.50–1.10)
Glucose, Bld: 88 mg/dL (ref 70–99)
POTASSIUM: 4.8 meq/L (ref 3.5–5.3)
SODIUM: 141 meq/L (ref 135–145)

## 2014-10-01 LAB — TSH: TSH: 1.049 u[IU]/mL (ref 0.350–4.500)

## 2014-10-01 NOTE — Telephone Encounter (Signed)
Labs ordered prior to next ov

## 2014-10-15 ENCOUNTER — Encounter: Payer: Medicare Other | Admitting: Family Medicine

## 2014-10-25 ENCOUNTER — Encounter: Payer: Self-pay | Admitting: Family Medicine

## 2014-10-25 ENCOUNTER — Ambulatory Visit (INDEPENDENT_AMBULATORY_CARE_PROVIDER_SITE_OTHER): Payer: Medicare Other | Admitting: Family Medicine

## 2014-10-25 VITALS — BP 114/78 | HR 92 | Resp 16 | Ht 67.0 in | Wt 132.1 lb

## 2014-10-25 DIAGNOSIS — Z1211 Encounter for screening for malignant neoplasm of colon: Secondary | ICD-10-CM

## 2014-10-25 DIAGNOSIS — Z0000 Encounter for general adult medical examination without abnormal findings: Secondary | ICD-10-CM

## 2014-10-25 DIAGNOSIS — M8580 Other specified disorders of bone density and structure, unspecified site: Secondary | ICD-10-CM

## 2014-10-25 DIAGNOSIS — E039 Hypothyroidism, unspecified: Secondary | ICD-10-CM | POA: Diagnosis not present

## 2014-10-25 DIAGNOSIS — Z23 Encounter for immunization: Secondary | ICD-10-CM

## 2014-10-25 DIAGNOSIS — E038 Other specified hypothyroidism: Secondary | ICD-10-CM

## 2014-10-25 DIAGNOSIS — Z8249 Family history of ischemic heart disease and other diseases of the circulatory system: Secondary | ICD-10-CM | POA: Diagnosis not present

## 2014-10-25 DIAGNOSIS — E559 Vitamin D deficiency, unspecified: Secondary | ICD-10-CM | POA: Diagnosis not present

## 2014-10-25 DIAGNOSIS — E785 Hyperlipidemia, unspecified: Secondary | ICD-10-CM

## 2014-10-25 NOTE — Patient Instructions (Addendum)
Annual physical exam in 6.5 month, call if you need me before  Please continue to commit to 30 mins aerobic activity 5 days per week  Please continue to reduce fried and fatty foods   Pls sched your mammogram  You are referred to Dr Laural Golden for your colonoscopy which is due   Please check on Prevnar/  New pneumomnia vaccine , you do need this

## 2014-10-25 NOTE — Progress Notes (Signed)
Subjective:    Patient ID: Amanda Harmon, female    DOB: Apr 16, 1946, 69 y.o.   MRN: [id number]  HPI Preventive Screening-Counseling & Management   Patient present here today for an initial  Medicare annual wellness visit.   Current Problems (verified)   Medications Prior to Visit Allergies (verified)   PAST HISTORY  Family History (verified)   Social History Married, 3 years, 3 children, retired from being a Pharmacist, hospital    Risk Factors  Current exercise habits: tries to walk 2 miles but hasn't done much since [place]. Does walk up and down stairs at home a lot   Dietary issues discussed: Heart healthy diet discussed. Limits her red meat and fried fatty foods and eats a lot of fruits and vegetables and is trying to cut back on her butter intake    Cardiac risk factors: Both parents suffered MI's , however both smoked , but father was age 41 Pt has hyperlipidemia, needs EKG   Depression Screen  (Note: if answer to either of the following is "Yes", a more complete depression screening is indicated)   Over the past two weeks, have you felt down, depressed or hopeless? No  Over the past two weeks, have you felt little interest or pleasure in doing things? No  Have you lost interest or pleasure in daily life? No  Do you often feel hopeless? No  Do you cry easily over simple problems? No   Activities of Daily Living  In your present state of health, do you have any difficulty performing the following activities?  Driving?: No Managing money?: No Feeding yourself?:No Getting from bed to chair?:No Climbing a flight of stairs?:No Preparing food and eating?:No Bathing or showering?:No Getting dressed?:No Getting to the toilet?:No Using the toilet?:No Moving around from place to place?: No  Fall Risk Assessment In the past year have you fallen or had a near fall?: one fall at the [facility], tripped over the speed bump while walking. No injury Are you currently taking any  medications that make you dizziness?:No   Hearing Difficulties: No Do you often ask people to speak up or repeat themselves?:No Do you experience ringing or noises in your ears?:No Do you have difficulty understanding soft or whispered voices?:No  Cognitive Testing  Alert? Yes Normal Appearance?Yes  Oriented to person? Yes Place? Yes  Time? Yes  Displays appropriate judgment?Yes  Can read the correct time from a watch face? yes Are you having problems remembering things? No   Advanced Directives have been discussed with the patient?Yes, no living will, brochure given , discussed furhter with atient who plans to work on this , has not done so up until now   List the Names of Other Physician/Practitioners you currently use:  Dr Dorris Fetch ([facility])   Indicate any recent Medical Services you may have received from other than [facility] providers in the past year (date may be approximate).   Assessment:    Annual Wellness Exam   Plan:    Medicare Attestation  I have personally reviewed:  The patient's medical and social history  Their use of alcohol, tobacco or illicit drugs  Their current medications and supplements  The patient's functional ability including ADLs,fall risks, home safety risks, cognitive, and hearing and visual impairment  Diet and physical activities  Evidence for depression or mood disorders  The patient's weight, height, BMI, and visual acuity have been recorded in the chart. I have made referrals, counseling, and provided education to the patient based on  review of the above and I have provided the patient with a written personalized care plan for preventive services.      Review of Systems CVS: Denies chest pains, exertional fatigue, palpitations, PND, orthopnea or leg swelling. No current tobacco use, remote history , positive premature CAD in father with pt's personal h/o dyslipidemia    Objective:   Physical Exam BP 114/78 mmHg  Pulse 92  Resp 16  Ht 5\' 7"   (1.702 m)  Wt 132 lb 1.9 oz (59.929 kg)  BMI 20.69 kg/m2  SpO2 96%        Assessment & Plan:  Medicare annual wellness visit, initial Annual exam as documented. Counseling done  re healthy lifestyle involving commitment to 150 minutes exercise per week, heart healthy diet, and attaining healthy weight.The importance of adequate sleep also discussed. Regular seat belt use and home safety, is also discussed.Also fall risk reduction Changes in health habits are decided on by the patient with goals and time frames  set for achieving them. Immunization and cancer screening needs are specifically addressed at this visit.    Need for vaccination with 13-polyvalent pneumococcal conjugate vaccine Vaccine administration linked to 10/25/2014 visit, pt returned on 10/30/2014 after she had ascertained she had not received the vaccine before   FH: CAD (coronary artery disease) Father died of an MI in his early 20's Pt recalled for EKG and had this done on 10/30/2014. Read as abnormal, with possible prior anterior infarct. Pt should have cardiology eval based on f/h, abnormal EKG and hyperlipidemia, will discuss with her further She is asymptomatic and returned a prior tracing over 10 years ago which is read as normal , and not too dissimilar in appearance, current is not remarlkably abnormal The benefit of cardiology evaluation is still and advantage in my opinion and I will speak directly with her about this ton 10/31/2014

## 2014-10-26 DIAGNOSIS — Z0000 Encounter for general adult medical examination without abnormal findings: Secondary | ICD-10-CM | POA: Insufficient documentation

## 2014-10-26 NOTE — Assessment & Plan Note (Signed)
Annual exam as documented. Counseling done  re healthy lifestyle involving commitment to 150 minutes exercise per week, heart healthy diet, and attaining healthy weight.The importance of adequate sleep also discussed. Regular seat belt use and home safety, is also discussed.Also fall risk reduction Changes in health habits are decided on by the patient with goals and time frames  set for achieving them. Immunization and cancer screening needs are specifically addressed at this visit.

## 2014-10-29 ENCOUNTER — Encounter (INDEPENDENT_AMBULATORY_CARE_PROVIDER_SITE_OTHER): Payer: Self-pay | Admitting: *Deleted

## 2014-10-30 ENCOUNTER — Ambulatory Visit: Payer: Medicare Other

## 2014-10-30 DIAGNOSIS — Z23 Encounter for immunization: Secondary | ICD-10-CM | POA: Diagnosis not present

## 2014-10-31 DIAGNOSIS — Z23 Encounter for immunization: Secondary | ICD-10-CM | POA: Insufficient documentation

## 2014-10-31 DIAGNOSIS — Z8249 Family history of ischemic heart disease and other diseases of the circulatory system: Secondary | ICD-10-CM | POA: Insufficient documentation

## 2014-10-31 NOTE — Assessment & Plan Note (Signed)
Vaccine administration linked to 10/25/2014 visit, pt returned on 10/30/2014 after she had ascertained she had not received the vaccine before

## 2014-10-31 NOTE — Assessment & Plan Note (Signed)
Father died of an MI in his early 88's Pt recalled for EKG and had this done on 10/30/2014. Read as abnormal, with possible prior anterior infarct. Pt should have cardiology eval based on f/h, abnormal EKG and hyperlipidemia, will discuss with her further She is asymptomatic and returned a prior tracing over 10 years ago which is read as normal , and not too dissimilar in appearance, current is not remarlkably abnormal The benefit of cardiology evaluation is still and advantage in my opinion and I will speak directly with her about this ton 10/31/2014

## 2014-11-01 ENCOUNTER — Other Ambulatory Visit: Payer: Self-pay | Admitting: Family Medicine

## 2014-11-01 DIAGNOSIS — R9431 Abnormal electrocardiogram [ECG] [EKG]: Secondary | ICD-10-CM

## 2014-11-01 DIAGNOSIS — Z8249 Family history of ischemic heart disease and other diseases of the circulatory system: Secondary | ICD-10-CM

## 2014-11-02 ENCOUNTER — Telehealth: Payer: Self-pay | Admitting: Cardiovascular Disease

## 2014-11-02 NOTE — Telephone Encounter (Signed)
Amanda Harmon in Dr. Griffin Dakin office is aware that Dr Angelena Form cal see pr on 11/05/14 at 9:30 or 9:45 AM. Pt is  scheduled for appointment at  9:45 AM. Dr. Griffin Dakin office will let pt know.

## 2014-11-02 NOTE — Telephone Encounter (Signed)
New Message         Calling from Dr. Griffin Dakin office. Stating that Dr. Moshe Cipro spoke to Dr. Angelena Form yesterday and he stated that he would see the pt next week. Please call back and advise.

## 2014-11-02 NOTE — Telephone Encounter (Signed)
Loanne from Dr [employer] office called to make an appointment for pt with Dr. Angelena Form for next week. An January 03 2015 was given wish was the earliest appointment available. Loanne states that Dr. Moshe Cipro spoke with DR. Mcalhany yesterday and he agreed to see pt next week. Eual Fines is aware that Dr Angelena Form needs to okay for [place] to over book him.

## 2014-11-02 NOTE — Telephone Encounter (Signed)
I can see her on 11/05/14 at 9:30 or 9:45 if she can make that. Gerald Stabs

## 2014-11-05 ENCOUNTER — Encounter: Payer: Self-pay | Admitting: Cardiovascular Disease

## 2014-11-05 ENCOUNTER — Ambulatory Visit (INDEPENDENT_AMBULATORY_CARE_PROVIDER_SITE_OTHER): Payer: Medicare Other | Admitting: Cardiovascular Disease

## 2014-11-05 VITALS — BP 122/64 | HR 61 | Ht 67.0 in | Wt 130.8 lb

## 2014-11-05 DIAGNOSIS — R9431 Abnormal electrocardiogram [ECG] [EKG]: Secondary | ICD-10-CM

## 2014-11-05 NOTE — Progress Notes (Signed)
History of Present Illness: 69 yo WF with history of borderline hyperlipidemia who is here today for follow up. I last saw her in 2012. She had atypical chest pain in 2011 during a stressful situation and then other episodes of chest pain and dyspnea at rest in 2012. Echo 02/17/11 with normal LV systolic function, grade 2 diastolic dysfunction. Exercise stress test 02/17/11 with good exercise tolerance and no ischemic EKG changes. She was recently seen in primary care and there were changes noted in her EKG.   She tells me that she has been feeling well. She has no chest pain, SOB, dizziness, palpitations. She has been very active and exercises several days per week.    Primary care: Dr. Moshe Cipro  Past Medical History  Diagnosis Date  . Hyperlipidemia     borderline   . 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 Vertigo   . Mitral valve prolapse 1984  . Cystitis   . Thyroid disease 2015    hypothyroidism    Past Surgical History  Procedure Laterality Date  . Tonsillectomy  1965  . Tubal ligation  1983  . Back surgery  2008  . Breast biopsy  1998    left - benign  . Eye surgery Left 12/01/2013    cataract  . Eye surgery Right 12/15/2013    cataract    Current Outpatient Prescriptions  Medication Sig Dispense Refill  . Biotin 10 MG TABS Take 1 tablet by mouth daily.    . Calcium Carbonate-Vit D-Min (CALCIUM 1200 PO) Take 1 tablet by mouth daily.    . ergocalciferol (VITAMIN D2) 50000 UNITS capsule Take 50,000 Units by mouth once a week.    . levothyroxine (SYNTHROID, LEVOTHROID) 50 MCG tablet Take 50 mcg by mouth daily before breakfast.    . Multiple Vitamin (MULTIVITAMIN) capsule Take 1 capsule by mouth daily.       No current facility-administered medications for this visit.    Allergies  Allergen Reactions  . Aspirin Other (See Comments)    Bruising    History   Social History  . Marital Status: Married    Spouse Name: N/A    Number of Children: 3  . Years of  Education: N/A   Occupational History  . Retired-preschool teacher    Social History Main Topics  . Smoking status: Former Research scientist (life sciences)  . Smokeless tobacco: Not on file  . Alcohol Use: Yes     Comment: socially   . Drug Use: No  . Sexual Activity: Yes   Other Topics Concern  . Not on file   Social History Narrative    Family History  Problem Relation Age of Onset  . Heart attack Mother 29    used tobacco   . Arthritis Mother     severe   . Heart attack Father 68    used tobacco   . Alcohol abuse Father   . Cancer Brother     prostate   . Osteoporosis Sister     severe     Review of Systems:  As stated in the HPI and otherwise negative.   BP 122/64 mmHg  Pulse 61  Ht 5\' 7"  (1.702 m)  Wt 130 lb 12.8 oz (59.33 kg)  BMI 20.48 kg/m2  SpO2 98%  Physical Examination: General: Well developed, well nourished, NAD HEENT: OP clear, mucus membranes moist SKIN: warm, dry. No rashes. Neuro: No focal deficits Musculoskeletal: Muscle strength 5/5 all ext Psychiatric: Mood and affect  normal Neck: No JVD, no carotid bruits, no thyromegaly, no lymphadenopathy. Lungs:Clear bilaterally, no wheezes, rhonci, crackles Cardiovascular: Regular rate and rhythm. No murmurs, gallops or rubs. Abdomen:Soft. Bowel sounds present. Non-tender.  Extremities: No lower extremity edema. Pulses are 2 + in the bilateral DP/PT.  Echo 02/17/11: Left ventricle: The cavity size was normal. Wall thickness was normal. Systolic function was normal. The estimated ejection fraction was in the range of 55% to 60%. Wall motion was normal; there were no regional wall motion abnormalities. Features are consistent with a pseudonormal left ventricular filling pattern, with concomitant abnormal relaxation and increased filling pressure (grade 2 diastolic dysfunction). - Atrial septum: A patent foramen ovale cannot be excluded.  EKG: NSR, rate 61 bpm. Poor R wave progression precordial leads.  Unchanged from 2012.   Assessment and Plan:   1. Abnormal EKG: Her EKG shows poor R wave progression in the precordial leads, overall unchanged from 2012. She had a normal stress test and echo in 2012. She has no symptoms concerning for angina, CHF or arrhythmias. No further workup at this time.

## 2014-11-05 NOTE — Patient Instructions (Signed)
Your physician wants you to follow-up in:  2 years. You will receive a reminder letter in the mail two months in advance. If you don't receive a letter, please call our office to schedule the follow-up appointment.   

## 2014-11-16 ENCOUNTER — Other Ambulatory Visit (INDEPENDENT_AMBULATORY_CARE_PROVIDER_SITE_OTHER): Payer: Self-pay | Admitting: *Deleted

## 2014-11-16 DIAGNOSIS — Z1211 Encounter for screening for malignant neoplasm of colon: Secondary | ICD-10-CM

## 2014-11-26 ENCOUNTER — Telehealth (INDEPENDENT_AMBULATORY_CARE_PROVIDER_SITE_OTHER): Payer: Self-pay | Admitting: *Deleted

## 2014-11-26 DIAGNOSIS — Z1211 Encounter for screening for malignant neoplasm of colon: Secondary | ICD-10-CM

## 2014-11-26 NOTE — Telephone Encounter (Signed)
Patient needs trilyte 

## 2014-11-27 MED ORDER — PEG 3350-KCL-NA BICARB-NACL 420 G PO SOLR: 4000.0000 mL | Freq: Once | ORAL | Status: DC

## 2014-12-18 ENCOUNTER — Other Ambulatory Visit: Payer: Self-pay | Admitting: Family Medicine

## 2014-12-18 DIAGNOSIS — Z1231 Encounter for screening mammogram for malignant neoplasm of breast: Secondary | ICD-10-CM

## 2014-12-19 ENCOUNTER — Telehealth (INDEPENDENT_AMBULATORY_CARE_PROVIDER_SITE_OTHER): Payer: Self-pay | Admitting: *Deleted

## 2014-12-19 NOTE — Telephone Encounter (Signed)
Referring MD/PCP: simpson   Procedure: tcs  Reason/Indication:  screening  Has patient had this procedure before?  Yes, 2006 -- epic  If so, when, by whom and where?    Is there a family history of colon cancer?  no  Who?  What age when diagnosed?    Is patient diabetic?   no      Does patient have prosthetic heart valve?  no  Do you have a pacemaker?  no  Has patient ever had endocarditis? no  Has patient had joint replacement within last 12 months?  no  Does patient tend to be constipated or take laxatives? no  Is patient on Coumadin, Plavix and/or Aspirin? no  Medications: see epic  Allergies: nkda  Medication Adjustment:   Procedure date & time: 01/17/15 at 830

## 2014-12-21 ENCOUNTER — Ambulatory Visit (HOSPITAL_COMMUNITY)
Admission: RE | Admit: 2014-12-21 | Discharge: 2014-12-21 | Disposition: A | Discharge status: Home / Self Care | Payer: Medicare Other | Source: Ambulatory Visit | Attending: Family Medicine | Admitting: Family Medicine

## 2014-12-21 DIAGNOSIS — Z1231 Encounter for screening mammogram for malignant neoplasm of breast: Secondary | ICD-10-CM

## 2014-12-24 NOTE — Telephone Encounter (Signed)
agree

## 2015-01-03 ENCOUNTER — Encounter: Payer: Medicare Other | Admitting: Cardiovascular Disease

## 2015-01-17 ENCOUNTER — Ambulatory Visit (HOSPITAL_COMMUNITY)
Admission: RE | Admit: 2015-01-17 | Discharge: 2015-01-17 | Disposition: A | Discharge status: Home / Self Care | Payer: Medicare Other | Source: Ambulatory Visit | Attending: Internal Medicine | Admitting: Internal Medicine

## 2015-01-17 ENCOUNTER — Encounter (HOSPITAL_COMMUNITY): Payer: Self-pay | Admitting: *Deleted

## 2015-01-17 ENCOUNTER — Encounter (HOSPITAL_COMMUNITY)
Admission: RE | Disposition: A | Discharge status: Home / Self Care | Payer: Self-pay | Source: Ambulatory Visit | Attending: Internal Medicine

## 2015-01-17 DIAGNOSIS — Z1211 Encounter for screening for malignant neoplasm of colon: Secondary | ICD-10-CM | POA: Diagnosis not present

## 2015-01-17 DIAGNOSIS — Z87891 Personal history of nicotine dependence: Secondary | ICD-10-CM | POA: Diagnosis not present

## 2015-01-17 DIAGNOSIS — Z888 Allergy status to other drugs, medicaments and biological substances status: Secondary | ICD-10-CM | POA: Diagnosis not present

## 2015-01-17 DIAGNOSIS — E785 Hyperlipidemia, unspecified: Secondary | ICD-10-CM | POA: Insufficient documentation

## 2015-01-17 DIAGNOSIS — Z9851 Tubal ligation status: Secondary | ICD-10-CM | POA: Diagnosis not present

## 2015-01-17 DIAGNOSIS — I341 Nonrheumatic mitral (valve) prolapse: Secondary | ICD-10-CM | POA: Insufficient documentation

## 2015-01-17 DIAGNOSIS — E039 Hypothyroidism, unspecified: Secondary | ICD-10-CM | POA: Insufficient documentation

## 2015-01-17 HISTORY — PX: COLONOSCOPY: SHX5424

## 2015-01-17 SURGERY — COLONOSCOPY
Anesthesia: Moderate Sedation

## 2015-01-17 MED ORDER — SODIUM CHLORIDE 0.9 % IV SOLN
INTRAVENOUS | Status: DC
  Administered 2015-01-17: 1000 mL via INTRAVENOUS

## 2015-01-17 MED ORDER — MIDAZOLAM HCL 5 MG/5ML IJ SOLN
INTRAMUSCULAR | Status: AC
  Filled 2015-01-17: qty 10

## 2015-01-17 MED ORDER — MEPERIDINE HCL 50 MG/ML IJ SOLN
INTRAMUSCULAR | Status: DC | PRN
  Administered 2015-01-17 (×2): 25 mg via INTRAVENOUS

## 2015-01-17 MED ORDER — MIDAZOLAM HCL 5 MG/5ML IJ SOLN
INTRAMUSCULAR | Status: DC | PRN
  Administered 2015-01-17: 2 mg via INTRAVENOUS

## 2015-01-17 MED ORDER — MEPERIDINE HCL 50 MG/ML IJ SOLN
INTRAMUSCULAR | Status: AC
  Filled 2015-01-17: qty 1

## 2015-01-17 MED ORDER — STERILE WATER FOR IRRIGATION IR SOLN
Status: DC | PRN
  Administered 2015-01-17: 09:00:00

## 2015-01-17 NOTE — Discharge Instructions (Signed)
Resume usual medications and diet. No driving for 24 hours. Next screening exam in 10 years.  Colonoscopy, Care After These instructions give you information on caring for yourself after your procedure. Your doctor may also give you more specific instructions. Call your doctor if you have any problems or questions after your procedure. HOME CARE  Do not drive for 24 hours.  Do not sign important papers or use machinery for 24 hours.  You may shower.  You may go back to your usual activities, but go slower for the first 24 hours.  Take rest breaks often during the first 24 hours.  Walk around or use warm packs on your belly (abdomen) if you have belly cramping or gas.  Drink enough fluids to keep your pee (urine) clear or pale yellow.  Resume your normal diet. Avoid heavy or fried foods.  Avoid drinking alcohol for 24 hours or as told by your doctor.  Only take medicines as told by your doctor. If a tissue sample (biopsy) was taken during the procedure:   Do not take aspirin or blood thinners for 7 days, or as told by your doctor.  Do not drink alcohol for 7 days, or as told by your doctor.  Eat soft foods for the first 24 hours. GET HELP IF: You still have a small amount of blood in your poop (stool) 2-3 days after the procedure. GET HELP RIGHT AWAY IF:  You have more than a small amount of blood in your poop.  You see clumps of tissue (blood clots) in your poop.  Your belly is puffy (swollen).  You feel sick to your stomach (nauseous) or throw up (vomit).  You have a fever.  You have belly pain that gets worse and medicine does not help. MAKE SURE YOU:  Understand these instructions.  Will watch your condition.  Will get help right away if you are not doing well or get worse. Document Released: 10/17/2010 Document Revised: 09/19/2013 Document Reviewed: 05/22/2013 [facility] Patient Information 2015 [place], [place]. This information is not intended to replace  advice given to you by your health care provider. Make sure you discuss any questions you have with your health care provider.

## 2015-01-17 NOTE — Op Note (Signed)
COLONOSCOPY PROCEDURE REPORT  PATIENT:  Amanda Harmon  MR#:  [id number] Birthdate:  06/10/1946, 69 y.o., female Endoscopist:  Dr. Rogene Houston, MD Referred By:  Dr. Tula Nakayama, MD  Procedure Date: 01/17/2015  Procedure:   Colonoscopy  Indications:  Patient is 69 year old Caucasian female was undergoing average risk screening colonoscopy.  Informed Consent:  The procedure and risks were reviewed with the patient and informed consent was obtained.  Medications:  Demerol 50 mg IV Versed 2 mg IV  Description of procedure:  After a digital rectal exam was performed, that colonoscope was advanced from the anus through the rectum and colon to the area of the cecum, ileocecal valve and appendiceal orifice. The cecum was deeply intubated. These structures were well-seen and photographed for the record. From the level of the cecum and ileocecal valve, the scope was slowly and cautiously withdrawn. The mucosal surfaces were carefully surveyed utilizing scope tip to flexion to facilitate fold flattening as needed. The scope was pulled down into the rectum where a thorough exam including retroflexion was performed.  Findings:   Prep satisfactory. She had stool coating mucosa of cecum and ascending colon but landmarks well seen. Normal mucosa of cecum, ascending colon, hepatic flexure, transverse colon, splenic flexure, descending and sigmoid colon. Normal rectal mucosa. Focal erythema noted to mucosa just above the dentate line. This area was palpated with forceps and was soft and felt to be irritation secondary to prep.   Therapeutic/Diagnostic Maneuvers Performed:   None  Complications:  None  Cecal Withdrawal Time:  13 minutes  Impression:  Examination performed to cecum. Normal colonoscopy.  Recommendations:  Standard instructions given. Next screening exam in 10 years.  REHMAN,NAJEEB U  01/17/2015 9:17 AM  CC: Dr. Tula Nakayama, MD & Dr. Rayne Du ref. provider  found

## 2015-01-17 NOTE — H&P (Signed)
Amanda Harmon is an 69 y.o. female.   Chief Complaint: Patient is here for colonoscopy. HPI: Patient is 69 year old Caucasian female who is here for screening colonoscopy. She denies abdominal pain change in bowel habits or rectal bleeding. Last colonoscopy was 10 years ago. Family history is negative for CRC.  Past Medical History  Diagnosis Date  . Hyperlipidemia     borderline   . 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 Vertigo   . Mitral valve prolapse 1984  . Cystitis   . Thyroid disease 2015    hypothyroidism    Past Surgical History  Procedure Laterality Date  . Tonsillectomy  1965  . Tubal ligation  1983  . Back surgery  2008  . Breast biopsy  1998    left - benign  . Eye surgery Left 12/01/2013    cataract  . Eye surgery Right 12/15/2013    cataract    Family History  Problem Relation Age of Onset  . Heart attack Mother 73    used tobacco   . Arthritis Mother     severe   . Heart attack Father 64    used tobacco   . Alcohol abuse Father   . Cancer Brother     prostate   . Osteoporosis Sister     severe    Social History:  reports that she has quit smoking. She does not have any smokeless tobacco history on file. She reports that she drinks alcohol. She reports that she does not use illicit drugs.  Allergies:  Allergies  Allergen Reactions  . Aspirin Other (See Comments)    Bruising    Medications Prior to Admission  Medication Sig Dispense Refill  . Calcium Carbonate-Vit D-Min (CALCIUM 1200 PO) Take 1 tablet by mouth daily.    . ergocalciferol (VITAMIN D2) 50000 UNITS capsule Take 50,000 Units by mouth once a week.    . levothyroxine (SYNTHROID, LEVOTHROID) 50 MCG tablet Take 50 mcg by mouth daily before breakfast.    . Multiple Vitamin (MULTIVITAMIN) capsule Take 1 capsule by mouth daily.      . polyethylene glycol-electrolytes (NULYTELY/GOLYTELY) 420 G solution Take 4,000 mLs by mouth once. 4000 mL 0    No results found for this or any  previous visit (from the past 48 hour(s)). No results found.  ROS  Blood pressure 116/64, pulse 66, temperature 97.5 F (36.4 C), temperature source Oral, resp. rate 19, height 5\' 7"  (1.702 m), weight 130 lb (58.968 kg), SpO2 98 %. Physical Exam  Constitutional: She appears well-developed and well-nourished.  HENT:  Mouth/Throat: Oropharynx is clear and moist.  Eyes: Conjunctivae are normal. No scleral icterus.  Neck: No thyromegaly present.  Cardiovascular: Normal rate, regular rhythm and normal heart sounds.   No murmur heard. Respiratory: Effort normal and breath sounds normal.  GI: Soft. She exhibits no distension and no mass. There is no tenderness.  Musculoskeletal: She exhibits no edema.  Lymphadenopathy:    She has no cervical adenopathy.  Neurological: She is alert.  Skin: Skin is warm and dry.     Assessment/Plan Average risk screening colonoscopy.  REHMAN,NAJEEB U 01/17/2015, 8:34 AM

## 2015-01-18 ENCOUNTER — Encounter (HOSPITAL_COMMUNITY): Payer: Self-pay | Admitting: Internal Medicine

## 2015-02-11 DIAGNOSIS — M79671 Pain in right foot: Secondary | ICD-10-CM | POA: Diagnosis not present

## 2015-02-11 DIAGNOSIS — M216X1 Other acquired deformities of right foot: Secondary | ICD-10-CM | POA: Diagnosis not present

## 2015-02-11 DIAGNOSIS — L851 Acquired keratosis [keratoderma] palmaris et plantaris: Secondary | ICD-10-CM | POA: Diagnosis not present

## 2015-02-11 DIAGNOSIS — Q828 Other specified congenital malformations of skin: Secondary | ICD-10-CM | POA: Diagnosis not present

## 2015-02-20 ENCOUNTER — Emergency Department (HOSPITAL_COMMUNITY): Payer: Medicare Other

## 2015-02-20 ENCOUNTER — Emergency Department (HOSPITAL_COMMUNITY)
Admission: EM | Admit: 2015-02-20 | Discharge: 2015-02-21 | Payer: Medicare Other | Attending: Emergency Medicine | Admitting: Emergency Medicine

## 2015-02-20 ENCOUNTER — Encounter (HOSPITAL_COMMUNITY): Payer: Self-pay | Admitting: Emergency Medicine

## 2015-02-20 DIAGNOSIS — S0261XA Fracture of condylar process of mandible, initial encounter for closed fracture: Secondary | ICD-10-CM | POA: Diagnosis not present

## 2015-02-20 DIAGNOSIS — S02600A Fracture of unspecified part of body of mandible, initial encounter for closed fracture: Secondary | ICD-10-CM | POA: Diagnosis not present

## 2015-02-20 DIAGNOSIS — S0262XA Fracture of subcondylar process of mandible, initial encounter for closed fracture: Secondary | ICD-10-CM | POA: Insufficient documentation

## 2015-02-20 DIAGNOSIS — S299XXA Unspecified injury of thorax, initial encounter: Secondary | ICD-10-CM | POA: Diagnosis not present

## 2015-02-20 DIAGNOSIS — Z01818 Encounter for other preprocedural examination: Secondary | ICD-10-CM | POA: Diagnosis not present

## 2015-02-20 DIAGNOSIS — W01198A Fall on same level from slipping, tripping and stumbling with subsequent striking against other object, initial encounter: Secondary | ICD-10-CM | POA: Insufficient documentation

## 2015-02-20 DIAGNOSIS — Y999 Unspecified external cause status: Secondary | ICD-10-CM | POA: Diagnosis not present

## 2015-02-20 DIAGNOSIS — Y929 Unspecified place or not applicable: Secondary | ICD-10-CM | POA: Diagnosis not present

## 2015-02-20 DIAGNOSIS — S02609A Fracture of mandible, unspecified, initial encounter for closed fracture: Secondary | ICD-10-CM

## 2015-02-20 DIAGNOSIS — Y9389 Activity, other specified: Secondary | ICD-10-CM | POA: Insufficient documentation

## 2015-02-20 DIAGNOSIS — S0181XA Laceration without foreign body of other part of head, initial encounter: Secondary | ICD-10-CM | POA: Diagnosis not present

## 2015-02-20 DIAGNOSIS — W19XXXA Unspecified fall, initial encounter: Secondary | ICD-10-CM

## 2015-02-20 DIAGNOSIS — T07 Unspecified multiple injuries: Secondary | ICD-10-CM | POA: Diagnosis present

## 2015-02-20 MED ORDER — LIDOCAINE-EPINEPHRINE-TETRACAINE (LET) SOLUTION
3.0000 mL | Freq: Once | NASAL | Status: AC
  Administered 2015-02-20: 3 mL via TOPICAL
  Filled 2015-02-20: qty 3

## 2015-02-20 MED ORDER — LIDOCAINE HCL (PF) 1 % IJ SOLN
5.0000 mL | Freq: Once | INTRAMUSCULAR | Status: DC
  Filled 2015-02-20 (×2): qty 5

## 2015-02-20 MED ORDER — SODIUM CHLORIDE 0.9 % IV BOLUS (SEPSIS): 500.0000 mL | Freq: Once | INTRAVENOUS | Status: DC

## 2015-02-20 NOTE — ED Provider Notes (Signed)
CSN: [id number]     Arrival date & time 02/20/15  2029 History   First MD Initiated Contact with Patient 02/20/15 2044     Chief Complaint  Patient presents with  . Fall     (Consider location/radiation/quality/duration/timing/severity/associated sxs/prior Treatment) HPI   Amanda Harmon is a 69 y.o. female transferred from [facility] for evaluation of mandibular fracture. Patient states that she was walking on an uneven sidewalk and had a slip and fall. There is a laceration underneath her chin that was sutured at outside hospital. Patient also notes a broken tooth to the right upper jaw that is causing her pain. She states that she took 2 Motrin before coming to the ED and now pain is minimal. She denies any intraoral bleeding, loss of consciousness, cervicalgia, loss of consciousness, change of vision, nausea, vomiting, chest pain, shortness of breath. No difficulty ambulating and no pain with range of motion on arms. Last oral intake was at dinner tonight around 7 PM.  Past Medical History  Diagnosis Date  . Hyperlipidemia     borderline   . 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 Vertigo   . Mitral valve prolapse 1984  . Cystitis   . Thyroid disease 2015    hypothyroidism   Past Surgical History  Procedure Laterality Date  . Tonsillectomy  1965  . Tubal ligation  1983  . Back surgery  2008  . Breast biopsy  1998    left - benign  . Eye surgery Left 12/01/2013    cataract  . Eye surgery Right 12/15/2013    cataract  . Colonoscopy N/A 01/17/2015    Procedure: COLONOSCOPY;  Surgeon: Rogene Houston, MD;  [facility];  Service: Endoscopy;  Laterality: N/A;  830 - moved to 4/21 @ 8:30   Family History  Problem Relation Age of Onset  . Heart attack Mother 17    used tobacco   . Arthritis Mother     severe   . Heart attack Father 50    used tobacco   . Alcohol abuse Father   . Cancer Brother     prostate   . Osteoporosis Sister     severe     History  Substance Use Topics  . Smoking status: Former Research scientist (life sciences)  . Smokeless tobacco: Not on file  . Alcohol Use: Yes     Comment: socially    OB History    No data available     Review of Systems  10 systems reviewed and found to be negative, except as noted in the HPI.   Allergies  Aspirin  Home Medications   Prior to Admission medications   Medication Sig Start Date End Date Taking? Authorizing Provider  Calcium Carbonate-Vit D-Min (CALCIUM 1200 PO) Take 1 tablet by mouth daily.   Yes Historical Provider, MD  ergocalciferol (VITAMIN D2) 50000 UNITS capsule Take 50,000 Units by mouth once a week.   Yes Historical Provider, MD  ketotifen (ZADITOR) 0.025 % ophthalmic solution Place 1 drop into both eyes every morning.   Yes Historical Provider, MD  levothyroxine (SYNTHROID, LEVOTHROID) 50 MCG tablet Take 50 mcg by mouth daily before breakfast.   Yes Historical Provider, MD  Multiple Vitamin (MULTIVITAMIN) capsule Take 1 capsule by mouth daily.     Yes Historical Provider, MD  cephALEXin (KEFLEX) 500 MG capsule Take 1 capsule (500 mg total) by mouth 4 (four) times daily. 02/21/15   Elmyra Ricks Jakirah Zaun, PA-C  HYDROcodone-acetaminophen (  NORCO/VICODIN) 5-325 MG per tablet Take 1-2 tablets by mouth every 6 hours as needed for pain and/or cough. 02/21/15   Kelton Bultman, PA-C   BP 148/66 mmHg  Pulse 66  Temp(Src) 98.2 F (36.8 C) (Oral)  Resp 18  Ht 5\' 7"  (1.702 m)  Wt 130 lb (58.968 kg)  BMI 20.36 kg/m2  SpO2 99% Physical Exam  Constitutional: She is oriented to person, place, and time. She appears well-developed and well-nourished. No distress.  HENT:  Head: Normocephalic.  Mouth/Throat: Oropharynx is clear and moist.  Sutured laceration underneath chin.  No gross intraoral trauma, no loose teeth.  Good range of motion to TMJ, face is not swollen. No bruising.  Eyes: Conjunctivae and EOM are normal. Pupils are equal, round, and reactive to light.  Neck: Normal range of  motion. Neck supple.  Cardiovascular: Normal rate, regular rhythm and intact distal pulses.   Pulmonary/Chest: Effort normal. No stridor. No respiratory distress. She has no wheezes. She has no rales. She exhibits no tenderness.  Musculoskeletal: Normal range of motion.  Neurological: She is alert and oriented to person, place, and time.  Psychiatric: She has a normal mood and affect.  Nursing note and vitals reviewed.   ED Course  Procedures (including critical care time) Labs Review Labs Reviewed  BASIC METABOLIC PANEL - Abnormal; Notable for the following:    Glucose, Bld 106 (*)    All other components within normal limits  CBC WITH DIFFERENTIAL/PLATELET    Imaging Review Dg Chest 2 View  02/21/2015   CLINICAL DATA:  Post fall on sidewalk.  Preop examination.  EXAM: CHEST  2 VIEW  COMPARISON:  07/29/2007; chest CT - 11/25/2007  FINDINGS: Grossly unchanged cardiac silhouette and mediastinal contours. The lungs remain hyperexpanded with nodular thickening of the bilateral pulmonary parenchyma. No focal airspace opacities. No pleural effusion or pneumothorax. No evidence of edema. No acute osseus abnormalities.  IMPRESSION: Hyperexpanded lungs and bronchitic change without acute cardiopulmonary disease.   Electronically Signed   By: Sandi Mariscal M.D.   On: 02/21/2015 01:02   Ct Maxillofacial Wo Cm  02/20/2015   CLINICAL DATA:  Fall with chin laceration and tooth fracture. Initial encounter.  EXAM: CT MAXILLOFACIAL WITHOUT CONTRAST  TECHNIQUE: Multidetector CT imaging of the maxillofacial structures was performed. Multiplanar CT image reconstructions were also generated. A small metallic BB was placed on the right temple in order to reliably differentiate right from left.  COMPARISON:  None.  FINDINGS: There is a deep laceration below the chin, extending to bone. Right mandible subcondylar fracture with 2 mm of medial displacement of the condyle. There is a nondisplaced condylar and  subcondylar fracture of the left mandible. Nondisplaced fracture through the left mandibular body, best visualized coronally and axially. No condyle dislocation.  No additional fracture, sinus effusion, evidence of globe injury, or postseptal hematoma. The patient is status post bilateral cataract resection. Limited intracranial imaging is negative.  IMPRESSION: 1. Mildly displaced right mandible subcondylar fracture 2. Nondisplaced left mandible condylar and subcondylar fracture. 3. Nondisplaced left mandibular body fracture.   Electronically Signed   By: Monte Fantasia M.D.   On: 02/20/2015 21:19     EKG Interpretation None      MDM   Final diagnoses:  Fall  Laceration of chin, initial encounter  Mandible fracture, closed, initial encounter  Preop examination    Filed Vitals:   02/20/15 2033 02/20/15 2238 02/21/15 0104  BP: 137/70 154/69 148/66  Pulse: 71 66 66  Temp:  98.3 F (36.8 C)  98.2 F (36.8 C)  TempSrc: Oral  Oral  Resp: 20 16 18   Height: 5\' 7"  (1.702 m)    Weight: 130 lb (58.968 kg)    SpO2: 100% 99% 99%    Medications  lidocaine (PF) (XYLOCAINE) 1 % injection 5 mL (5 mLs Intradermal Not Given 02/20/15 2256)  sodium chloride 0.9 % bolus 500 mL (not administered)  cephALEXin (KEFLEX) capsule 500 mg (not administered)  lidocaine-EPINEPHrine-tetracaine (LET) solution (3 mLs Topical Given 02/20/15 2118)    Amanda Harmon is a pleasant 69 y.o. female presenting with facial trauma status post fall. Patient has sutured laceration of the chin. She has a nondisplaced left mandibular body fracture in addition to a left mandibular condylar and subcondylar fracture and a right subcondylar fracture. Nose and throat consult from Dr. Janace Hoard appreciated: States patient will need to go to the OR for definitive management. Patient advised to remain nothing by mouth. Preop labs ordered.  Dr. Janace Hoard has evaluated the patient and deems her appropriate for discharge and outpatient follow  up.   Evaluation does not show pathology that would require ongoing emergent intervention or inpatient treatment. Pt is hemodynamically stable and mentating appropriately. Discussed findings and plan with patient/guardian, who agrees with care plan. All questions answered. Return precautions discussed and outpatient follow up given.   Discharge Medication List as of 02/21/2015 12:35 AM    START taking these medications   Details  cephALEXin (KEFLEX) 500 MG capsule Take 1 capsule (500 mg total) by mouth 4 (four) times daily., Starting 02/21/2015, Until Discontinued, Print    HYDROcodone-acetaminophen (NORCO/VICODIN) 5-325 MG per tablet Take 1-2 tablets by mouth every 6 hours as needed for pain and/or cough., Print           [place], PA-C 02/21/15 1224  Ernestina Patches, MD 02/21/15 [phone]

## 2015-02-20 NOTE — ED Notes (Signed)
Pt. Verbalized understanding of need to remain NPO at this time.

## 2015-02-20 NOTE — ED Notes (Signed)
Patient states she went for a walk and tripped on the sidewalk.  Patient has laceration to chin and states she can't open her jaw but a little bit and she broke a tooth during the fall.

## 2015-02-20 NOTE — ED Notes (Addendum)
Pt. Reports tripping on side walk. Pt. C/o right jaw pain and reports broken tooth. Pt. Denies LOC. Pt. With approximately 2cm laceration to chin. Bleeding controlled at this time. Pt. Denies any other injury.

## 2015-02-20 NOTE — ED Provider Notes (Signed)
CSN: [id number]     Arrival date & time 02/20/15  2029 History   First MD Initiated Contact with Patient 02/20/15 2044     Chief Complaint  Patient presents with  . Fall     (Consider location/radiation/quality/duration/timing/severity/associated sxs/prior Treatment) HPI   Amanda Harmon is a 69 y.o. female who presents to the Emergency Department complaining of right jaw pain and laceration to her chin.  Injury occurred just prior to arrival.  Patient states that she was walking home, tripped on the sidewalk and fell forward striking her chin on the pavement.  She has pain to her right TMJ area when opening and closing her mouth and also complains of a broken upper right tooth.  She took ibuprofen prior to ED arrival.  Td is UTD.  She denies head injury, LOC, neck pain, visual changes, dizziness, malocclusion and nausea.      Past Medical History  Diagnosis Date  . Hyperlipidemia     borderline   . 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 Vertigo   . Mitral valve prolapse 1984  . Cystitis   . Thyroid disease 2015    hypothyroidism   Past Surgical History  Procedure Laterality Date  . Tonsillectomy  1965  . Tubal ligation  1983  . Back surgery  2008  . Breast biopsy  1998    left - benign  . Eye surgery Left 12/01/2013    cataract  . Eye surgery Right 12/15/2013    cataract  . Colonoscopy N/A 01/17/2015    Procedure: COLONOSCOPY;  Surgeon: Rogene Houston, MD;  [facility];  Service: Endoscopy;  Laterality: N/A;  830 - moved to 4/21 @ 8:30   Family History  Problem Relation Age of Onset  . Heart attack Mother 42    used tobacco   . Arthritis Mother     severe   . Heart attack Father 62    used tobacco   . Alcohol abuse Father   . Cancer Brother     prostate   . Osteoporosis Sister     severe    History  Substance Use Topics  . Smoking status: Former Research scientist (life sciences)  . Smokeless tobacco: Not on file  . Alcohol Use: Yes     Comment: socially    OB  History    No data available     Review of Systems  Constitutional: Negative for fever and chills.  HENT: Positive for dental problem. Negative for facial swelling and trouble swallowing.   Eyes: Negative for visual disturbance.  Gastrointestinal: Negative for nausea, vomiting and abdominal pain.  Genitourinary: Negative for flank pain.  Musculoskeletal: Negative for back pain, joint swelling, arthralgias, neck pain and neck stiffness.  Skin: Positive for wound.       Laceration chin  Neurological: Negative for dizziness, syncope, speech difficulty, weakness, numbness and headaches.  Hematological: Does not bruise/bleed easily.  Psychiatric/Behavioral: Negative for confusion.  All other systems reviewed and are negative.     Allergies  Aspirin  Home Medications   Prior to Admission medications   Medication Sig Start Date End Date Taking? Authorizing Provider  Calcium Carbonate-Vit D-Min (CALCIUM 1200 PO) Take 1 tablet by mouth daily.    Historical Provider, MD  ergocalciferol (VITAMIN D2) 50000 UNITS capsule Take 50,000 Units by mouth once a week.    Historical Provider, MD  levothyroxine (SYNTHROID, LEVOTHROID) 50 MCG tablet Take 50 mcg by mouth daily before breakfast.  Historical Provider, MD  Multiple Vitamin (MULTIVITAMIN) capsule Take 1 capsule by mouth daily.      Historical Provider, MD   BP 137/70 mmHg  Pulse 71  Temp(Src) 98.3 F (36.8 C) (Oral)  Resp 20  Ht 5\' 7"  (1.702 m)  Wt 130 lb (58.968 kg)  BMI 20.36 kg/m2  SpO2 100%   Physical Exam  Constitutional: She is oriented to person, place, and time. She appears well-developed and well-nourished. No distress.  HENT:  Right Ear: Tympanic membrane and ear canal normal. No hemotympanum.  Left Ear: Tympanic membrane and ear canal normal. No hemotympanum.  Nose: No sinus tenderness. No epistaxis.  Mouth/Throat: Uvula is midline, oropharynx is clear and moist and mucous membranes are normal.    2 cm laceration  chin.  Bleeding controlled.  ttp of the right TMJ.  No bony deformity or click on exam.  Tenderness of the right upper second premolar and first molar.  No obvious malocclusion.  Left TMJ is non-tender.    Eyes: Conjunctivae and EOM are normal. Pupils are equal, round, and reactive to light.  Neck: Normal range of motion, full passive range of motion without pain and phonation normal. Neck supple.  Cardiovascular: Normal rate, regular rhythm, normal heart sounds and intact distal pulses.   No murmur heard. Pulmonary/Chest: Effort normal and breath sounds normal. No respiratory distress. She exhibits no tenderness.  Musculoskeletal: Normal range of motion.  Lymphadenopathy:    She has no cervical adenopathy.  Neurological: She is alert and oriented to person, place, and time. Coordination normal.  Skin: Skin is warm and dry.  Psychiatric: She has a normal mood and affect.  Nursing note and vitals reviewed.   ED Course  Procedures (including critical care time) Labs Review Labs Reviewed - No data to display  Imaging Review Ct Maxillofacial Wo Cm  02/20/2015   CLINICAL DATA:  Fall with chin laceration and tooth fracture. Initial encounter.  EXAM: CT MAXILLOFACIAL WITHOUT CONTRAST  TECHNIQUE: Multidetector CT imaging of the maxillofacial structures was performed. Multiplanar CT image reconstructions were also generated. A small metallic BB was placed on the right temple in order to reliably differentiate right from left.  COMPARISON:  None.  FINDINGS: There is a deep laceration below the chin, extending to bone. Right mandible subcondylar fracture with 2 mm of medial displacement of the condyle. There is a nondisplaced condylar and subcondylar fracture of the left mandible. Nondisplaced fracture through the left mandibular body, best visualized coronally and axially. No condyle dislocation.  No additional fracture, sinus effusion, evidence of globe injury, or postseptal hematoma. The patient is  status post bilateral cataract resection. Limited intracranial imaging is negative.  IMPRESSION: 1. Mildly displaced right mandible subcondylar fracture 2. Nondisplaced left mandible condylar and subcondylar fracture. 3. Nondisplaced left mandibular body fracture.   Electronically Signed   By: Monte Fantasia M.D.   On: 02/20/2015 21:19     EKG Interpretation None       LACERATION REPAIR Performed by: Stevin Bielinski L. Authorized by: Hale Bogus Consent: Verbal consent obtained. Risks and benefits: risks, benefits and alternatives were discussed Consent given by: patient Patient identity confirmed: provided demographic data Prepped and Draped in normal sterile fashion Wound explored  Laceration Location: chin Laceration Length: 2 cm  No Foreign Bodies seen or palpated  Anesthesia: topical application  Local anesthetic:LET Anesthetic total: 3  ml  Irrigation method: syringe Amount of cleaning: standard  Skin closure: 6-0 ethilon Number of sutures: 5  Technique: simple interrupted  Patient tolerance: Patient tolerated the procedure well with no immediate complications.   MDM   Final diagnoses:  Fall  Laceration of chin, initial encounter  Mandible fracture, closed, initial encounter   Pt also seen by Dr. Lacinda Axon, care plan discussed.  Pt informed of CT findings and I will consult ENT.    2155  Consulted Dr. Janace Hoard.  Recommends to have pt transferred to [facility] ED for further care.  Pt requests to go by private vehicle.    Consulted Dr. Aline Brochure, [facility] ED Pod B physician, notified of patient's POV transfer.   Kem Parkinson, PA-C 02/20/15 2329  Nat Christen, MD 02/20/15 [phone]

## 2015-02-21 ENCOUNTER — Emergency Department (HOSPITAL_COMMUNITY): Payer: Medicare Other

## 2015-02-21 DIAGNOSIS — Z01818 Encounter for other preprocedural examination: Secondary | ICD-10-CM | POA: Diagnosis not present

## 2015-02-21 DIAGNOSIS — S299XXA Unspecified injury of thorax, initial encounter: Secondary | ICD-10-CM | POA: Diagnosis not present

## 2015-02-21 DIAGNOSIS — S0181XA Laceration without foreign body of other part of head, initial encounter: Secondary | ICD-10-CM | POA: Diagnosis not present

## 2015-02-21 LAB — BASIC METABOLIC PANEL
ANION GAP: 11 (ref 5–15)
BUN: 15 mg/dL (ref 6–20)
CO2: 27 mmol/L (ref 22–32)
Calcium: 9.4 mg/dL (ref 8.9–10.3)
Chloride: 103 mmol/L (ref 101–111)
Creatinine, Ser: 0.61 mg/dL (ref 0.44–1.00)
GFR calc Af Amer: >60 mL/min (ref >60)
Glucose, Bld: 106 mg/dL — ABNORMAL HIGH (ref 65–99)
Potassium: 4 mmol/L (ref 3.5–5.1)
SODIUM: 141 mmol/L (ref 135–145)

## 2015-02-21 LAB — CBC WITH DIFFERENTIAL/PLATELET
Basophils Absolute: 0 10*3/uL (ref 0.0–0.1)
Basophils Relative: 0 % (ref 0–1)
EOS PCT: 0 % (ref 0–5)
Eosinophils Absolute: 0 10*3/uL (ref 0.0–0.7)
HCT: 42 % (ref 36.0–46.0)
Hemoglobin: 14.4 g/dL (ref 12.0–15.0)
LYMPHS ABS: 1.2 10*3/uL (ref 0.7–4.0)
Lymphocytes Relative: 18 % (ref 12–46)
MCH: 30.7 pg (ref 26.0–34.0)
MCHC: 34.3 g/dL (ref 30.0–36.0)
MCV: 89.6 fL (ref 78.0–100.0)
MONO ABS: 0.6 10*3/uL (ref 0.1–1.0)
Monocytes Relative: 9 % (ref 3–12)
NEUTROS PCT: 73 % (ref 43–77)
Neutro Abs: 5 10*3/uL (ref 1.7–7.7)
Platelets: 207 10*3/uL (ref 150–400)
RBC: 4.69 MIL/uL (ref 3.87–5.11)
RDW: 12.6 % (ref 11.5–15.5)
WBC: 6.8 10*3/uL (ref 4.0–10.5)

## 2015-02-21 MED ORDER — HYDROCODONE-ACETAMINOPHEN 5-325 MG PO TABS: ORAL_TABLET | ORAL | Status: DC

## 2015-02-21 MED ORDER — CEPHALEXIN 250 MG PO CAPS: 500.0000 mg | ORAL_CAPSULE | Freq: Once | ORAL | Status: DC

## 2015-02-21 MED ORDER — CEPHALEXIN 500 MG PO CAPS: 500.0000 mg | ORAL_CAPSULE | Freq: Four times a day (QID) | ORAL | Status: DC

## 2015-02-21 NOTE — Discharge Instructions (Signed)
Take vicodin for breakthrough pain, do not drink alcohol, drive, care for children or do other critical tasks while taking vicodin.   Take your antibiotics as directed and to completion. You should never have any leftover antibiotics! Push fluids and stay well hydrated.     Laceration Care, Adult A laceration is a cut that goes through all layers of the skin. The cut goes into the tissue beneath the skin. HOME CARE For stitches (sutures) or staples:  Keep the cut clean and dry.  If you have a bandage (dressing), change it at least once a day. Change the bandage if it gets wet or dirty, or as told by your doctor.  Wash the cut with soap and water 2 times a day. Rinse the cut with water. Pat it dry with a clean towel.  Put a thin layer of medicated cream on the cut as told by your doctor.  You may shower after the first 24 hours. Do not soak the cut in water until the stitches are removed.  Only take medicines as told by your doctor.  Have your stitches or staples removed as told by your doctor. For skin adhesive strips:  Keep the cut clean and dry.  Do not get the strips wet. You may take a bath, but be careful to keep the cut dry.  If the cut gets wet, pat it dry with a clean towel.  The strips will fall off on their own. Do not remove the strips that are still stuck to the cut. For wound glue:  You may shower or take baths. Do not soak or scrub the cut. Do not swim. Avoid heavy sweating until the glue falls off on its own. After a shower or bath, pat the cut dry with a clean towel.  Do not put medicine on your cut until the glue falls off.  If you have a bandage, do not put tape over the glue.  Avoid lots of sunlight or tanning lamps until the glue falls off. Put sunscreen on the cut for the first year to reduce your scar.  The glue will fall off on its own. Do not pick at the glue. You may need a tetanus shot if:  You cannot remember when you had your last tetanus  shot.  You have never had a tetanus shot. If you need a tetanus shot and you choose not to have one, you may get tetanus. Sickness from tetanus can be serious. GET HELP RIGHT AWAY IF:   Your pain does not get better with medicine.  Your arm, hand, leg, or foot loses feeling (numbness) or changes color.  Your cut is bleeding.  Your joint feels weak, or you cannot use your joint.  You have painful lumps on your body.  Your cut is red, puffy (swollen), or painful.  You have a red line on the skin near the cut.  You have yellowish-white fluid (pus) coming from the cut.  You have a fever.  You have a bad smell coming from the cut or bandage.  Your cut breaks open before or after stitches are removed.  You notice something coming out of the cut, such as wood or glass.  You cannot move a finger or toe. MAKE SURE YOU:   Understand these instructions.  Will watch your condition.  Will get help right away if you are not doing well or get worse. Document Released: 03/02/2008 Document Revised: 12/07/2011 Document Reviewed: 03/10/2011 [facility] Patient Information 2015 [place], [place].  This information is not intended to replace advice given to you by your health care provider. Make sure you discuss any questions you have with your health care provider.  Mandibular Fracture  A mandibular fracture is a break in the jawbone. Surgery is often needed to put the jaw back in the right position. Wires may be placed around the teeth to hold the jaw in place while it heals. HOME CARE  Put ice on the injured area.  Put ice in a plastic bag.  Place a towel between your skin and the bag.  Leave the ice on for 15-20 minutes, 03-04 times a day. Do this for the first 2 days.  Only take medicines as told by your doctor.  Eat soft or liquid foods as told by your doctor. Eat plenty of protein.  If your jaws are wired, follow your doctor's directions for wired jaw care.  Sleep on your back  to avoid putting pressure on your jaw.  Avoid exercising so hard that you become short of breath. GET HELP RIGHT AWAY IF:  You have a fever.  You have trouble breathing.  You feel like your airway is tight.  You cannot swallow your spit (saliva).  You make a high-pitched whistling sound when you breathe (wheezing).  You have a bad headache or lose feeling in your face (numbness).  You have bad jaw pain that does not get better with medicine.  Your jaw wires become loose.  You feel sick to your stomach (nauseous) or worried (anxious).  Your puffiness (swelling) or redness gets worse. MAKE SURE YOU:  Understand these instructions.  Will watch your condition.  Will get help right away if you are not doing well or get worse. Document Released: 12/07/2011 Document Reviewed: 12/07/2011 [facility] Patient Information 2015 [place]. This information is not intended to replace advice given to you by your health care provider. Make sure you discuss any questions you have with your health care provider.

## 2015-02-21 NOTE — Consult Note (Signed)
Reason for Consult: Mandible fracture Referring Physician: Urgency room  Amanda Harmon is an 69 y.o. female.  HPI: 68 year old who was involved in a fall and hit the mental area and sustained a mandible fracture. She has a subcondylar fracture as well as a very minimal and nondisplaced body fracture. She is not complaining about any malocclusion. She complains about no pain along the body of the mandible. She has a slight amount of discomfort in the right temporomandibular joint region. She has no new nasal obstruction. No diplopia or vision changes. No difficulty with swallowing or swelling of her tongue. Her neck is without tenderness and has full range of motion. She has no drainage from her ears and no hearing loss.  Past Medical History  Diagnosis Date  . Hyperlipidemia     borderline   . 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 Vertigo   . Mitral valve prolapse 1984  . Cystitis   . Thyroid disease 2015    hypothyroidism    Past Surgical History  Procedure Laterality Date  . Tonsillectomy  1965  . Tubal ligation  1983  . Back surgery  2008  . Breast biopsy  1998    left - benign  . Eye surgery Left 12/01/2013    cataract  . Eye surgery Right 12/15/2013    cataract  . Colonoscopy N/A 01/17/2015    Procedure: COLONOSCOPY;  Surgeon: Rogene Houston, MD;  Location: [facility];  Service: Endoscopy;  Laterality: N/A;  830 - moved to 4/21 @ 8:30    Family History  Problem Relation Age of Onset  . Heart attack Mother 32    used tobacco   . Arthritis Mother     severe   . Heart attack Father 56    used tobacco   . Alcohol abuse Father   . Cancer Brother     prostate   . Osteoporosis Sister     severe     Social History:  reports that she has quit smoking. She does not have any smokeless tobacco history on file. She reports that she drinks alcohol. She reports that she does not use illicit drugs.  Allergies:  Allergies  Allergen Reactions  . Aspirin Other (See  Comments)    Bruising    Medications: I have reviewed the patient's current medications.  Results for orders placed or performed during the hospital encounter of 02/20/15 (from the past 48 hour(s))  CBC with Differential     Status: None   Collection Time: 02/20/15 11:50 PM  Result Value Ref Range   WBC 6.8 4.0 - 10.5 K/uL   RBC 4.69 3.87 - 5.11 MIL/uL   Hemoglobin 14.4 12.0 - 15.0 g/dL   HCT 42.0 36.0 - 46.0 %   MCV 89.6 78.0 - 100.0 fL   MCH 30.7 26.0 - 34.0 pg   MCHC 34.3 30.0 - 36.0 g/dL   RDW 12.6 11.5 - 15.5 %   Platelets 207 150 - 400 K/uL   Neutrophils Relative % 73 43 - 77 %   Neutro Abs 5.0 1.7 - 7.7 K/uL   Lymphocytes Relative 18 12 - 46 %   Lymphs Abs 1.2 0.7 - 4.0 K/uL   Monocytes Relative 9 3 - 12 %   Monocytes Absolute 0.6 0.1 - 1.0 K/uL   Eosinophils Relative 0 0 - 5 %   Eosinophils Absolute 0.0 0.0 - 0.7 K/uL   Basophils Relative 0 0 - 1 %  Basophils Absolute 0.0 0.0 - 0.1 K/uL    Ct Maxillofacial Wo Cm  02/20/2015   CLINICAL DATA:  Fall with chin laceration and tooth fracture. Initial encounter.  EXAM: CT MAXILLOFACIAL WITHOUT CONTRAST  TECHNIQUE: Multidetector CT imaging of the maxillofacial structures was performed. Multiplanar CT image reconstructions were also generated. A small metallic BB was placed on the right temple in order to reliably differentiate right from left.  COMPARISON:  None.  FINDINGS: There is a deep laceration below the chin, extending to bone. Right mandible subcondylar fracture with 2 mm of medial displacement of the condyle. There is a nondisplaced condylar and subcondylar fracture of the left mandible. Nondisplaced fracture through the left mandibular body, best visualized coronally and axially. No condyle dislocation.  No additional fracture, sinus effusion, evidence of globe injury, or postseptal hematoma. The patient is status post bilateral cataract resection. Limited intracranial imaging is negative.  IMPRESSION: 1. Mildly displaced  right mandible subcondylar fracture 2. Nondisplaced left mandible condylar and subcondylar fracture. 3. Nondisplaced left mandibular body fracture.   Electronically Signed   By: Monte Fantasia M.D.   On: 02/20/2015 21:19    Review of Systems  Constitutional: Negative.   HENT: Negative.   Eyes: Negative.   Respiratory: Negative.   Cardiovascular: Negative.   Skin: Negative.    Blood pressure 154/69, pulse 66, temperature 98.3 F (36.8 C), temperature source Oral, resp. rate 16, height 5\' 7"  (1.702 m), weight 58.968 kg (130 lb), SpO2 99 %. Physical Exam  Constitutional: She appears well-developed and well-nourished.  HENT:  Head: Normocephalic and atraumatic.  No trismus. There is no palpable pain along the mandible rim. There is no evidence of any ecchymosis of the gingiva. Her occlusion looks perfect. Tongue and oropharynx are all normal. There is a laceration on her chin that is closed. Neck is without tenderness or swelling. Nose is clear and no evidence of injury  Eyes: Conjunctivae and EOM are normal. Pupils are equal, round, and reactive to light.  Neck: Normal range of motion. Neck supple.    Assessment/Plan: Mandible fracture-we talked about her findings on her CT scan which do show a condylar and body fracture. The body fracture is completely nondisplaced and there is some question as to whether it is a bicortical injury. Her examination reveals no evidence of any ecchymosis of the gingiva and no malocclusion. She has no tenderness along the mandible region where the body fracture is located by x-ray. We talked about options and certainly typically maxillary mandibular fixation would be offered for this injury. Because of her clinical findings, we talked about a soft diet option. She is fairly emphatic that she does not want surgery and will be very diligent about soft diet. She will follow-up in my office in one week. We discussed the signs of a mandible infection. She will be placed  on Keflex and some pain medication if needed but I would recommend Tylenol or Motrin rather than any narcotic.   a [name] 02/21/2015, 12:31 AM

## 2015-02-27 DIAGNOSIS — S02600D Fracture of unspecified part of body of mandible, subsequent encounter for fracture with routine healing: Secondary | ICD-10-CM | POA: Diagnosis not present

## 2015-03-15 DIAGNOSIS — M79671 Pain in right foot: Secondary | ICD-10-CM | POA: Diagnosis not present

## 2015-03-15 DIAGNOSIS — Q828 Other specified congenital malformations of skin: Secondary | ICD-10-CM | POA: Diagnosis not present

## 2015-03-22 DIAGNOSIS — S02600D Fracture of unspecified part of body of mandible, subsequent encounter for fracture with routine healing: Secondary | ICD-10-CM | POA: Diagnosis not present

## 2015-04-16 DIAGNOSIS — E785 Hyperlipidemia, unspecified: Secondary | ICD-10-CM | POA: Diagnosis not present

## 2015-04-16 DIAGNOSIS — E039 Hypothyroidism, unspecified: Secondary | ICD-10-CM | POA: Diagnosis not present

## 2015-04-17 ENCOUNTER — Encounter: Payer: Medicare Other | Admitting: Family Medicine

## 2015-04-17 LAB — LIPID PANEL
CHOL/HDL RATIO: 3.3 ratio
CHOLESTEROL: 217 mg/dL — AB (ref 0–200)
HDL: 66 mg/dL (ref >=46)
LDL Cholesterol: 130 mg/dL — ABNORMAL HIGH (ref 0–99)
Triglycerides: 103 mg/dL (ref <150)
VLDL: 21 mg/dL (ref 0–40)

## 2015-04-25 ENCOUNTER — Ambulatory Visit (INDEPENDENT_AMBULATORY_CARE_PROVIDER_SITE_OTHER): Payer: Medicare Other | Admitting: Family Medicine

## 2015-04-25 ENCOUNTER — Encounter: Payer: Self-pay | Admitting: Family Medicine

## 2015-04-25 VITALS — BP 114/74 | HR 92 | Resp 16 | Ht 67.0 in | Wt 129.4 lb

## 2015-04-25 DIAGNOSIS — N3090 Cystitis, unspecified without hematuria: Secondary | ICD-10-CM

## 2015-04-25 DIAGNOSIS — Z01419 Encounter for gynecological examination (general) (routine) without abnormal findings: Secondary | ICD-10-CM | POA: Diagnosis not present

## 2015-04-25 DIAGNOSIS — W1849XD Other slipping, tripping and stumbling without falling, subsequent encounter: Secondary | ICD-10-CM | POA: Diagnosis not present

## 2015-04-25 DIAGNOSIS — W010XXD Fall on same level from slipping, tripping and stumbling without subsequent striking against object, subsequent encounter: Secondary | ICD-10-CM

## 2015-04-25 DIAGNOSIS — Z0000 Encounter for general adult medical examination without abnormal findings: Secondary | ICD-10-CM

## 2015-04-25 DIAGNOSIS — M810 Age-related osteoporosis without current pathological fracture: Secondary | ICD-10-CM | POA: Diagnosis not present

## 2015-04-25 DIAGNOSIS — E039 Hypothyroidism, unspecified: Secondary | ICD-10-CM | POA: Diagnosis not present

## 2015-04-25 DIAGNOSIS — E559 Vitamin D deficiency, unspecified: Secondary | ICD-10-CM | POA: Diagnosis not present

## 2015-04-25 MED ORDER — SULFAMETHOXAZOLE-TRIMETHOPRIM 800-160 MG PO TABS: 1.0000 | ORAL_TABLET | Freq: Two times a day (BID) | ORAL | Status: DC

## 2015-04-25 NOTE — Patient Instructions (Signed)
Annual wellness in Feb, 2017, call if you need me before  Call for flu vaccine in September  Continue good health habits' I recommend bone builder , especially since you have had a fracture, actually indicated  Antibiotic for as needed use is sent  Pls call if you need referral for eye exam I will enter this  Thanks for choosing [facility], we consider it a privelige to serve you.

## 2015-04-25 NOTE — Progress Notes (Signed)
   Subjective:    Patient ID: Amanda Harmon, female    DOB: 20-Aug-1946, 69 y.o.   MRN: [id number]  HPI Patient is in for annual physical exam. No other health concerns are expressed or addressed at the visit. Recent labs, if available are reviewed. Immunization is reviewed , and  updated if needed.    Review of Systems See HPI     Objective:   Physical Exam BP 114/74 mmHg  Pulse 92  Resp 16  Ht 5\' 7"  (1.702 m)  Wt 129 lb 6.4 oz (58.695 kg)  BMI 20.26 kg/m2  SpO2 96% Pleasant well nourished female, alert and oriented x 3, in no cardio-pulmonary distress. Afebrile. HEENT No facial trauma or asymetry. Sinuses non tender.  Extra occullar muscles intact. External ears normal, tympanic membranes clear.  Neck: supple, no adenopathy,JVD or thyromegaly.No bruits.  Chest: Clear to ascultation bilaterally.No crackles or wheezes. Non tender to palpation  Breast: No asymetry,no masses or lumps. No tenderness. No nipple discharge or inversion. No axillary or supraclavicular adenopathy  Cardiovascular system; Heart sounds normal,  S1 and  S2 ,no S3.  No murmur, or thrill. Apical beat not displaced Peripheral pulses normal.  Abdomen: Soft, non tender, no organomegaly or masses. No bruits. Bowel sounds normal. No guarding, tenderness or rebound.  Rectal:  Deferred had colonoscopy this year  GU: External genitalia normal female genitalia , female distribution of hair. No lesions. Urethral meatus normal in size, no  Prolapse, no lesions visibly  Present. Bladder non tender. Vagina pink and moist , with no visible lesions , discharge present . Adequate pelvic support no  cystocele or rectocele noted Cervix pink and appears healthy, no lesions or ulcerations noted, no discharge noted from os Uterus normal size, no adnexal masses, no cervical motion or adnexal tenderness.   Musculoskeletal exam: Full ROM of spine, hips , shoulders and knees. No deformity ,swelling or  crepitus noted. No muscle wasting or atrophy.   Neurologic: Cranial nerves 2 to 12 intact. Power, tone ,sensation and reflexes normal throughout. No disturbance in gait. No tremor.  Skin: Intact, no ulceration, erythema , scaling or rash noted. Pigmentation normal throughout  Psych; Normal mood and affect. Judgement and concentration normal        Assessment & Plan:  Annual physical exam Annual exam as documented. Counseling done  re healthy lifestyle involving commitment to 150 minutes exercise per week, heart healthy diet, and attaining healthy weight.The importance of adequate sleep also discussed. Regular seat belt use and home safety, is also discussed. Changes in health habits are decided on by the patient with goals and time frames  set for achieving them. Immunization and cancer screening needs are specifically addressed at this visit.   Fall due to stumbling Accidentally fell on pavement on public road [place], sustained bi mandibular fracture, this is her first fall Fall precautions and safety discussed, encouraged her to start a bone builder once more , same was discussed with her endo earlier today  Cystitis Refill on septra for as needed use

## 2015-04-27 DIAGNOSIS — Z0000 Encounter for general adult medical examination without abnormal findings: Secondary | ICD-10-CM | POA: Insufficient documentation

## 2015-04-27 DIAGNOSIS — W010XXA Fall on same level from slipping, tripping and stumbling without subsequent striking against object, initial encounter: Secondary | ICD-10-CM | POA: Insufficient documentation

## 2015-04-27 NOTE — Assessment & Plan Note (Signed)
Refill on septra for as needed use

## 2015-04-27 NOTE — Assessment & Plan Note (Signed)
Accidentally fell on pavement on public road in [place]/[address], sustained bi mandibular fracture, this is her first fall Fall precautions and safety discussed, encouraged her to start a bone builder once more , same was discussed with her endo earlier today

## 2015-04-27 NOTE — Assessment & Plan Note (Signed)

## 2015-07-01 ENCOUNTER — Encounter: Payer: Self-pay | Admitting: Family Medicine

## 2015-07-01 ENCOUNTER — Ambulatory Visit (INDEPENDENT_AMBULATORY_CARE_PROVIDER_SITE_OTHER): Payer: Medicare Other | Admitting: Family Medicine

## 2015-07-01 VITALS — BP 120/84 | HR 77 | Resp 16 | Ht 67.0 in | Wt 136.8 lb

## 2015-07-01 DIAGNOSIS — M8580 Other specified disorders of bone density and structure, unspecified site: Secondary | ICD-10-CM | POA: Diagnosis not present

## 2015-07-01 DIAGNOSIS — Z23 Encounter for immunization: Secondary | ICD-10-CM

## 2015-07-01 DIAGNOSIS — M25531 Pain in right wrist: Secondary | ICD-10-CM | POA: Diagnosis not present

## 2015-07-01 DIAGNOSIS — S62101A Fracture of unspecified carpal bone, right wrist, initial encounter for closed fracture: Secondary | ICD-10-CM | POA: Insufficient documentation

## 2015-07-01 MED ORDER — ALENDRONATE SODIUM 70 MG PO TABS: 70.0000 mg | ORAL_TABLET | ORAL | Status: DC

## 2015-07-01 NOTE — Assessment & Plan Note (Signed)
1 month h/o discomfort with reduced function, ortho to eval and treat

## 2015-07-01 NOTE — Assessment & Plan Note (Signed)
Start weekly fosamax, and continue weight bearing and muscle strengthening exercise

## 2015-07-01 NOTE — Progress Notes (Signed)
   Subjective:    Patient ID: Amanda Harmon, female    DOB: [birth year], 69 y.o.   MRN: [id number]  HPI 1 month h/o right wrist pain with limitation in movement, not improving , despite time and use of alleve. Now favoring her left hand for daily chores like opening cans, states wrist was never swollen , think she injured it while pressing her body weight onto the hands. Now concerned about bone thinning , recent fracture history, and wants a trial of bone builder, never took the oral med and will start with this Plans to increase exercise routine also   Review of Systems See HPI Denies recent fever or chills. Denies sinus pressure, nasal congestion, ear pain or sore throat. Denies chest congestion, productive cough or wheezing. Denies chest pains, palpitations and leg swelling Denies abdominal pain, nausea, vomiting,diarrhea or constipation.   Denies dysuria, frequency, hesitancy or incontinence.  Denies headaches, seizures, numbness, or tingling. Denies depression, anxiety or insomnia. Denies skin break down or rash.        Objective:   Physical Exam BP 120/84 mmHg  Pulse 77  Resp 16  Ht 5\' 7"  (1.702 m)  Wt 136 lb 12.8 oz (62.052 kg)  BMI 21.42 kg/m2  SpO2 99% Patient alert and oriented and in no cardiopulmonary distress.  HEENT: No facial asymmetry, EOMI,   oropharynx pink and moist.  Neck supple no JVD, no mass.  Chest: Clear to auscultation bilaterally.  CVS: S1, S2 no murmurs, no S3.Regular rate.  ABD: Soft non tender.   Ext: No edema  MS: Adequate ROM spine, shoulders, hips and knees.Full ROM of right thumb with no limitation in movement,  localized tenderness lateral aspect of right wrist, no visible swelling or warmth Skin: Intact, no ulcerations or rash noted.  Psych: Good eye contact, normal affect. Memory intact not anxious or depressed appearing.  CNS: CN 2-12 intact, power,  normal throughout.no focal deficits noted.       Assessment & Plan:    Wrist pain, right 1 month h/o discomfort with reduced function, ortho to eval and treat  Osteopenia Start weekly fosamax, and continue weight bearing and muscle strengthening exercise

## 2015-07-01 NOTE — Patient Instructions (Signed)
F/u in February as before, call if you need me sooner  Flu vaccine today  Fosamax weekly to be started for bone health, stop if you develop jaw pain and let me know.  Call with any adverse s/e like heartburn, this is a reason to stop   You are referred to Dr Amedeo Plenty for evaluation and managemwent of right wrist pain  Thanks for choosing [facility], we consider it a privelige to serve you.

## 2015-08-01 DIAGNOSIS — M92211 Osteochondrosis (juvenile) of carpal lunate [Kienbock], right hand: Secondary | ICD-10-CM | POA: Diagnosis not present

## 2015-08-14 DIAGNOSIS — M25531 Pain in right wrist: Secondary | ICD-10-CM | POA: Diagnosis not present

## 2015-08-18 ENCOUNTER — Other Ambulatory Visit: Payer: Self-pay | Admitting: "Endocrinology

## 2015-08-20 DIAGNOSIS — M92211 Osteochondrosis (juvenile) of carpal lunate [Kienbock], right hand: Secondary | ICD-10-CM | POA: Diagnosis not present

## 2015-09-17 DIAGNOSIS — M92211 Osteochondrosis (juvenile) of carpal lunate [Kienbock], right hand: Secondary | ICD-10-CM | POA: Diagnosis not present

## 2015-09-25 IMAGING — MG MM DIGITAL SCREENING BILAT W/ CAD
4 series · 4 of 4 positions shown · non-contrast
Comparison: Previous exam(s).

CLINICAL DATA: Screening.

EXAM:
DIGITAL SCREENING BILATERAL MAMMOGRAM WITH CAD

[L CC]
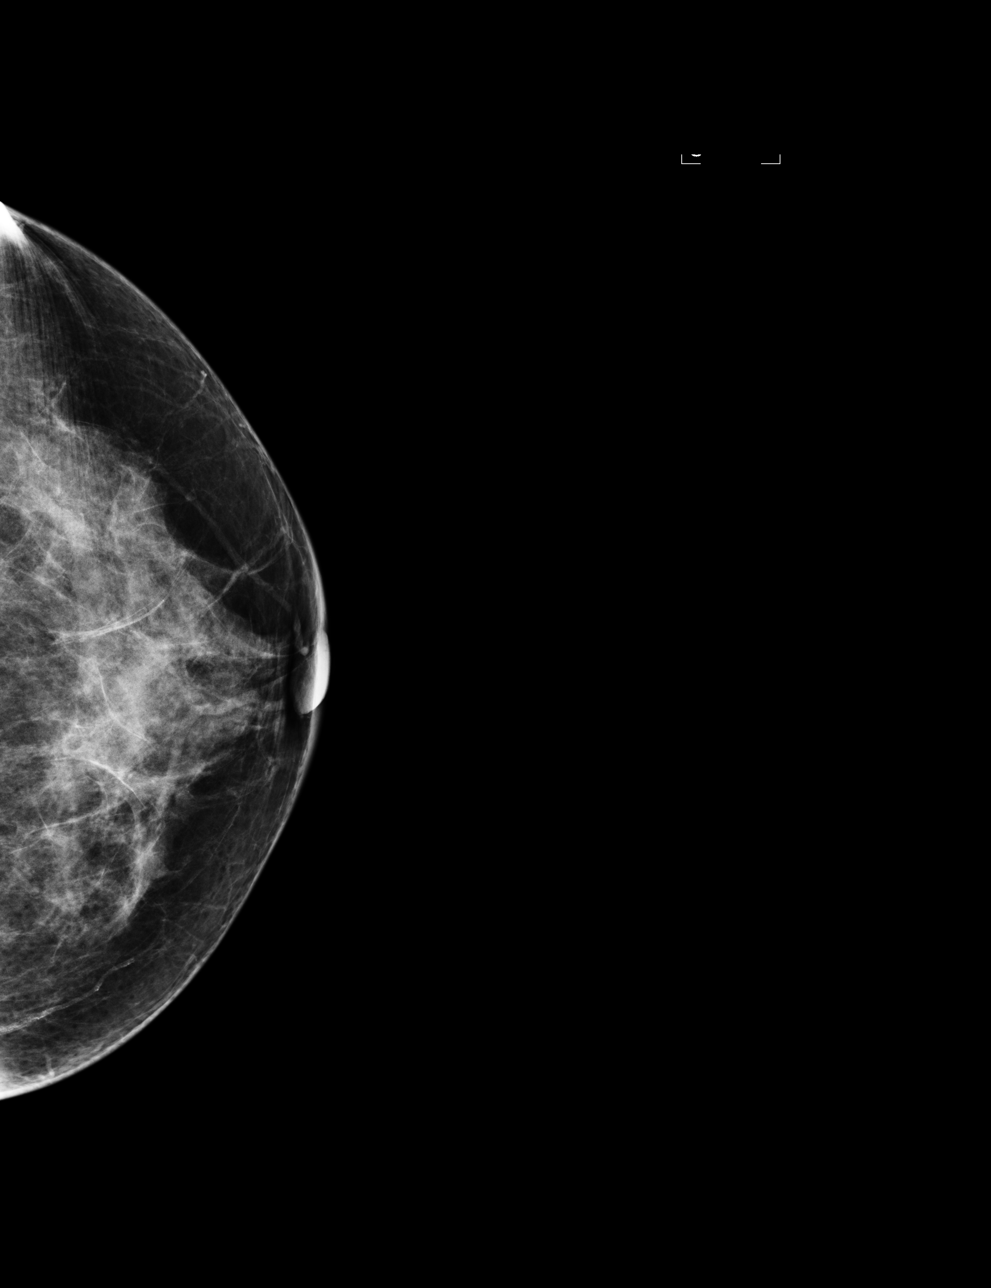

[L MLO]
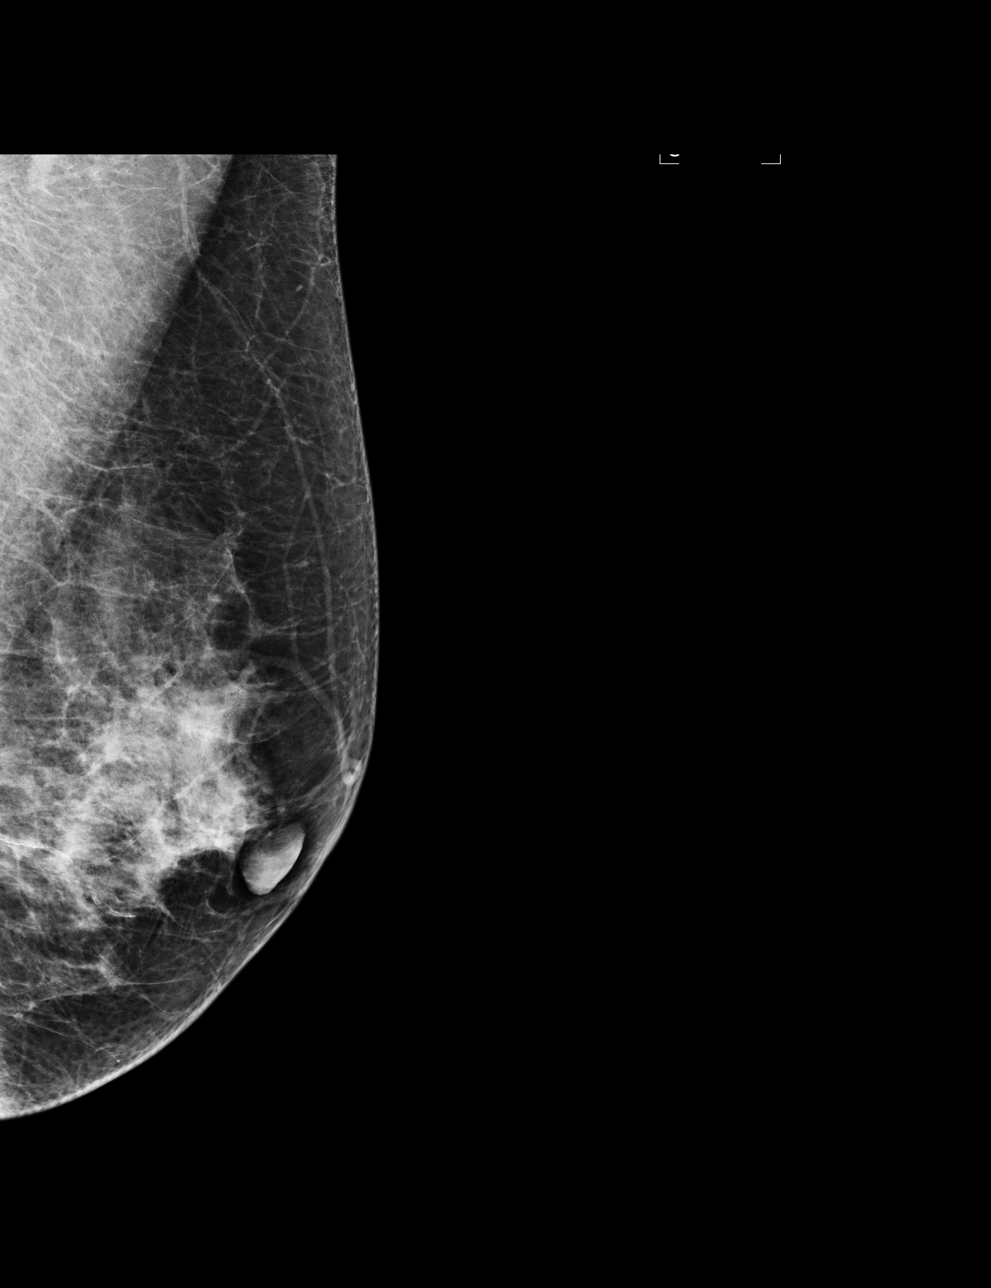

[R CC]
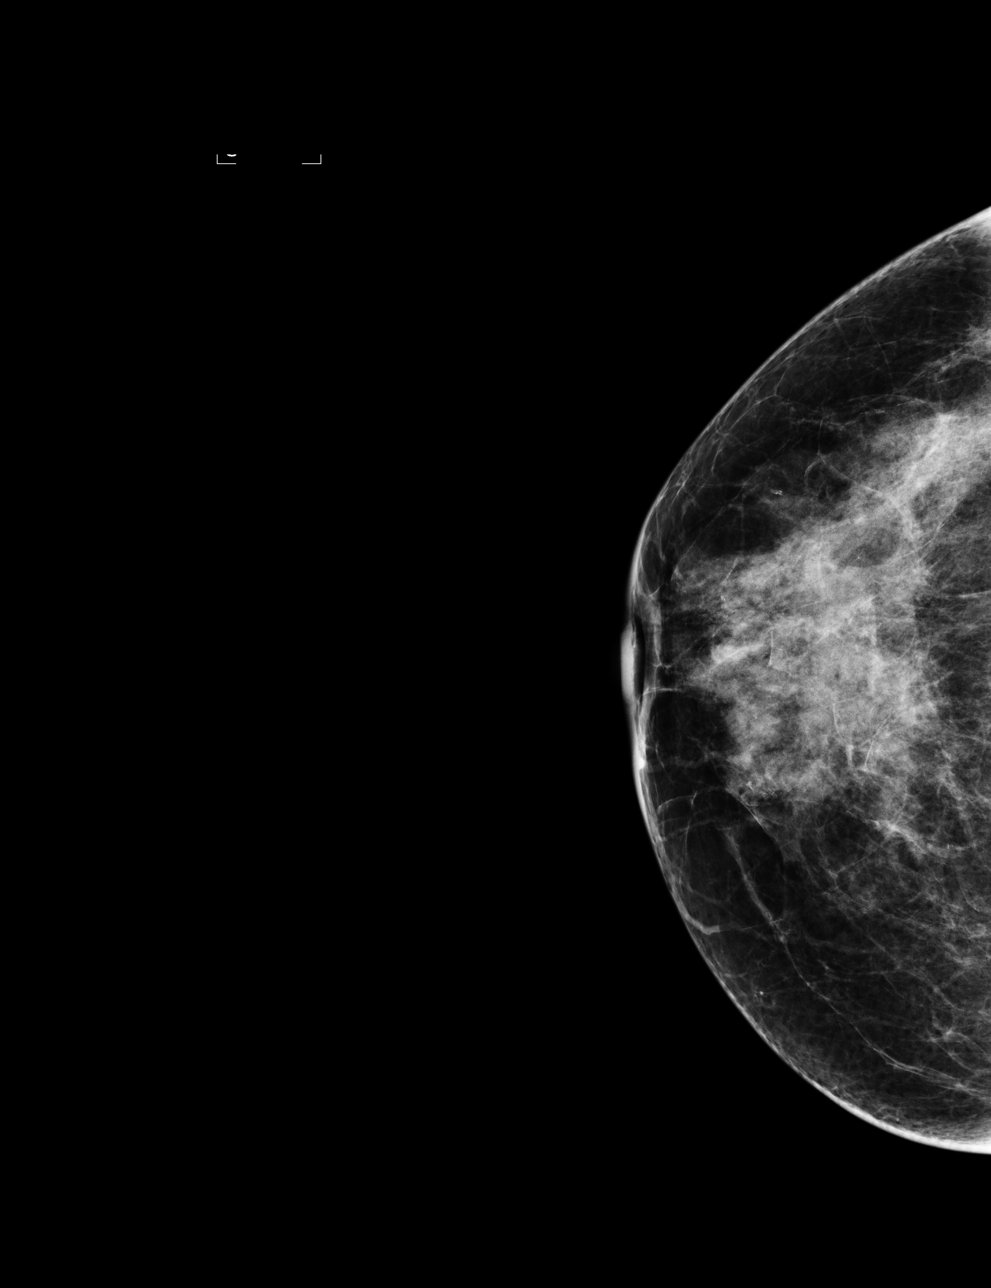

[R MLO]
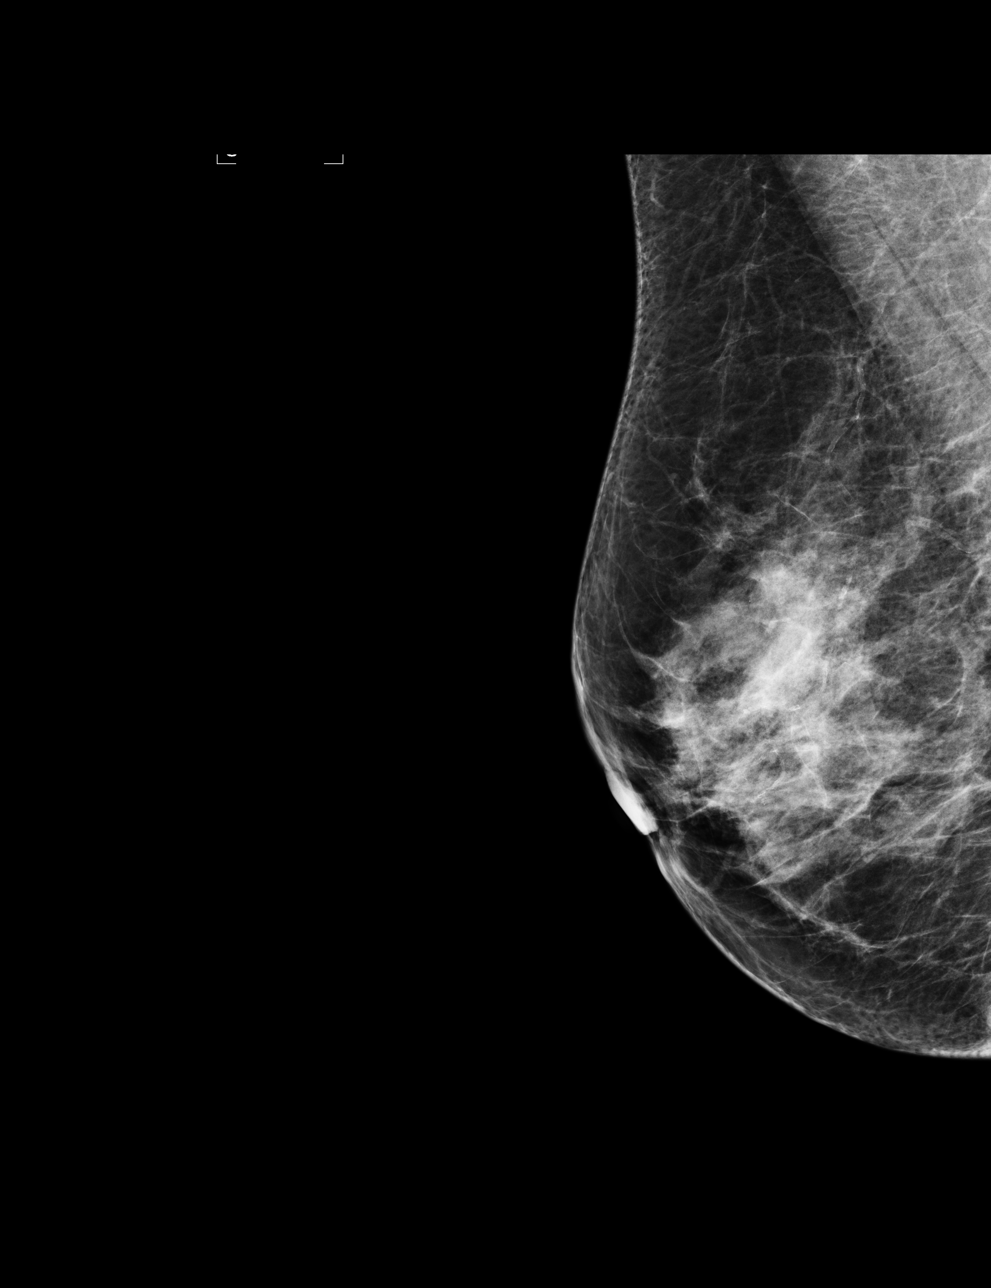

[4 of 4 positions shown; findings below may reference images not displayed]

ACR Breast Density Category c: The breast tissue is heterogeneously
dense, which may obscure small masses.
FINDINGS: There are no findings suspicious for malignancy. Images were
processed with CAD.
IMPRESSION: No mammographic evidence of malignancy. A result letter of this
screening mammogram will be mailed directly to the patient.

RECOMMENDATION:
Screening mammogram in one year. (Code:[id number])

BI-RADS CATEGORY  1: Negative.

## 2015-10-02 DIAGNOSIS — E039 Hypothyroidism, unspecified: Secondary | ICD-10-CM | POA: Diagnosis not present

## 2015-10-02 DIAGNOSIS — E559 Vitamin D deficiency, unspecified: Secondary | ICD-10-CM | POA: Diagnosis not present

## 2015-10-28 ENCOUNTER — Encounter: Payer: Self-pay | Admitting: "Endocrinology

## 2015-10-28 ENCOUNTER — Ambulatory Visit (INDEPENDENT_AMBULATORY_CARE_PROVIDER_SITE_OTHER): Payer: Medicare Other | Admitting: "Endocrinology

## 2015-10-28 VITALS — BP 130/79 | HR 91 | Ht 67.0 in | Wt 130.0 lb

## 2015-10-28 DIAGNOSIS — M810 Age-related osteoporosis without current pathological fracture: Secondary | ICD-10-CM | POA: Diagnosis not present

## 2015-10-28 DIAGNOSIS — E038 Other specified hypothyroidism: Secondary | ICD-10-CM | POA: Diagnosis not present

## 2015-10-28 MED ORDER — VITAMIN D (ERGOCALCIFEROL) 1.25 MG (50000 UNIT) PO CAPS
50000.0000 [IU] | ORAL_CAPSULE | ORAL | Status: DC
Start: 2015-10-28 — End: 2016-06-19

## 2015-10-28 NOTE — Progress Notes (Signed)
Subjective:    Patient ID: Hendricks Limes, female    DOB: 02-21-1946, PCP Tula Nakayama, MD   Past Medical History  Diagnosis Date  . Hyperlipidemia     borderline   . 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 Vertigo   . Mitral valve prolapse 1984  . Cystitis   . Thyroid disease 2015    hypothyroidism   Past Surgical History  Procedure Laterality Date  . Tonsillectomy  1965  . Tubal ligation  1983  . Back surgery  2008  . Breast biopsy  1998    left - benign  . Eye surgery Left 12/01/2013    cataract  . Eye surgery Right 12/15/2013    cataract  . Colonoscopy N/A 01/17/2015    Procedure: COLONOSCOPY;  Surgeon: Rogene Houston, MD;  Location: [facility];  Service: Endoscopy;  Laterality: N/A;  830 - moved to 4/21 @ 8:30   Social History   Social History  . Marital Status: Married    Spouse Name: N/A  . Number of Children: 3  . Years of Education: N/A   Occupational History  . Retired-preschool teacher    Social History Main Topics  . Smoking status: Former Research scientist (life sciences)  . Smokeless tobacco: None  . Alcohol Use: Yes     Comment: socially   . Drug Use: No  . Sexual Activity: Yes   Other Topics Concern  . None   Social History Narrative   Outpatient Encounter Prescriptions as of 10/28/2015  Medication Sig  . alendronate (FOSAMAX) 70 MG tablet Take 1 tablet (70 mg total) by mouth every 7 (seven) days. Take with a full glass of water on an empty stomach.  . Calcium Carbonate-Vit D-Min (CALCIUM 1200 PO) Take 1 tablet by mouth daily.  Marland Kitchen ketotifen (ZADITOR) 0.025 % ophthalmic solution Place 1 drop into both eyes every morning.  Marland Kitchen levothyroxine (SYNTHROID, LEVOTHROID) 50 MCG tablet TAKE ONE TABLET BY MOUTH ONCE DAILY IN THE MORNING  . Multiple Vitamin (MULTIVITAMIN) capsule Take 1 capsule by mouth daily.    . Vitamin D, Ergocalciferol, (DRISDOL) 50000 units CAPS capsule Take 1 capsule (50,000 Units total) by mouth once a week.  . [DISCONTINUED] Vitamin D,  Ergocalciferol, (DRISDOL) 50000 UNITS CAPS capsule TAKE ONE CAPSULE BY MOUTH ONCE A WEEK   No facility-administered encounter medications on file as of 10/28/2015.   ALLERGIES: Allergies  Allergen Reactions  . Aspirin Other (See Comments)    Bruising  . Poison Oak Extract [employer] Poison Oak] Rash   VACCINATION STATUS: Immunization History  Administered Date(s) Administered  . Influenza Split 06/17/2014  . Influenza,inj,Quad PF,36+ Mos 07/20/2013, 07/01/2015  . Pneumococcal Conjugate-13 10/30/2014  . Pneumococcal Polysaccharide-23 03/23/2012  . Tdap 12/09/2012  . Zoster 02/24/2007    HPI  70 yr old female with medical hx as above. she is here to f/u with repeat TFTs. For hypothyroidism, she was put on LT4 50 mcg po qam, she is tolerating well. -since her last visit, she was initiated on alendronate 70 mg by mouth weekly by Dr. Moshe Cipro her primary medical doctor. This is because her orthopedist in [place], being her after x-ray showed an old hand fracture.  She reported that she was treated with Actonel 4 years ago , but stopped.  -During her last several visits with me she declined  bisphosphonates therapy due to the fact that her sister did not tolerate bisphosphonates.  Her last  DXA on 06/13/2014 shows T score -  2.2 on L3 ( L4 has -3.1 , but excluded due to advanced DJD). Spine analysis showed non significant change since 2013 and considered osteopenic. Her hip T-score is -1.8  Her DXA from 2013 showing T score -2.5 on L3 and -3.6 on L4 ( excluded due to advanced DJD).  She denies hx of goiter. Her last menstrual period was at age 70. she has 3 grown children. she smoked for 4 years in college. she never took steroids on chronic basis. she denies cold/heat intolerance. she is active , exercises regularly.  Recently , she fell and sustained non displaced jaw fracture, recent x-ray on her right wrist showed old hand  fracture . she says she fell because she tripped on a  pavement ridge.   Review of Systems Constitutional: no weight gain/loss, no fatigue, no subjective hyperthermia/hypothermia Eyes: no blurry vision, no xerophthalmia ENT: no sore throat, no nodules palpated in throat, no dysphagia/odynophagia, no hoarseness Cardiovascular: no CP/SOB/palpitations/leg swelling Respiratory: no cough/SOB Gastrointestinal: no N/V/D/C Musculoskeletal: no muscle/joint aches, her right wrist in a cast. Skin: no rashes Neurological: no tremors/numbness/tingling/dizziness Psychiatric: no depression/anxiety  Objective:    BP 130/79 mmHg  Pulse 91  Ht 5\' 7"  (1.702 m)  Wt 130 lb (58.968 kg)  BMI 20.36 kg/m2  SpO2 96%  Wt Readings from Last 3 Encounters:  10/28/15 130 lb (58.968 kg)  07/01/15 136 lb 12.8 oz (62.052 kg)  04/25/15 129 lb 6.4 oz (58.695 kg)    Physical Exam  Constitutional: overweight, in NAD Eyes: PERRLA, EOMI, no exophthalmos ENT: moist mucous membranes, no thyromegaly, no cervical lymphadenopathy Cardiovascular: RRR, No MRG Respiratory: CTA B Gastrointestinal: abdomen soft, NT, ND, BS+ Musculoskeletal: no deformities, strength intact in all 4 Skin: moist, warm, no rashes Neurological: no tremor with outstretched hands, DTR normal in all 4  CMP     Component Value Date/Time   NA 141 02/20/2015 2350   K 4.0 02/20/2015 2350   CL 103 02/20/2015 2350   CO2 27 02/20/2015 2350   GLUCOSE 106* 02/20/2015 2350   BUN 15 02/20/2015 2350   CREATININE 0.61 02/20/2015 2350   CREATININE 0.59 10/01/2014 1028   CALCIUM 9.4 02/20/2015 2350   PROT 6.6 07/27/2007 1147   ALBUMIN 4.3 07/27/2007 1147   AST 21 07/27/2007 1147   ALT 19 07/27/2007 1147   ALKPHOS 58 07/27/2007 1147   BILITOT 0.7 07/27/2007 1147   GFRNONAA >60 02/20/2015 2350   GFRAA >60 02/20/2015 2350   Lipid Panel     Component Value Date/Time   CHOL 217* 04/16/2015 0743   TRIG 103 04/16/2015 0743   HDL 66 04/16/2015 0743   CHOLHDL 3.3 04/16/2015 0743   VLDL 21 04/16/2015  0743   LDLCALC 130* 04/16/2015 0743     Assessment & Plan:   1.  hypothyroidism Her TFts are c/w appropriate replacement. I will continue LT4 50 mcg po qam.  - We discussed about correct intake of levothyroxine, at fasting, with water, separated by at least 30 minutes from breakfast, and separated by more than 4 hours from calcium, iron, multivitamins, acid reflux medications (PPIs). -Patient is made aware of the fact that thyroid hormone replacement is needed for life, dose to be adjusted by periodic monitoring of thyroid function tests.  2. Osteoporosis -She was convinced by her orthopedist in [place] to start bisphosphonate therapy after x-ray showed old hand fracture on the right wrist. Even though she declined this therapy before now she plans to stay on it. I encouraged her  to do so. - Since vitamin D is a repeat at 92, I advised her to lower vitamin D 50,000 units 2 monthly instead of weekly after February. -She will continue with calcium carbonate 1200 mg daily with lunch. -Her DXA from 2013 clearly shows spine osteoporosis ( L3 -2.5, ( L4) -3.6 which was excluded due to advanced DJD. Based on her 52 DXA from sept 2015 spine t-score is -2.2 and hip -1.8 with no significant change in BMD since 2013.  Her FRAX analysis is such that her 10 yr probability of Major Osteoporotic Fracture is 9.7% and hip fracture is 1.6%. - She will get repeat DXA in September, 2017 . -she will RTN in 6 month with repeat TFTs and vitamin D.  - I advised patient to maintain close follow up with Tula Nakayama, MD for primary care needs. Follow up plan: Return in about 6 months (around 04/26/2016) for underactive thyroid, follow up with pre-visit labs.  Glade Lloyd, MD Phone: [phone]  Fax: [phone]   10/28/2015, 2:09 PM

## 2015-11-04 ENCOUNTER — Encounter: Payer: Self-pay | Admitting: "Endocrinology

## 2015-11-05 ENCOUNTER — Ambulatory Visit (INDEPENDENT_AMBULATORY_CARE_PROVIDER_SITE_OTHER): Payer: Medicare Other | Admitting: Family Medicine

## 2015-11-05 ENCOUNTER — Encounter: Payer: Self-pay | Admitting: Family Medicine

## 2015-11-05 VITALS — BP 122/70 | HR 98 | Resp 16 | Ht 67.0 in | Wt 130.1 lb

## 2015-11-05 DIAGNOSIS — E038 Other specified hypothyroidism: Secondary | ICD-10-CM

## 2015-11-05 DIAGNOSIS — W1849XD Other slipping, tripping and stumbling without falling, subsequent encounter: Secondary | ICD-10-CM

## 2015-11-05 DIAGNOSIS — Z1159 Encounter for screening for other viral diseases: Secondary | ICD-10-CM | POA: Diagnosis not present

## 2015-11-05 DIAGNOSIS — Z0000 Encounter for general adult medical examination without abnormal findings: Secondary | ICD-10-CM | POA: Diagnosis not present

## 2015-11-05 DIAGNOSIS — E785 Hyperlipidemia, unspecified: Secondary | ICD-10-CM

## 2015-11-05 DIAGNOSIS — S62101S Fracture of unspecified carpal bone, right wrist, sequela: Secondary | ICD-10-CM

## 2015-11-05 DIAGNOSIS — E559 Vitamin D deficiency, unspecified: Secondary | ICD-10-CM

## 2015-11-05 DIAGNOSIS — W010XXD Fall on same level from slipping, tripping and stumbling without subsequent striking against object, subsequent encounter: Secondary | ICD-10-CM

## 2015-11-05 NOTE — Patient Instructions (Addendum)
Annual physical end August or early September, call if you need me sooner  Fasting labs with Dr Liliane Channel mid July  No med changes  LISTEN to your body and continue to  take care of yourself as best able  All the best for 2017, and I am excited about grand baby #4!!!  Will send you a message re thyroid med and fosamax, once I hear from  Dr Dorris Fetch, try to respond if able please

## 2015-11-05 NOTE — Progress Notes (Signed)
Subjective:    Patient ID: Amanda Harmon, female    DOB: 30-Nov-1945, 70 y.o.   MRN: [id number]  HPI Preventive Screening-Counseling & Management   Patient present here today for a Medicare annual wellness visit. 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 No  Over the past two weeks, have you felt little interest or pleasure in doing things? No  Have you lost interest or pleasure in daily life? No  Do you often feel hopeless? No  Do you cry easily over simple problems? No   Activities of Daily Living  In your present state of health, do you have any difficulty performing the following activities?  Driving?: No Managing money?: No Feeding yourself?:No Getting from bed to chair?:No Climbing a flight of stairs?:No Preparing food and eating?:No Bathing or showering?:No Getting dressed?:No Getting to the toilet?:No Using the toilet?:No Moving around from place to place?: No  Fall Risk Assessment In the past year have you fallen or had a near fall?:  Yes Are you currently taking any medications that make you dizzy?:No   Hearing Difficulties: No Do you often ask people to speak up or repeat themselves?:No Do you experience ringing or noises in your ears?: Yes intermittent but resolves spontaneously   Do you have difficulty understanding soft or whispered voices?:No  Cognitive Testing  Alert? Yes Normal Appearance?Yes  Oriented to person? Yes Place? Yes  Time? Yes  Displays appropriate judgment?Yes  Can read the correct time from a watch face? yes Are you having problems remembering things?No age appropriate   Advanced Directives have been discussed with the patient?Yes full code,  and brochure/forms provided    List the Names of Other Physician/Practitioners you currently use:  careteams updated    Indicate any recent Medical Services you may have received from other than [facility] providers in the past year (date may be approximate).   Assessment:    Annual Wellness Exam   Plan:    I have personally reviewed:  The patient's medical and social history  Their use of alcohol, tobacco or illicit drugs  Their current medications and supplements  The patient's functional ability including ADLs,fall risks, home safety risks, cognitive, and hearing and visual impairment  Diet and physical activities  Evidence for depression or mood disorders  The patient's weight, height, BMI, and visual acuity have been recorded in the chart. I have made referrals, counseling, and provided education to the patient based on review of the above and I have provided the patient with a written personalized care plan for preventive services.      Review of Systems     Objective:   Physical Exam  BP 122/70 mmHg  Pulse 98  Resp 16  Ht 5'  7" (1.702 m)  Wt 130 lb 1.9 oz (59.022 kg)  BMI 20.37 kg/m2  SpO2 99%       Assessment & Plan:  Medicare annual wellness visit, subsequent Annual exam as documented. Counseling done  re healthy lifestyle involving commitment to 150 minutes exercise per week, heart healthy diet, and attaining healthy weight.The importance of adequate sleep also discussed. Regular seat belt use and home safety, is also discussed. Changes in health habits are decided on by  the patient with goals and time frames  set for achieving them. Immunization and cancer screening needs are specifically addressed at this visit.

## 2015-11-06 NOTE — Assessment & Plan Note (Signed)

## 2015-11-07 ENCOUNTER — Encounter: Payer: Self-pay | Admitting: Family Medicine

## 2015-11-19 DIAGNOSIS — M92211 Osteochondrosis (juvenile) of carpal lunate [Kienbock], right hand: Secondary | ICD-10-CM | POA: Diagnosis not present

## 2015-11-20 ENCOUNTER — Other Ambulatory Visit: Payer: Self-pay | Admitting: "Endocrinology

## 2015-11-21 ENCOUNTER — Other Ambulatory Visit: Payer: Self-pay | Admitting: Family Medicine

## 2015-11-21 DIAGNOSIS — Z1231 Encounter for screening mammogram for malignant neoplasm of breast: Secondary | ICD-10-CM

## 2015-12-23 ENCOUNTER — Ambulatory Visit (HOSPITAL_COMMUNITY)
Admission: RE | Admit: 2015-12-23 | Discharge: 2015-12-23 | Disposition: A | Discharge status: Home / Self Care | Payer: Medicare Other | Source: Ambulatory Visit | Attending: Family Medicine | Admitting: Family Medicine

## 2015-12-23 DIAGNOSIS — Z1231 Encounter for screening mammogram for malignant neoplasm of breast: Secondary | ICD-10-CM

## 2016-01-17 ENCOUNTER — Telehealth: Payer: Self-pay | Admitting: Family Medicine

## 2016-01-17 NOTE — Telephone Encounter (Signed)
Spoke with patient and discussed options.  She will call her orthopedic and see if she can be seen there 1st..  If not she will go to the ED, due to her past problems with fractures.

## 2016-01-17 NOTE — Telephone Encounter (Signed)
Left foot was twisted a week ago an still very painful asking to be seen, please advise?

## 2016-01-20 DIAGNOSIS — S93602A Unspecified sprain of left foot, initial encounter: Secondary | ICD-10-CM | POA: Diagnosis not present

## 2016-01-20 DIAGNOSIS — M25572 Pain in left ankle and joints of left foot: Secondary | ICD-10-CM | POA: Diagnosis not present

## 2016-01-30 DIAGNOSIS — S63602D Unspecified sprain of left thumb, subsequent encounter: Secondary | ICD-10-CM | POA: Diagnosis not present

## 2016-01-30 DIAGNOSIS — M25572 Pain in left ankle and joints of left foot: Secondary | ICD-10-CM | POA: Diagnosis not present

## 2016-02-18 DIAGNOSIS — M92211 Osteochondrosis (juvenile) of carpal lunate [Kienbock], right hand: Secondary | ICD-10-CM | POA: Diagnosis not present

## 2016-02-23 ENCOUNTER — Other Ambulatory Visit: Payer: Self-pay | Admitting: "Endocrinology

## 2016-04-06 ENCOUNTER — Other Ambulatory Visit: Payer: Self-pay | Admitting: "Endocrinology

## 2016-04-06 DIAGNOSIS — E559 Vitamin D deficiency, unspecified: Secondary | ICD-10-CM | POA: Diagnosis not present

## 2016-04-06 DIAGNOSIS — E038 Other specified hypothyroidism: Secondary | ICD-10-CM | POA: Diagnosis not present

## 2016-04-06 DIAGNOSIS — E785 Hyperlipidemia, unspecified: Secondary | ICD-10-CM | POA: Diagnosis not present

## 2016-04-06 DIAGNOSIS — Z1159 Encounter for screening for other viral diseases: Secondary | ICD-10-CM | POA: Diagnosis not present

## 2016-04-06 LAB — CBC WITH DIFFERENTIAL/PLATELET
BASOS ABS: 55 {cells}/uL (ref 0–200)
Basophils Relative: 1 %
EOS ABS: 165 {cells}/uL (ref 15–500)
Eosinophils Relative: 3 %
HEMATOCRIT: 41.7 % (ref 35.0–45.0)
HEMOGLOBIN: 14.2 g/dL (ref 11.7–15.5)
LYMPHS ABS: 1430 {cells}/uL (ref 850–3900)
Lymphocytes Relative: 26 %
MCH: 30.1 pg (ref 27.0–33.0)
MCHC: 34.1 g/dL (ref 32.0–36.0)
MCV: 88.5 fL (ref 80.0–100.0)
MONOS PCT: 8 %
MPV: 9.8 fL (ref 7.5–12.5)
Monocytes Absolute: 440 cells/uL (ref 200–950)
NEUTROS ABS: 3410 {cells}/uL (ref 1500–7800)
Neutrophils Relative %: 62 %
Platelets: 225 10*3/uL (ref 140–400)
RBC: 4.71 MIL/uL (ref 3.80–5.10)
RDW: 13.3 % (ref 11.0–15.0)
WBC: 5.5 10*3/uL (ref 3.8–10.8)

## 2016-04-06 LAB — LIPID PANEL
CHOL/HDL RATIO: 3.2 ratio (ref <=5.0)
Cholesterol: 218 mg/dL — ABNORMAL HIGH (ref 125–200)
HDL: 68 mg/dL (ref >=46)
LDL CALC: 132 mg/dL — AB (ref <130)
Triglycerides: 92 mg/dL (ref <150)
VLDL: 18 mg/dL (ref <30)

## 2016-04-06 LAB — COMPREHENSIVE METABOLIC PANEL
ALBUMIN: 4.2 g/dL (ref 3.6–5.1)
ALT: 9 U/L (ref 6–29)
AST: 15 U/L (ref 10–35)
Alkaline Phosphatase: 60 U/L (ref 33–130)
BILIRUBIN TOTAL: 0.6 mg/dL (ref 0.2–1.2)
BUN: 16 mg/dL (ref 7–25)
CO2: 29 mmol/L (ref 20–31)
CREATININE: 0.61 mg/dL (ref 0.50–0.99)
Calcium: 9.2 mg/dL (ref 8.6–10.4)
Chloride: 103 mmol/L (ref 98–110)
Glucose, Bld: 86 mg/dL (ref 65–99)
Potassium: 3.9 mmol/L (ref 3.5–5.3)
SODIUM: 141 mmol/L (ref 135–146)
TOTAL PROTEIN: 6.1 g/dL (ref 6.1–8.1)

## 2016-04-06 LAB — T4, FREE: Free T4: 1.2 ng/dL (ref 0.8–1.8)

## 2016-04-06 LAB — TSH: TSH: 1.76 mIU/L

## 2016-04-06 LAB — HEPATITIS C ANTIBODY: HCV Ab: NEGATIVE

## 2016-04-07 ENCOUNTER — Encounter: Payer: Self-pay | Admitting: Family Medicine

## 2016-04-07 LAB — VITAMIN D 25 HYDROXY (VIT D DEFICIENCY, FRACTURES): Vit D, 25-Hydroxy: 54 ng/mL (ref 30–100)

## 2016-04-13 ENCOUNTER — Ambulatory Visit (INDEPENDENT_AMBULATORY_CARE_PROVIDER_SITE_OTHER): Payer: Medicare Other | Admitting: "Endocrinology

## 2016-04-13 ENCOUNTER — Encounter: Payer: Self-pay | Admitting: "Endocrinology

## 2016-04-13 VITALS — BP 127/75 | HR 71 | Ht 67.0 in | Wt 129.0 lb

## 2016-04-13 DIAGNOSIS — E038 Other specified hypothyroidism: Secondary | ICD-10-CM | POA: Diagnosis not present

## 2016-04-13 DIAGNOSIS — E785 Hyperlipidemia, unspecified: Secondary | ICD-10-CM

## 2016-04-13 DIAGNOSIS — M810 Age-related osteoporosis without current pathological fracture: Secondary | ICD-10-CM

## 2016-04-13 MED ORDER — LEVOTHYROXINE SODIUM 50 MCG PO TABS: ORAL_TABLET | ORAL | Status: DC

## 2016-04-13 NOTE — Progress Notes (Signed)
Subjective:    Patient ID: Amanda Harmon, female    DOB: 12-Apr-1946, PCP Tula Nakayama, MD   Past Medical History  Diagnosis Date  . Hyperlipidemia     borderline   . 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 Vertigo   . Mitral valve prolapse 1984  . Cystitis   . Thyroid disease 2015    hypothyroidism   Past Surgical History  Procedure Laterality Date  . Tonsillectomy  1965  . Tubal ligation  1983  . Back surgery  2008  . Breast biopsy  1998    left - benign  . Eye surgery Left 12/01/2013    cataract  . Eye surgery Right 12/15/2013    cataract  . Colonoscopy N/A 01/17/2015    Procedure: COLONOSCOPY;  Surgeon: Rogene Houston, MD;  Location: [facility];  Service: Endoscopy;  Laterality: N/A;  830 - moved to 4/21 @ 8:30  . Spine surgery     Social History   Social History  . Marital Status: Married    Spouse Name: N/A  . Number of Children: 3  . Years of Education: N/A   Occupational History  . Retired-preschool teacher    Social History Main Topics  . Smoking status: Former Research scientist (life sciences)  . Smokeless tobacco: None  . Alcohol Use: Yes     Comment: socially   . Drug Use: No  . Sexual Activity: Yes   Other Topics Concern  . None   Social History Narrative   Outpatient Encounter Prescriptions as of 04/13/2016  Medication Sig  . alendronate (FOSAMAX) 70 MG tablet Take 1 tablet (70 mg total) by mouth every 7 (seven) days. Take with a full glass of water on an empty stomach.  . Calcium Carbonate-Vit D-Min (CALCIUM 1200 PO) Take 1 tablet by mouth daily.  Marland Kitchen ketotifen (ZADITOR) 0.025 % ophthalmic solution Place 1 drop into both eyes every morning.  Marland Kitchen levothyroxine (SYNTHROID, LEVOTHROID) 50 MCG tablet TAKE ONE TABLET BY MOUTH ONCE DAILY IN THE MORNING  . Multiple Vitamin (MULTIVITAMIN) capsule Take 1 capsule by mouth daily.    Marland Kitchen sulfamethoxazole-trimethoprim (BACTRIM DS,SEPTRA DS) 800-160 MG tablet Take 1 tablet by mouth 2 (two) times daily.  . Vitamin D,  Ergocalciferol, (DRISDOL) 50000 units CAPS capsule Take 1 capsule (50,000 Units total) by mouth once a week.  . [DISCONTINUED] levothyroxine (SYNTHROID, LEVOTHROID) 50 MCG tablet TAKE ONE TABLET BY MOUTH ONCE DAILY IN THE MORNING   No facility-administered encounter medications on file as of 04/13/2016.   ALLERGIES: Allergies  Allergen Reactions  . Aspirin Other (See Comments)    Bruising  . Poison Oak Extract [employer] Poison Oak] Rash   VACCINATION STATUS: Immunization History  Administered Date(s) Administered  . Influenza Split 06/17/2014  . Influenza,inj,Quad PF,36+ Mos 07/20/2013, 07/01/2015  . Pneumococcal Conjugate-13 10/30/2014  . Pneumococcal Polysaccharide-23 03/23/2012  . Tdap 12/09/2012  . Zoster 02/24/2007    HPI  70 yr old female with medical hx as above. she is here to f/u with repeat TFTs. For hypothyroidism, she was put on LT4 50 mcg po qam, she is tolerating well. - she remains on alendronate 70 mg by mouth weekly by Dr. Moshe Cipro her primary medical doctor. She continues to follow with her orthopedist in [place],  her after x-ray showed an old hand fracture which she says is not healing well. She is being considered for surgery. She reported that she was treated with Actonel 4 years ago , but stopped.  Her last  DXA on 06/13/2014 shows T score -2.2 on L3 ( L4 has -3.1 , but excluded due to advanced DJD). Spine analysis showed non significant change since 2013 and considered osteopenic. Her hip T-score is -1.8  Her DXA from 2013 showing T score -2.5 on L3 and -3.6 on L4 ( excluded due to advanced DJD).  She denies hx of goiter. Her last menstrual period was at age 57. she has 3 grown children. she smoked for 4 years in college. she never took steroids on chronic basis. she denies cold/heat intolerance. she is active , exercises regularly.  Recently , she fell and sustained non displaced jaw fracture, recent x-ray on her right wrist showed old hand   fracture . she says she fell because she tripped on a pavement ridge.   Review of Systems Constitutional: no weight gain/loss, no fatigue, no subjective hyperthermia/hypothermia Eyes: no blurry vision, no xerophthalmia ENT: no sore throat, no nodules palpated in throat, no dysphagia/odynophagia, no hoarseness Cardiovascular: no CP/SOB/palpitations/leg swelling Respiratory: no cough/SOB Gastrointestinal: no N/V/D/C Musculoskeletal: no muscle/joint aches, her right wrist in a cast. Skin: no rashes Neurological: no tremors/numbness/tingling/dizziness Psychiatric: no depression/anxiety  Objective:    BP 127/75 mmHg  Pulse 71  Ht 5\' 7"  (1.702 m)  Wt 129 lb (58.514 kg)  BMI 20.20 kg/m2  Wt Readings from Last 3 Encounters:  04/13/16 129 lb (58.514 kg)  11/05/15 130 lb 1.9 oz (59.022 kg)  10/28/15 130 lb (58.968 kg)    Physical Exam  Constitutional: overweight, in NAD Eyes: PERRLA, EOMI, no exophthalmos ENT: moist mucous membranes, no thyromegaly, no cervical lymphadenopathy Cardiovascular: RRR, No MRG Respiratory: CTA B Gastrointestinal: abdomen soft, NT, ND, BS+ Musculoskeletal: no deformities, strength intact in all 4 Skin: moist, warm, no rashes Neurological: no tremor with outstretched hands, DTR normal in all 4  CMP     Component Value Date/Time   NA 141 04/06/2016 0910   K 3.9 04/06/2016 0910   CL 103 04/06/2016 0910   CO2 29 04/06/2016 0910   GLUCOSE 86 04/06/2016 0910   BUN 16 04/06/2016 0910   CREATININE 0.61 04/06/2016 0910   CREATININE 0.61 02/20/2015 2350   CALCIUM 9.2 04/06/2016 0910   PROT 6.1 04/06/2016 0910   ALBUMIN 4.2 04/06/2016 0910   AST 15 04/06/2016 0910   ALT 9 04/06/2016 0910   ALKPHOS 60 04/06/2016 0910   BILITOT 0.6 04/06/2016 0910   GFRNONAA >60 02/20/2015 2350   GFRAA >60 02/20/2015 2350   Lipid Panel     Component Value Date/Time   CHOL 218* 04/06/2016 0910   TRIG 92 04/06/2016 0910   HDL 68 04/06/2016 0910   CHOLHDL 3.2  04/06/2016 0910   VLDL 18 04/06/2016 0910   LDLCALC 132* 04/06/2016 0910     Assessment & Plan:   1.  hypothyroidism Her TFts are c/w appropriate replacement. I will continue LT4 50 mcg po qam.  - We discussed about correct intake of levothyroxine, at fasting, with water, separated by at least 30 minutes from breakfast, and separated by more than 4 hours from calcium, iron, multivitamins, acid reflux medications (PPIs). -Patient is made aware of the fact that thyroid hormone replacement is needed for life, dose to be adjusted by periodic monitoring of thyroid function tests.  2. Osteoporosis - I encouraged her to do stay on Fosamax for now. - Since vitamin D is a repeat at 36, I advised her to lower vitamin D 50,000 units  monthly . -She will continue  with calcium carbonate 1200 mg daily with lunch. -Her DXA from 2013 clearly shows spine osteoporosis ( L3 -2.5, ( L4) -3.6 which was excluded due to advanced DJD. Based on her 34 DXA from sept 2015 spine t-score is -2.2 and hip -1.8 with no significant change in BMD since 2013.  Her FRAX analysis is such that her 10 yr probability of Major Osteoporotic Fracture is 9.7% and hip fracture is 1.6%. - She will get repeat DXA in September, 2017 . - she will have repeat bone density in September 2017. -If her Basic response is not satisfactory, she will be considered for Prolia.  3. Hyperlipidemia: - She has family history of hyperlipidemia and coronary artery disease. Her LDL is 132 along with HDL at 68. She would like to avoid therapy for now. Her LDL was 138 in January 2016 .  - I advised patient to maintain close follow up with Tula Nakayama, MD for primary care needs. Follow up plan: Return in about 4 months (around 08/14/2016) for follow up with pre-visit labs, Bone Density.  Glade Lloyd, MD Phone: [phone]  Fax: [phone]   04/13/2016, 11:19 AM

## 2016-05-04 ENCOUNTER — Other Ambulatory Visit: Payer: Self-pay | Admitting: "Endocrinology

## 2016-05-04 DIAGNOSIS — M810 Age-related osteoporosis without current pathological fracture: Secondary | ICD-10-CM

## 2016-05-11 ENCOUNTER — Encounter: Payer: Self-pay | Admitting: Family Medicine

## 2016-05-12 ENCOUNTER — Encounter: Payer: Self-pay | Admitting: "Endocrinology

## 2016-05-21 ENCOUNTER — Encounter: Payer: Medicare Other | Admitting: Family Medicine

## 2016-05-25 ENCOUNTER — Other Ambulatory Visit: Payer: Self-pay | Admitting: Family Medicine

## 2016-05-25 DIAGNOSIS — M8580 Other specified disorders of bone density and structure, unspecified site: Secondary | ICD-10-CM

## 2016-05-28 DIAGNOSIS — M92211 Osteochondrosis (juvenile) of carpal lunate [Kienbock], right hand: Secondary | ICD-10-CM | POA: Diagnosis not present

## 2016-06-19 ENCOUNTER — Other Ambulatory Visit: Payer: Self-pay | Admitting: "Endocrinology

## 2016-06-22 ENCOUNTER — Other Ambulatory Visit: Payer: Self-pay | Admitting: Orthopedic Surgery

## 2016-06-22 DIAGNOSIS — G8918 Other acute postprocedural pain: Secondary | ICD-10-CM | POA: Diagnosis not present

## 2016-06-22 DIAGNOSIS — M87837 Other osteonecrosis of right carpus: Secondary | ICD-10-CM | POA: Diagnosis not present

## 2016-06-22 DIAGNOSIS — G5681 Other specified mononeuropathies of right upper limb: Secondary | ICD-10-CM | POA: Diagnosis not present

## 2016-06-22 DIAGNOSIS — M931 Kienbock's disease of adults: Secondary | ICD-10-CM | POA: Diagnosis not present

## 2016-06-22 HISTORY — PX: CARPECTOMY HAND: SUR194

## 2016-07-07 DIAGNOSIS — M92211 Osteochondrosis (juvenile) of carpal lunate [Kienbock], right hand: Secondary | ICD-10-CM | POA: Diagnosis not present

## 2016-07-07 DIAGNOSIS — Z4789 Encounter for other orthopedic aftercare: Secondary | ICD-10-CM | POA: Diagnosis not present

## 2016-07-10 DIAGNOSIS — Z4789 Encounter for other orthopedic aftercare: Secondary | ICD-10-CM | POA: Diagnosis not present

## 2016-07-10 DIAGNOSIS — M25532 Pain in left wrist: Secondary | ICD-10-CM | POA: Diagnosis not present

## 2016-07-10 DIAGNOSIS — M92211 Osteochondrosis (juvenile) of carpal lunate [Kienbock], right hand: Secondary | ICD-10-CM | POA: Diagnosis not present

## 2016-07-15 ENCOUNTER — Other Ambulatory Visit: Payer: Self-pay | Admitting: Family Medicine

## 2016-07-15 ENCOUNTER — Ambulatory Visit (HOSPITAL_COMMUNITY)
Admission: RE | Admit: 2016-07-15 | Discharge: 2016-07-15 | Disposition: A | Discharge status: Home / Self Care | Payer: Medicare Other | Source: Ambulatory Visit | Attending: Family Medicine | Admitting: Family Medicine

## 2016-07-15 ENCOUNTER — Encounter: Payer: Self-pay | Admitting: Family Medicine

## 2016-07-15 ENCOUNTER — Ambulatory Visit (INDEPENDENT_AMBULATORY_CARE_PROVIDER_SITE_OTHER): Payer: Medicare Other | Admitting: Family Medicine

## 2016-07-15 VITALS — BP 138/82 | HR 80 | Ht 67.0 in | Wt 133.0 lb

## 2016-07-15 DIAGNOSIS — Z23 Encounter for immunization: Secondary | ICD-10-CM | POA: Diagnosis not present

## 2016-07-15 DIAGNOSIS — S32010A Wedge compression fracture of first lumbar vertebra, initial encounter for closed fracture: Secondary | ICD-10-CM | POA: Diagnosis not present

## 2016-07-15 DIAGNOSIS — M5136 Other intervertebral disc degeneration, lumbar region: Secondary | ICD-10-CM | POA: Insufficient documentation

## 2016-07-15 DIAGNOSIS — Y92099 Unspecified place in other non-institutional residence as the place of occurrence of the external cause: Secondary | ICD-10-CM | POA: Diagnosis not present

## 2016-07-15 DIAGNOSIS — W19XXXA Unspecified fall, initial encounter: Secondary | ICD-10-CM | POA: Insufficient documentation

## 2016-07-15 DIAGNOSIS — M4856XA Collapsed vertebra, not elsewhere classified, lumbar region, initial encounter for fracture: Secondary | ICD-10-CM | POA: Diagnosis not present

## 2016-07-15 DIAGNOSIS — Y92009 Unspecified place in unspecified non-institutional (private) residence as the place of occurrence of the external cause: Principal | ICD-10-CM

## 2016-07-15 DIAGNOSIS — M546 Pain in thoracic spine: Secondary | ICD-10-CM | POA: Diagnosis not present

## 2016-07-15 DIAGNOSIS — S32010S Wedge compression fracture of first lumbar vertebra, sequela: Secondary | ICD-10-CM

## 2016-07-15 MED ORDER — OXYCODONE-ACETAMINOPHEN 5-325 MG PO TABS: ORAL_TABLET | ORAL | 0 refills | Status: DC

## 2016-07-15 NOTE — Assessment & Plan Note (Signed)
After obtaining informed consent, the vaccine is  administered by LPN.  

## 2016-07-15 NOTE — Assessment & Plan Note (Signed)
1 week h/o uncontrolled back pain following fall, needs imaging and probably ortho eval and management

## 2016-07-15 NOTE — Progress Notes (Signed)
   Amanda Harmon     MRN: [id number]      DOB: 10/04/1945   HPI Amanda Harmon is here with a 1 week h/o trauma Tripped on rug while watering, on 07/16/2016. Hit left side of head and left ear, has had no headache, no memory loss, notes slight laceration to left earlobe which is healed. Has bruising on right knee, weight bearing with no problem Left wrist broke the fall, has had ortho eval, neg xray and has full ROM of left wrist, currently recovering from right wrist surgery  On 10/14 or 10/15 early am for the first time since the fall, when she awoke to go to the bathroom, the movement occurred acutely at a 10, since then no position of comfort lying down, has relied on advil 200 mg four tabs twice daily, has tried muscle relaxant also , no relief. Had oxycodone remaining from recent surgery this is the only medication affording relief, has used sparingly yesterday, one twice daily, still not attempting to lie flat Pain is non radiating, primarily on right side, does not radiate to lower extremities, no lower extremity weakness or numbness, no incontinence ROS Denies recent fever or chills. Denies sinus pressure, nasal congestion, ear pain or sore throat. Denies chest congestion, productive cough or wheezing. Denies chest pains, palpitations and leg swelling Denies abdominal pain, nausea, vomiting,diarrhea or constipation.   Denies dysuria, frequency, hesitancy or incontinence.  Denies headaches, seizures, numbness, or tingling. Denies depression, anxiety or insomnia. Denies skin break down or rash.   PE  BP 138/82   Pulse 80   Ht 5\' 7"  (1.702 m)   Wt 133 lb (60.3 kg)   SpO2 99%   BMI 20.83 kg/m   Patient alert and oriented and in no cardiopulmonary distress.Pt in pain  HEENT: No facial asymmetry, EOMI,   oropharynx pink and moist.  Neck supple no JVD, no mass.  Chest: Clear to auscultation bilaterally.  CVS: S1, S2 no murmurs, no S3.Regular rate.  ABD: Soft non tender.     Ext: No edema  MS: decreased  ROM spine, normal in  shoulders, hips and knees.  Skin: Intact, no ulcerations or rash noted.  Psych: Good eye contact, normal affect. Memory intact not anxious or depressed appearing.  CNS: CN 2-12 intact, power,  normal throughout.no focal deficits noted.   Assessment & Plan  Backache 1 week h/o uncontrolled back pain following fall, needs imaging and probably ortho eval and management  Fall at home, initial encounter 5 day h/o worsening lower back pain , non radiating rated at 10 when lying down, at a 7 when sitting up, following a fall 1 week ago CT scan thoracic spine positive for a compression fracture. Limited amt of oxycodone prescribed, advised twice daily ibuprofen 800 mg for next 5 days , refer to ortho for management. Pt aware of result, explained no indication for surgery, but prolonged healing time, possibly alternat pain mx with calcitonin and back brace, will arrange asap appt  Need for prophylactic vaccination and inoculation against influenza After obtaining informed consent, the vaccine is  administered by LPN.   Compression fracture of first lumbar vertebra (HCC) Confirmed fracture of L1 with with severe pain and debility, continued care by ortho referral entered

## 2016-07-15 NOTE — Assessment & Plan Note (Signed)
5 day h/o worsening lower back pain , non radiating rated at 10 when lying down, at a 7 when sitting up, following a fall 1 week ago CT scan thoracic spine positive for a compression fracture. Limited amt of oxycodone prescribed, advised twice daily ibuprofen 800 mg for next 5 days , refer to ortho for management. Pt aware of result, explained no indication for surgery, but prolonged healing time, possibly alternat pain mx with calcitonin and back brace, will arrange asap appt

## 2016-07-15 NOTE — Patient Instructions (Signed)
F/u as before, call if you need me before  You are referred for CT scan of thoracic spine  Script for oxycodone for limited time, written, three times daily   Use ibuoprofen 200 mg four tablets twice daily for next 5 days

## 2016-07-15 NOTE — Assessment & Plan Note (Signed)
Confirmed fracture of L1 with with severe pain and debility, continued care by ortho referral entered

## 2016-07-18 DIAGNOSIS — S32010A Wedge compression fracture of first lumbar vertebra, initial encounter for closed fracture: Secondary | ICD-10-CM | POA: Diagnosis not present

## 2016-07-22 ENCOUNTER — Other Ambulatory Visit: Payer: Self-pay | Admitting: "Endocrinology

## 2016-07-22 DIAGNOSIS — M810 Age-related osteoporosis without current pathological fracture: Secondary | ICD-10-CM

## 2016-07-28 DIAGNOSIS — Z4789 Encounter for other orthopedic aftercare: Secondary | ICD-10-CM | POA: Diagnosis not present

## 2016-07-28 DIAGNOSIS — M92211 Osteochondrosis (juvenile) of carpal lunate [Kienbock], right hand: Secondary | ICD-10-CM | POA: Diagnosis not present

## 2016-08-05 ENCOUNTER — Ambulatory Visit (HOSPITAL_COMMUNITY)
Admission: RE | Admit: 2016-08-05 | Discharge: 2016-08-05 | Disposition: A | Discharge status: Home / Self Care | Payer: Medicare Other | Source: Ambulatory Visit | Attending: "Endocrinology | Admitting: "Endocrinology

## 2016-08-05 DIAGNOSIS — R296 Repeated falls: Secondary | ICD-10-CM | POA: Insufficient documentation

## 2016-08-05 DIAGNOSIS — M8588 Other specified disorders of bone density and structure, other site: Secondary | ICD-10-CM | POA: Insufficient documentation

## 2016-08-05 DIAGNOSIS — M85851 Other specified disorders of bone density and structure, right thigh: Secondary | ICD-10-CM | POA: Diagnosis not present

## 2016-08-05 DIAGNOSIS — Z780 Asymptomatic menopausal state: Secondary | ICD-10-CM | POA: Insufficient documentation

## 2016-08-05 DIAGNOSIS — M810 Age-related osteoporosis without current pathological fracture: Secondary | ICD-10-CM | POA: Diagnosis not present

## 2016-08-05 DIAGNOSIS — Z79899 Other long term (current) drug therapy: Secondary | ICD-10-CM | POA: Insufficient documentation

## 2016-08-06 ENCOUNTER — Other Ambulatory Visit (HOSPITAL_COMMUNITY): Payer: Self-pay | Admitting: Interventional Radiology

## 2016-08-06 DIAGNOSIS — IMO0001 Reserved for inherently not codable concepts without codable children: Secondary | ICD-10-CM

## 2016-08-06 DIAGNOSIS — M4850XS Collapsed vertebra, not elsewhere classified, site unspecified, sequela of fracture: Principal | ICD-10-CM

## 2016-08-07 ENCOUNTER — Telehealth: Payer: Self-pay

## 2016-08-07 ENCOUNTER — Other Ambulatory Visit: Payer: Self-pay | Admitting: "Endocrinology

## 2016-08-07 ENCOUNTER — Encounter: Payer: Self-pay | Admitting: "Endocrinology

## 2016-08-07 DIAGNOSIS — M8000XD Age-related osteoporosis with current pathological fracture, unspecified site, subsequent encounter for fracture with routine healing: Secondary | ICD-10-CM

## 2016-08-07 DIAGNOSIS — E559 Vitamin D deficiency, unspecified: Secondary | ICD-10-CM | POA: Insufficient documentation

## 2016-08-07 DIAGNOSIS — E785 Hyperlipidemia, unspecified: Secondary | ICD-10-CM

## 2016-08-07 NOTE — Telephone Encounter (Signed)
I will add vitamin D and CMP.

## 2016-08-07 NOTE — Telephone Encounter (Signed)
Does this pt need any other labs besides the TSH & Free t4?

## 2016-08-10 ENCOUNTER — Encounter: Payer: Self-pay | Admitting: Family Medicine

## 2016-08-10 ENCOUNTER — Other Ambulatory Visit: Payer: Self-pay | Admitting: "Endocrinology

## 2016-08-10 DIAGNOSIS — E038 Other specified hypothyroidism: Secondary | ICD-10-CM | POA: Diagnosis not present

## 2016-08-11 LAB — T4, FREE: FREE T4: 1.4 ng/dL (ref 0.8–1.8)

## 2016-08-11 LAB — TSH: TSH: 0.86 m[IU]/L

## 2016-08-14 ENCOUNTER — Ambulatory Visit (INDEPENDENT_AMBULATORY_CARE_PROVIDER_SITE_OTHER): Payer: Medicare Other | Admitting: "Endocrinology

## 2016-08-14 ENCOUNTER — Ambulatory Visit: Payer: Medicare Other | Admitting: "Endocrinology

## 2016-08-14 ENCOUNTER — Encounter: Payer: Self-pay | Admitting: "Endocrinology

## 2016-08-14 VITALS — BP 127/72 | HR 80 | Ht 67.0 in | Wt 131.0 lb

## 2016-08-14 DIAGNOSIS — E038 Other specified hypothyroidism: Secondary | ICD-10-CM

## 2016-08-14 DIAGNOSIS — E559 Vitamin D deficiency, unspecified: Secondary | ICD-10-CM | POA: Diagnosis not present

## 2016-08-14 DIAGNOSIS — M8000XD Age-related osteoporosis with current pathological fracture, unspecified site, subsequent encounter for fracture with routine healing: Secondary | ICD-10-CM | POA: Diagnosis not present

## 2016-08-14 MED ORDER — VITAMIN D3 125 MCG (5000 UT) PO CAPS: 5000.0000 [IU] | ORAL_CAPSULE | Freq: Every day | ORAL | 0 refills | Status: DC

## 2016-08-14 NOTE — Progress Notes (Signed)
Subjective:    Patient ID: Amanda Harmon, female    DOB: Jul 13, 1946, PCP Tula Nakayama, MD   Past Medical History:  Diagnosis Date  . 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 Cystitis   . Hyperlipidemia    borderline   . Mitral valve prolapse 1984  . Thyroid disease 2015   hypothyroidism  . Vertigo    Past Surgical History:  Procedure Laterality Date  . BACK SURGERY  2008  . BREAST BIOPSY  1998   left - benign  . COLONOSCOPY N/A 01/17/2015   Procedure: COLONOSCOPY;  Surgeon: Rogene Houston, MD;  [facility];  Service: Endoscopy;  Laterality: N/A;  830 - moved to 4/21 @ 8:30  . EYE SURGERY Left 12/01/2013   cataract  . EYE SURGERY Right 12/15/2013   cataract  . SPINE SURGERY    . TONSILLECTOMY  1965  . TUBAL LIGATION  1983   Social History   Social History  . Marital status: Married    Spouse name: N/A  . Number of children: 3  . Years of education: N/A   Occupational History  . Retired-preschool teacher Retired   Social History Main Topics  . Smoking status: Former Research scientist (life sciences)  . Smokeless tobacco: None  . Alcohol use Yes     Comment: socially   . Drug use: No  . Sexual activity: Yes   Other Topics Concern  . None   Social History Narrative  . None   Outpatient Encounter Prescriptions as of 08/14/2016  Medication Sig  . alendronate (FOSAMAX) 70 MG tablet TAKE ONE TABLET BY MOUTH ONCE A WEEK TAKE WITH A FULL GLASS OF WATER ON AN EMPTY STOMACH  . levothyroxine (SYNTHROID, LEVOTHROID) 50 MCG tablet TAKE ONE TABLET BY MOUTH ONCE DAILY IN THE MORNING  . Ascorbic Acid (VITAMIN C) 1000 MG tablet Take 1,000 mg by mouth daily.  . Calcium Carbonate-Vit D-Min (CALCIUM 1200 PO) Take 1 tablet by mouth daily.  . Cholecalciferol (VITAMIN D3) 5000 units CAPS Take 1 capsule (5,000 Units total) by mouth daily.  Marland Kitchen docusate sodium (COLACE) 100 MG capsule Take 100 mg by mouth 2 (two) times daily.  Marland Kitchen ketotifen (ZADITOR) 0.025 % ophthalmic solution Place 1  drop into both eyes every morning.  . Multiple Vitamin (MULTIVITAMIN) capsule Take 1 capsule by mouth daily.    Marland Kitchen oxyCODONE-acetaminophen (ROXICET) 5-325 MG tablet One tablet three times daily as needed, for uncontrolled pain (Patient not taking: Reported on 08/14/2016)  . pyridOXINE (VITAMIN B-6) 100 MG tablet Take 100 mg by mouth daily.  . Vitamin D, Ergocalciferol, (DRISDOL) 50000 units CAPS capsule TAKE ONE CAPSULE BY MOUTH ONCE A WEEK (Patient not taking: Reported on 08/14/2016)   No facility-administered encounter medications on file as of 08/14/2016.    ALLERGIES: Allergies  Allergen Reactions  . Aspirin Other (See Comments)    Bruising  . Poison Oak Extract [Poison Oak Extract] Rash   VACCINATION STATUS: Immunization History  Administered Date(s) Administered  . Influenza Split 06/17/2014  . Influenza,inj,Quad PF,36+ Mos 07/20/2013, 07/01/2015, 07/15/2016  . Pneumococcal Conjugate-13 10/30/2014  . Pneumococcal Polysaccharide-23 03/23/2012  . Tdap 12/09/2012  . Zoster 02/24/2007    HPI  70 yr old female with medical hx as above. she is here to f/u  For her hypothyroidism and osteoporosis with repeat TFTs. For hypothyroidism, she was put on LT4 50 mcg po qam, she is tolerating well. - she remains on alendronate 70 mg by mouth weekly  by Dr. Moshe Cipro her primary medical doctor. She continues to follow with her orthopedist in [place],  her after x-ray showed an old hand fracture which she says is not healing well. - Since her last visit, she has had a mild L1 compression fracture for which she is discussing her options of therapy with her orthopedic surgeon.  She reported that she was treated with Actonel 5 years ago , but stopped.  - Based on her repeat bone density , on 08/15/2016 she is regaining significant (9.5%) bone mass on the spine, now in the osteopenia range reversing from osteoporosis.  Her prior DXA on 06/13/2014 shows T score -2.2 on L3 ( L4 has -3.1 , but  excluded due to advanced DJD). - Dual  Femur analysis showed non significant change since 2015 and still  considered osteopenic with a T score of -1.7 .   She denies hx of goiter. Her last menstrual period was at age 50. she has 3 grown children. she smoked for 4 years in college. she never took steroids on chronic basis. she denies cold/heat intolerance. she is active , exercises regularly.  Last year, while walking,  she fell and sustained non displaced jaw fracture, recent x-ray on her right wrist showed old hand  fracture . she says she fell because she tripped on a pavement ridge.   Review of Systems Constitutional: no weight gain/loss, no fatigue, no subjective hyperthermia/hypothermia Eyes: no blurry vision, no xerophthalmia ENT: no sore throat, no nodules palpated in throat, no dysphagia/odynophagia, no hoarseness Cardiovascular: no CP/SOB/palpitations/leg swelling Respiratory: no cough/SOB Gastrointestinal: no N/V/D/C Musculoskeletal: no muscle/joint aches, her right wrist in a cast. Skin: no rashes Neurological: no tremors/numbness/tingling/dizziness Psychiatric: no depression/anxiety  Objective:    BP 127/72   Pulse 80   Ht 5\' 7"  (1.702 m)   Wt 131 lb (59.4 kg)   BMI 20.52 kg/m   Wt Readings from Last 3 Encounters:  08/14/16 131 lb (59.4 kg)  07/15/16 133 lb (60.3 kg)  04/13/16 129 lb (58.5 kg)    Physical Exam  Constitutional: overweight, in NAD Eyes: PERRLA, EOMI, no exophthalmos ENT: moist mucous membranes, no thyromegaly, no cervical lymphadenopathy Cardiovascular: RRR, No MRG Respiratory: CTA B Gastrointestinal: abdomen soft, NT, ND, BS+ Musculoskeletal: no deformities, strength intact in all 4 Skin: moist, warm, no rashes Neurological: no tremor with outstretched hands, DTR normal in all 4  CMP     Component Value Date/Time   NA 141 04/06/2016 0910   K 3.9 04/06/2016 0910   CL 103 04/06/2016 0910   CO2 29 04/06/2016 0910   GLUCOSE 86 04/06/2016  0910   BUN 16 04/06/2016 0910   CREATININE 0.61 04/06/2016 0910   CALCIUM 9.2 04/06/2016 0910   PROT 6.1 04/06/2016 0910   ALBUMIN 4.2 04/06/2016 0910   AST 15 04/06/2016 0910   ALT 9 04/06/2016 0910   ALKPHOS 60 04/06/2016 0910   BILITOT 0.6 04/06/2016 0910   GFRNONAA >60 02/20/2015 2350   GFRAA >60 02/20/2015 2350   Lipid Panel     Component Value Date/Time   CHOL 218 (H) 04/06/2016 0910   TRIG 92 04/06/2016 0910   HDL 68 04/06/2016 0910   CHOLHDL 3.2 04/06/2016 0910   VLDL 18 04/06/2016 0910   LDLCALC 132 (H) 04/06/2016 0910     Assessment & Plan:   1.  hypothyroidism Her TFts are c/w appropriate replacement. I will continue LT4 50 mcg po qam.  - We discussed about correct intake of levothyroxine, at  fasting, with water, separated by at least 30 minutes from breakfast, and separated by more than 4 hours from calcium, iron, multivitamins, acid reflux medications (PPIs). -Patient is made aware of the fact that thyroid hormone replacement is needed for life, dose to be adjusted by periodic monitoring of thyroid function tests.  2. Osteoporosis - Based on her most recent bone density, she is regaining some bone mass on the spine and no change Dual Femur BMD analysis. - This is better than expected outcome of therapy with alendronate. -  I encouraged her to stay on  alendronate 70 mg weekly for now. She has a propensity for falling, and  I advised her on precautions to avoid falling including walking on flat surface or treadmill instead of on outside pavements, and pick up  Strengthening exercises for back muscles. - She is status post therapy with vitamin D 50,000 units weekly for 6 months, her last vitamin D level was 54. - I advised her to continue vitamin D3 5,000 units daily until next visit in 6 months. -She will continue with calcium carbonate 1200 mg daily with lunch.  - she will have repeat bone density in September 2019. -If her BMD response is not satisfactory, she  will be considered for Prolia.  3. Hyperlipidemia: - She has family history of hyperlipidemia and coronary artery disease. Her LDL is 132 along with HDL at 68. She would like to avoid therapy for now. Her LDL was 138 in January 2016 .  - I advised patient to maintain close follow up with Tula Nakayama, MD for primary care needs. Follow up plan: Return in about 6 months (around 02/11/2017) for follow up with pre-visit labs.  Glade Lloyd, MD Phone: [phone]  Fax: [phone]   08/14/2016, 11:16 AM

## 2016-08-19 ENCOUNTER — Ambulatory Visit (HOSPITAL_COMMUNITY): Payer: Medicare Other

## 2016-08-19 ENCOUNTER — Other Ambulatory Visit (HOSPITAL_COMMUNITY): Payer: Self-pay | Admitting: Interventional Radiology

## 2016-08-19 ENCOUNTER — Ambulatory Visit (HOSPITAL_COMMUNITY)
Admission: RE | Admit: 2016-08-19 | Discharge: 2016-08-19 | Disposition: A | Discharge status: Home / Self Care | Payer: Medicare Other | Source: Ambulatory Visit | Attending: Interventional Radiology | Admitting: Interventional Radiology

## 2016-08-19 DIAGNOSIS — IMO0001 Reserved for inherently not codable concepts without codable children: Secondary | ICD-10-CM

## 2016-08-19 DIAGNOSIS — S32000A Wedge compression fracture of unspecified lumbar vertebra, initial encounter for closed fracture: Secondary | ICD-10-CM | POA: Diagnosis not present

## 2016-08-19 DIAGNOSIS — M4850XS Collapsed vertebra, not elsewhere classified, site unspecified, sequela of fracture: Principal | ICD-10-CM

## 2016-08-19 HISTORY — PX: IR GENERIC HISTORICAL: IMG1180011

## 2016-08-27 ENCOUNTER — Ambulatory Visit (INDEPENDENT_AMBULATORY_CARE_PROVIDER_SITE_OTHER): Payer: Medicare Other | Admitting: Family Medicine

## 2016-08-27 ENCOUNTER — Encounter: Payer: Self-pay | Admitting: Family Medicine

## 2016-08-27 VITALS — BP 120/82 | HR 80 | Resp 16 | Ht 67.0 in | Wt 128.8 lb

## 2016-08-27 DIAGNOSIS — Z9189 Other specified personal risk factors, not elsewhere classified: Secondary | ICD-10-CM

## 2016-08-27 DIAGNOSIS — R109 Unspecified abdominal pain: Secondary | ICD-10-CM

## 2016-08-27 DIAGNOSIS — Z1211 Encounter for screening for malignant neoplasm of colon: Secondary | ICD-10-CM | POA: Diagnosis not present

## 2016-08-27 DIAGNOSIS — E785 Hyperlipidemia, unspecified: Secondary | ICD-10-CM

## 2016-08-27 DIAGNOSIS — M8000XD Age-related osteoporosis with current pathological fracture, unspecified site, subsequent encounter for fracture with routine healing: Secondary | ICD-10-CM

## 2016-08-27 DIAGNOSIS — Z9181 History of falling: Secondary | ICD-10-CM

## 2016-08-27 DIAGNOSIS — E559 Vitamin D deficiency, unspecified: Secondary | ICD-10-CM

## 2016-08-27 NOTE — Patient Instructions (Signed)
Annual wellness in 4 months, call if you need me sooner  Fasting lipid, and vit D in 4 months.  Call for gI referral if you continue to have lower abdominal symptoms and loose stool. No hidden blood in stool and exam today is normaal  All the best, enjoying swimming in a cap!  Thank you  for choosing [facility]. We consider it a privelige to serve you.  Delivering excellent health care in a caring and  compassionate way is our goal.  Partnering with you,  so that together we can achieve this goal is our strategy.  I will send a message to Dr Dorris Fetch re your question, make you aware of response

## 2016-08-30 DIAGNOSIS — Z9181 History of falling: Secondary | ICD-10-CM | POA: Insufficient documentation

## 2016-08-30 DIAGNOSIS — R109 Unspecified abdominal pain: Secondary | ICD-10-CM | POA: Insufficient documentation

## 2016-08-30 NOTE — Assessment & Plan Note (Signed)
Intermittent, started following use of opioids, abdominal and rectal exam at visit are normal , as is her pelvic exam. Normal colonoscopy in past 2 years, no change in stool caliber. Pt to call back in 4 to 8 weeks if persists for GI eval, reassurance given at this visit, no intervention or new medication started

## 2016-08-30 NOTE — Progress Notes (Signed)
   Amanda Harmon     MRN: [id number]      DOB: [birth year]   HPI Amanda Harmon is here for follow up and re-evaluation of chronic medical conditions, medication management and review of any available recent lab and radiology data.  Preventive health is updated, specifically  Cancer screening and Immunization.   Questions or concerns regarding consultations or procedures which the PT has had in the interim are  Addressed. Improved as far as low back pain is concerned from recent compression fracture, on no pain medication Disappointed with recurrent fracture history, and though her dexa is showing improvement with oral agent , wonders if parenteral would be more beneficial, wants endo opine The PT denies any adverse reactions to current medications since the last visit.  C/o new cramping lower abd pian intermittently, had constipation on opioids, pain is relieved with BM and is not associated with change in stool caliber or visible rectal blood ROS Denies recent fever or chills. Denies sinus pressure, nasal congestion, ear pain or sore throat. Denies chest congestion, productive cough or wheezing. Denies chest pains, palpitations and leg swelling   Denies dysuria, frequency, hesitancy or incontinence.  Denies headaches, seizures, numbness, or tingling. Denies depression, anxiety or insomnia. Denies skin break down or rash.   PE  BP 120/82   Pulse 80   Resp 16   Ht 5\' 7"  (1.702 m)   Wt 128 lb 12.8 oz (58.4 kg)   BMI 20.17 kg/m   Patient alert and oriented and in no cardiopulmonary distress.  HEENT: No facial asymmetry, EOMI,   oropharynx pink and moist.  Neck supple no JVD, no mass.  Chest: Clear to auscultation bilaterally. Breast: No mass, no asymmetry, no nipple inversion.No nipple discharge No supraclavicular or axillary adenopathy CVS: S1, S2 no murmurs, no S3.Regular rate.  ABD: Soft non tender. No organomegaly or mass, normal BS. Rectal: no mass, heme negative  stool  Pelvic: Normal ext female genitalia, no rash or skin breakdown Vaginal walls atrophic, cervix healthy, no discharge from os, uterus normal sized, no adnexal tenderness or mass, no cervical motion or adnexal tenderness  Ext: No edema  MS: decreased  ROM thoracolumbar  spine, normal in shoulders, hips and knees.  Skin: Intact, no ulcerations or rash noted.  Psych: Good eye contact, normal affect. Memory intact not anxious or depressed appearing.  CNS: CN 2-12 intact, power,  normal throughout.no focal deficits noted.   Assessment & Plan  Abdominal cramping Intermittent, started following use of opioids, abdominal and rectal exam at visit are normal , as is her pelvic exam. Normal colonoscopy in past 2 years, no change in stool caliber. Pt to call back in 4 to 8 weeks if persists for GI eval, reassurance given at this visit, no intervention or new medication started  At high risk for injury related to fall Currently on poral gaent for osteoporsis, response seen with therapy, however concerned as she has had 3 fractures in past 2 years, jaw, wrist, spine, becoming fearful in terms of walking in public, afraid of another fracture, encouraged swimming, will message her endo re any possible value of changing to pareneteral therapy for her bone disease

## 2016-08-30 NOTE — Assessment & Plan Note (Signed)
Currently on poral gaent for osteoporsis, response seen with therapy, however concerned as she has had 3 fractures in past 2 years, jaw, wrist, spine, becoming fearful in terms of walking in public, afraid of another fracture, encouraged swimming, will message her endo re any possible value of changing to pareneteral therapy for her bone disease

## 2016-08-31 ENCOUNTER — Ambulatory Visit (HOSPITAL_COMMUNITY): Payer: Medicare Other | Attending: Orthopedic Surgery

## 2016-08-31 DIAGNOSIS — M25531 Pain in right wrist: Secondary | ICD-10-CM | POA: Diagnosis not present

## 2016-08-31 DIAGNOSIS — R29898 Other symptoms and signs involving the musculoskeletal system: Secondary | ICD-10-CM

## 2016-08-31 DIAGNOSIS — M25631 Stiffness of right wrist, not elsewhere classified: Secondary | ICD-10-CM | POA: Diagnosis not present

## 2016-08-31 LAB — POC HEMOCCULT BLD/STL (OFFICE/1-CARD/DIAGNOSTIC): FECAL OCCULT BLD: NEGATIVE

## 2016-08-31 NOTE — Therapy (Signed)
[place], [place], [address] Phone: [phone]   Fax:  [phone]  Occupational Therapy Evaluation  Patient Details  Name: Amanda Harmon MRN: [id number] Date of Birth: 12-24-1945 Referring Provider: Iran Planas, MD  Encounter Date: 08/31/2016      OT End of Session - 08/31/16 1854    Visit Number 1   Number of Visits 8   Date for OT Re-Evaluation 09/30/16   Authorization Type 1) Medicare part A 2) [employer] MCR   Authorization Time Period before 10th visit   Authorization - Visit Number 1   Authorization - Number of Visits 10   OT Start Time [id number]   OT Stop Time 1736   OT Time Calculation (min) 41 min   Activity Tolerance Patient tolerated treatment well   Behavior During Therapy [facility] for tasks assessed/performed      Past Medical History:  Diagnosis Date  . 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 Cystitis   . Hyperlipidemia    borderline   . Mitral valve prolapse 1984  . Thyroid disease 2015   hypothyroidism  . Vertigo     Past Surgical History:  Procedure Laterality Date  . BACK SURGERY  2008  . BREAST BIOPSY  1998   left - benign  . CARPECTOMY HAND Right 06/22/2016  . COLONOSCOPY N/A 01/17/2015   Procedure: COLONOSCOPY;  Surgeon: Rogene Houston, MD;  [facility];  Service: Endoscopy;  Laterality: N/A;  830 - moved to 4/21 @ 8:30  . EYE SURGERY Left 12/01/2013   cataract  . EYE SURGERY Right 12/15/2013   cataract  . SPINE SURGERY    . TONSILLECTOMY  1965  . TUBAL LIGATION  1983    There were no vitals filed for this visit.      Subjective Assessment - 08/31/16 1708    Subjective  S: I only have pain if I am writing and using it a lot and even then it's not that much.   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 Surgery was completed on 06/22/16. Pt presents today in clinic 10 weeks post op and  wearing a pre-fabricated resting hand splint. Dr. Caralyn Guile has referred patient to occupational therapy for evaluation and treatment.   Special Tests FOTO Score: 46/100   Patient Stated Goals To increase mobility and strength.   Currently in Pain? No/denies           [facility] OT Assessment - 08/31/16 1709      Assessment   Diagnosis S/P right proximal row carpectomy   Referring Provider Iran Planas, MD   Onset Date 06/22/16   Assessment 09/01/16 - follow up   Prior Therapy None     Precautions   Precaution Comments Per [facility] protocol patient is able to progress as tolerated.   Required Braces or Orthoses Other Brace/Splint   Other Brace/Splint Pre-fabricated resting hand brace     Restrictions   Weight Bearing Restrictions No     Balance Screen   Has the patient fallen in the past 6 months Yes   How many times? 1   Has the patient had a decrease in activity level because of a fear of falling?  No   Is the patient reluctant to leave their home because of a fear of falling?  No     Home  Environment   Family/patient  expects to be discharged to: Private residence   Living Arrangements Spouse/significant other     Prior Function   Level of Independence Independent   Vocation Retired   Leisure Pt enjoys spending time with grandchildren, going for walks, reading, and doing Training and development officer.     ADL   ADL comments Difficulty with strength and ROM such as lifting lightweight items at home (ie. wet towel).     Mobility   Mobility Status Independent     Written Expression   Dominant Hand Right     Vision - History   Baseline Vision No visual deficits     Cognition   Overall Cognitive Status Within Functional Limits for tasks assessed     Coordination   9 Hole Peg Test Left;Right   Right 9 Hole Peg Test 21.1"   Left 9 Hole Peg Test 23.1"     Edema   Edema None noted.     ROM / Strength   AROM / PROM / Strength AROM;PROM;Strength     Palpation   Palpation  comment No fascial restrictions noted.     AROM   Overall AROM Comments Assessed seated.   AROM Assessment Site Wrist   Right/Left Wrist Right   Right Wrist Extension 30 Degrees   Right Wrist Flexion 26 Degrees   Right Wrist Radial Deviation 32 Degrees   Right Wrist Ulnar Deviation 0 Degrees     PROM   Overall PROM Comments Assessed seated.   PROM Assessment Site Wrist   Right/Left Wrist Right   Right Wrist Extension 52 Degrees   Right Wrist Flexion 26 Degrees   Right Wrist Radial Deviation 22 Degrees   Right Wrist Ulnar Deviation 16 Degrees     Strength   Overall Strength Comments Assessed seated.   Strength Assessment Site Wrist;Hand   Right/Left Wrist Right   Right Wrist Flexion 4/5   Right Wrist Extension 4/5   Right Wrist Radial Deviation 4/5   Right Wrist Ulnar Deviation 4/5   Right/Left hand Right;Left   Right Hand Grip (lbs) 25   Right Hand Lateral Pinch 6 lbs   Right Hand 3 Point Pinch 5 lbs   Left Hand Grip (lbs) 61   Left Hand Lateral Pinch 9 lbs   Left Hand 3 Point Pinch 10 lbs                         OT Education - 08/31/16 1853    Education provided Yes   Education Details wrist flexion/extension stretch, wrist strengthening exercises, and red theraputty exercises.   Person(s) Educated Patient   Methods Demonstration;Explanation;Handout;Verbal cues   Comprehension Verbalized understanding;Returned demonstration          OT Short Term Goals - 08/31/16 1903      OT SHORT TERM GOAL #1   Title Patient will be educated and independent with HEP to increase functional use of RUE during daily tasks.   Time 4   Period Weeks   Status New     OT SHORT TERM GOAL #2   Title Patient will increase grip strength by 10# to increase ability to hold onto normal household items without dropping.    Baseline Pt is currently at 25# of grip strength which is considered 50% of the average norm.   Time 4   Period Weeks   Status New     OT SHORT  TERM GOAL #3   Title Patient will increase pinch strength by 2-3#  to increase ability to pinch and hold smaller items when needed with her right hand.   Baseline at eval: lateral pinch: 6 3 point pinch: 5   Time 4   Period Weeks   Status New     OT SHORT TERM GOAL #4   Title Patient will increase right wrist strength tp 4+/5 with decreased pain in order to return to completing normal daily tasks with more comfort and stability.   Time 4   Period Weeks   Status New     OT SHORT TERM GOAL #5   Title Patient will increase right wrist A/ROM to the following: flexion: 40 degrees, extension: 35 degrees, and ulnar deviation: 12 degrees according to the average norms and expected outcomes based on surgery in order to perform all functional tasks with less difficulty.   Baseline At eval: Wrist flexion: 26, extension: 30, ulnar deviation: 0, and radial deviation 32 (at expected outcome)   Time 4   Period Weeks   Status New                  Plan - 09-04-2016 1857    Clinical Impression Statement A: Patient is a 70 y/o female S/P right proximal row carpectomy causing ocassional pain and decreased strength and ROM resulting in difficulty completing daily tasks.   Rehab Potential Excellent   OT Frequency 2x / week   OT Duration 4 weeks   OT Treatment/Interventions Self-care/ADL training;Therapeutic exercise;Patient/family education;Splinting;Manual Therapy;Ultrasound;Therapeutic activities;DME and/or AE instruction;Parrafin;Cryotherapy;Electrical Stimulation;Moist Heat;Passive range of motion   Plan P: Pt will benefit from skilled OT services to increase functional performance during daily tasks using RUE. Treatment Plan: Pt is 10 weeks post op at this time. Per the [facility] patient is able to progress as tolerated with no precautions. Per handbook ROM tends to plataeu between 3-6 months. Grip strength is typically 50-80% of normal strength. Exercises should be completed  within patient's comfort level. Residual pain after any active or P/ROM should not occur. It is not unusual to expect loss of ROM of approximately 50% compared to pre-op wrist flexion/extension. In addition, 75% radial deviation and 30% ulnar deviation is lost. Complete passive stretching, P/ROM of wrist followed by A/ROM and general strengthening. Complete grip and pinch strengthening.   OT Home Exercise Plan 09-05-23: wrist stretches, wrist strengthening, and red theraputty exercises   Consulted and Agree with Plan of Care Patient      Patient will benefit from skilled therapeutic intervention in order to improve the following deficits and impairments:  Decreased strength, Decreased range of motion, Pain  Visit Diagnosis: Other symptoms and signs involving the musculoskeletal system  Stiffness of right wrist, not elsewhere classified  Pain in right wrist      G-Codes - 09/04/16 1915    Functional Assessment Tool Used FOTO score: 46/100 (54% impaired)   Functional Limitation Carrying, moving and handling objects   Carrying, Moving and Handling Objects Current Status [id number]) At least 40 percent but less than 60 percent impaired, limited or restricted   Carrying, Moving and Handling Objects Goal Status [id number]) At least 20 percent but less than 40 percent impaired, limited or restricted      Problem List Patient Active Problem List   Diagnosis Date Noted  . Abdominal cramping 08/30/2016  . At high risk for injury related to fall 08/30/2016  . Vitamin D deficiency 08/07/2016  . Compression fracture of first lumbar vertebra ([place]) 07/15/2016  . Fracture of right wrist 07/01/2015  .  FH: CAD (coronary artery disease) 10/31/2014  . Hypothyroidism 02/26/2014  . Backache 11/12/2010  . Osteoporosis 03/26/2010  . CHEST PAIN-PRECORDIAL 01/14/2010  . Dyslipidemia 02/16/2008   Ailene Ravel, OTR/L,CBIS  [phone]  08/31/2016, 7:17 PM  [place] [address] [place], [place], [address] Phone: [phone]   Fax:  [phone]  Name: Amanda Harmon MRN: [id number] Date of Birth: 06/23/1946

## 2016-08-31 NOTE — Patient Instructions (Signed)
Complete the follow exercises 2-3 times a day.    Wrist Flexor Stretch  Gently pull wrist into extension with elbow extended, while keeping shoulder down. Hold 10 seconds. Complete 2 times.   Wrist Extensor Stretch  Gently bring wrist into flexion on wall, extend elbow straight while keeping the shoulder down. Hold 10 seconds. Complete 2 times.    WRIST FLEXION CURLS - TABLE  Hold a small free weight, rest your forearm on a table and bend your wrist up and down with your palm face up as shown.  10-12 reps.    WRIST EXTENSION CURLS - TABLE  Hold a small free weight, rest your forearm on a table and bend your wrist up and down with your palm face down as shown.  10-12 reps    FREE WEIGHT RADIAL DEVIATION - TABLE  Hold a small free weight, rest your forearm on a table and bend your wrist up and down with your palm facing towards the side as shown. 10-12 reps    Home Exercises Program Theraputty Exercises  Do the following exercises 2 times a day using your affected hand.  1. Roll putty into a ball.  2. Make into a pancake.  3. Roll putty into a roll.  4. Pinch along log with first finger and thumb.   5. Make into a ball.  6. Roll it back into a log.   7. Pinch using thumb and side of first finger.  8. Roll into a ball, then flatten into a pancake.  9. Using your fingers, make putty into a mountain.  10. Roll the putty into a ball and squeeze 10-12 times.

## 2016-08-31 NOTE — Addendum Note (Signed)
Addended by: Eual Fines on: 08/31/2016 02:32 PM   Modules accepted: Orders

## 2016-09-01 ENCOUNTER — Encounter: Payer: Self-pay | Admitting: Family Medicine

## 2016-09-01 DIAGNOSIS — M92211 Osteochondrosis (juvenile) of carpal lunate [Kienbock], right hand: Secondary | ICD-10-CM | POA: Diagnosis not present

## 2016-09-01 DIAGNOSIS — Z4789 Encounter for other orthopedic aftercare: Secondary | ICD-10-CM | POA: Diagnosis not present

## 2016-09-04 ENCOUNTER — Encounter (HOSPITAL_COMMUNITY): Payer: Self-pay

## 2016-09-04 ENCOUNTER — Ambulatory Visit (HOSPITAL_COMMUNITY): Payer: Medicare Other

## 2016-09-04 DIAGNOSIS — M25631 Stiffness of right wrist, not elsewhere classified: Secondary | ICD-10-CM

## 2016-09-04 DIAGNOSIS — R29898 Other symptoms and signs involving the musculoskeletal system: Secondary | ICD-10-CM | POA: Diagnosis not present

## 2016-09-04 DIAGNOSIS — M25531 Pain in right wrist: Secondary | ICD-10-CM | POA: Diagnosis not present

## 2016-09-04 NOTE — Therapy (Signed)
[place], [place], [address] Phone: [phone]   Fax:  [phone]  Occupational Therapy Treatment  Patient Details  Name: Amanda Harmon MRN: [id number] Date of Birth: [birth year] Referring Provider: Iran Planas, MD  Encounter Date: 09/04/2016      OT End of Session - 09/04/16 1345    Visit Number 2   Number of Visits 8   Date for OT Re-Evaluation 09/30/16   Authorization Type 1) Medicare part A 2) [employer] M[employer]   Authorization Time Period before 10th visit   Authorization - Visit Number 2   Authorization - Number of Visits 10   OT Start Time [id number]   OT Stop Time 1340   OT Time Calculation (min) 35 min   Activity Tolerance Patient tolerated treatment well   Behavior During Therapy [facility] for tasks assessed/performed      Past Medical History:  Diagnosis Date  . 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 Cystitis   . Hyperlipidemia    borderline   . Mitral valve prolapse 1984  . Thyroid disease 2015   hypothyroidism  . Vertigo     Past Surgical History:  Procedure Laterality Date  . BACK SURGERY  2008  . BREAST BIOPSY  1998   left - benign  . CARPECTOMY HAND Right 06/22/2016  . COLONOSCOPY N/A 01/17/2015   Procedure: COLONOSCOPY;  Surgeon: Rogene Houston, MD;  [facility];  Service: Endoscopy;  Laterality: N/A;  830 - moved to 4/21 @ 8:30  . EYE SURGERY Left 12/01/2013   cataract  . EYE SURGERY Right 12/15/2013   cataract  . SPINE SURGERY    . TONSILLECTOMY  1965  . TUBAL LIGATION  1983    There were no vitals filed for this visit.      Subjective Assessment - 09/04/16 1315    Subjective  S: I saw the Doctor on Tuesday and he said just to not lift more than 5 lbs.   Currently in Pain? No/denies            [facility] OT Assessment - 09/04/16 1317      Assessment   Diagnosis S/P right proximal row carpectomy     Precautions   Precautions Other (comment)   Precaution Comments Per [facility] protocol patient is able to progress as tolerated.                  OT Treatments/Exercises (OP) - 09/04/16 1319      Exercises   Exercises Hand;Wrist     Wrist Exercises   Forearm Supination Strengthening;10 reps   Bar Weights/Barbell (Forearm Supination) 2 lbs   Forearm Pronation Strengthening;10 reps   Bar Weights/Barbell (Forearm Pronation) 2 lbs   Wrist Flexion Strengthening;10 reps   Bar Weights/Barbell (Wrist Flexion) 2 lbs   Wrist Flexion Limitations 2 second descent   Wrist Extension Strengthening;10 reps   Bar Weights/Barbell (Wrist Extension) 2 lbs   Wrist Extension Limitations 2 second descent   Wrist Radial Deviation Strengthening;10 reps   Bar Weights/Barbell (Radial Deviation) 2 lbs   Wrist Ulnar Deviation Strengthening;10 reps   Bar Weights/Barbell (Ulnar Deviation) 2 lbs   Other wrist exercises wrist extension stretch using table top; 10" hold. 2 sets     Additional Wrist Exercises   Sponges 16,24   Hand Gripper with Large Beads all beads with gripper set at 20#   Hand Gripper with Medium Beads all  beads with gripper set at 20#   Hand Gripper with Small Beads all beads with gripper set at 20#     Hand Exercises   Other Hand Exercises Pt utilized green clothespin to pick up 30 sponges 1 at a time with a 3 point pinch.                 OT Education - 09/04/16 1345    Education provided Yes   Education Details pt was given OT eval print out for goal and plan of care reference.    Person(s) Educated Patient   Methods Explanation;Handout   Comprehension Verbalized understanding          OT Short Term Goals - 09/04/16 1321      OT SHORT TERM GOAL #1   Title Patient will be educated and independent with HEP to increase functional use of RUE during daily tasks.   Time 4   Period Weeks   Status On-going     OT SHORT TERM GOAL #2   Title Patient will increase grip strength by 10# to increase  ability to hold onto normal household items without dropping.    Baseline Pt is currently at 25# of grip strength which is considered 50% of the average norm.   Time 4   Period Weeks   Status On-going     OT SHORT TERM GOAL #3   Title Patient will increase pinch strength by 2-3# to increase ability to pinch and hold smaller items when needed with her right hand.   Baseline at eval: lateral pinch: 6 3 point pinch: 5   Time 4   Period Weeks   Status On-going     OT SHORT TERM GOAL #4   Title Patient will increase right wrist strength tp 4+/5 with decreased pain in order to return to completing normal daily tasks with more comfort and stability.   Time 4   Period Weeks   Status On-going     OT SHORT TERM GOAL #5   Title Patient will increase right wrist A/ROM to the following: flexion: 40 degrees, extension: 35 degrees, and ulnar deviation: 12 degrees according to the average norms and expected outcomes based on surgery in order to perform all functional tasks with less difficulty.   Baseline At eval: Wrist flexion: 26, extension: 30, ulnar deviation: 0, and radial deviation 32 (at expected outcome)   Time 4   Period Weeks   Status On-going                  Plan - 09/04/16 1345    Clinical Impression Statement A: initiated grip and pinch strengthening as well as wrist strengthening with 2#. pt did well with limited ROM in wrist. VC for form and technique. patient reports that the Doctor was pleased with her measurements from the evaluation.   Plan P: Continue with wrist strengthening increasing repetitions as able to tolerate.       Patient will benefit from skilled therapeutic intervention in order to improve the following deficits and impairments:  Decreased strength, Decreased range of motion, Pain  Visit Diagnosis: Other symptoms and signs involving the musculoskeletal system  Stiffness of right wrist, not elsewhere classified    Problem List Patient Active  Problem List   Diagnosis Date Noted  . Abdominal cramping 08/30/2016  . At high risk for injury related to fall 08/30/2016  . Vitamin D deficiency 08/07/2016  . Compression fracture of first lumbar vertebra ([place]) 07/15/2016  .  Fracture of right wrist 07/01/2015  . FH: CAD (coronary artery disease) 10/31/2014  . Hypothyroidism 02/26/2014  . Backache 11/12/2010  . Osteoporosis 03/26/2010  . CHEST PAIN-PRECORDIAL 01/14/2010  . Dyslipidemia 02/16/2008   Ailene Ravel, OTR/L,CBIS  [phone]  09/04/2016, 1:48 PM  [facility] [address] [place], [place], [address] Phone: [phone]   Fax:  [phone]  Name: Amanda Harmon MRN: [id number] Date of Birth: 04-Jan-1946

## 2016-09-08 ENCOUNTER — Encounter (HOSPITAL_COMMUNITY): Payer: Medicare Other | Admitting: Occupational Therapy

## 2016-09-09 ENCOUNTER — Encounter (HOSPITAL_COMMUNITY): Payer: Self-pay

## 2016-09-09 ENCOUNTER — Ambulatory Visit (HOSPITAL_COMMUNITY): Payer: Medicare Other

## 2016-09-09 ENCOUNTER — Telehealth (HOSPITAL_COMMUNITY): Payer: Self-pay | Admitting: Specialist

## 2016-09-09 DIAGNOSIS — M25631 Stiffness of right wrist, not elsewhere classified: Secondary | ICD-10-CM

## 2016-09-09 DIAGNOSIS — M25531 Pain in right wrist: Secondary | ICD-10-CM | POA: Diagnosis not present

## 2016-09-09 DIAGNOSIS — R29898 Other symptoms and signs involving the musculoskeletal system: Secondary | ICD-10-CM

## 2016-09-09 NOTE — Telephone Encounter (Signed)
Called pt she agreed to cx this apptment 09/23/16 due to [facility] having a training class that day. Nf 09/09/16

## 2016-09-09 NOTE — Therapy (Signed)
[place], [place], [address] Phone: [phone]   Fax:  [phone]  Occupational Therapy Treatment  Patient Details  Name: Amanda Harmon MRN: [id number] Date of Birth: November 22, 1945 Referring Provider: Iran Planas, MD  Encounter Date: 09/09/2016      OT End of Session - 09/09/16 1458    Visit Number 3   Number of Visits 8   Date for OT Re-Evaluation 09/30/16   Authorization Type 1) Medicare part A 2) [employer] M[employer]   Authorization Time Period before 10th visit   Authorization - Visit Number 3   Authorization - Number of Visits 10   OT Start Time [id number]   OT Stop Time 1430   OT Time Calculation (min) 45 min   Activity Tolerance Patient tolerated treatment well   Behavior During Therapy [facility] for tasks assessed/performed      Past Medical History:  Diagnosis Date  . 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 Cystitis   . Hyperlipidemia    borderline   . Mitral valve prolapse 1984  . Thyroid disease 2015   hypothyroidism  . Vertigo     Past Surgical History:  Procedure Laterality Date  . BACK SURGERY  2008  . BREAST BIOPSY  1998   left - benign  . CARPECTOMY HAND Right 06/22/2016  . COLONOSCOPY N/A 01/17/2015   Procedure: COLONOSCOPY;  Surgeon: Rogene Houston, MD;  [facility];  Service: Endoscopy;  Laterality: N/A;  830 - moved to 4/21 @ 8:30  . EYE SURGERY Left 12/01/2013   cataract  . EYE SURGERY Right 12/15/2013   cataract  . SPINE SURGERY    . TONSILLECTOMY  1965  . TUBAL LIGATION  1983    There were no vitals filed for this visit.      Subjective Assessment - 09/09/16 1355    Subjective  S: I've been working on my exercises at home.    Currently in Pain? No/denies            [facility] OT Assessment - 09/09/16 1355      Assessment   Diagnosis S/P right proximal row carpectomy     Precautions   Precautions Other (comment)   Precaution Comments Per [facility] protocol patient is able to progress as tolerated.                  OT Treatments/Exercises (OP) - 09/09/16 1355      Exercises   Exercises Hand;Wrist     Wrist Exercises   Forearm Supination Strengthening  12X   Bar Weights/Barbell (Forearm Supination) 2 lbs   Forearm Pronation Strengthening  12X   Bar Weights/Barbell (Forearm Pronation) 2 lbs   Wrist Flexion Strengthening  12X   Bar Weights/Barbell (Wrist Flexion) 2 lbs   Wrist Flexion Limitations slow descent   Wrist Extension Strengthening  12X   Bar Weights/Barbell (Wrist Extension) 2 lbs   Wrist Extension Limitations slow descent   Wrist Radial Deviation Strengthening  12X   Bar Weights/Barbell (Radial Deviation) 2 lbs   Wrist Ulnar Deviation Strengthening  12X   Bar Weights/Barbell (Ulnar Deviation) 2 lbs   Other wrist exercises P/ROM flexion, extendion, ulnar and radial deviation with right wrist; 10X   Other wrist exercises Utilized pvc pipe to cut out circles in red theraputty.      Additional Wrist Exercises   Sponges 29,23   Theraputty Flatten;Roll;Grip;Pinch   Theraputty -  Flatten red   Theraputty - Roll red   Theraputty - Grip red   Theraputty - Pinch red   Hand Gripper with Large Beads all beads with gripper set at 20#   Hand Gripper with Medium Beads all beads with gripper set at 20#   Hand Gripper with Small Beads all beads with gripper set at 20#                  OT Short Term Goals - 09/04/16 1321      OT SHORT TERM GOAL #1   Title Patient will be educated and independent with HEP to increase functional use of RUE during daily tasks.   Time 4   Period Weeks   Status On-going     OT SHORT TERM GOAL #2   Title Patient will increase grip strength by 10# to increase ability to hold onto normal household items without dropping.    Baseline Pt is currently at 25# of grip strength which is considered 50% of the average norm.   Time 4   Period Weeks   Status  On-going     OT SHORT TERM GOAL #3   Title Patient will increase pinch strength by 2-3# to increase ability to pinch and hold smaller items when needed with her right hand.   Baseline at eval: lateral pinch: 6 3 point pinch: 5   Time 4   Period Weeks   Status On-going     OT SHORT TERM GOAL #4   Title Patient will increase right wrist strength tp 4+/5 with decreased pain in order to return to completing normal daily tasks with more comfort and stability.   Time 4   Period Weeks   Status On-going     OT SHORT TERM GOAL #5   Title Patient will increase right wrist A/ROM to the following: flexion: 40 degrees, extension: 35 degrees, and ulnar deviation: 12 degrees according to the average norms and expected outcomes based on surgery in order to perform all functional tasks with less difficulty.   Baseline At eval: Wrist flexion: 26, extension: 30, ulnar deviation: 0, and radial deviation 32 (at expected outcome)   Time 4   Period Weeks   Status On-going                  Plan - 09/09/16 1506    Clinical Impression Statement A: Pt was able to tolerate completing strengthening repetitions to 12 this session. Reports some pain at times although it's not consistant. VC for form and technique.   Plan P: Continue with strength exercises.      Patient will benefit from skilled therapeutic intervention in order to improve the following deficits and impairments:  Decreased strength, Decreased range of motion, Pain  Visit Diagnosis: Other symptoms and signs involving the musculoskeletal system  Stiffness of right wrist, not elsewhere classified    Problem List Patient Active Problem List   Diagnosis Date Noted  . Abdominal cramping 08/30/2016  . At high risk for injury related to fall 08/30/2016  . Vitamin D deficiency 08/07/2016  . Compression fracture of first lumbar vertebra ([place]) 07/15/2016  . Fracture of right wrist 07/01/2015  . FH: CAD (coronary artery disease)  10/31/2014  . Hypothyroidism 02/26/2014  . Backache 11/12/2010  . Osteoporosis 03/26/2010  . CHEST PAIN-PRECORDIAL 01/14/2010  . Dyslipidemia 02/16/2008   Ailene Ravel, OTR/L,CBIS  [phone]  09/09/2016, 3:08 PM  [place],  [place], [address] Phone: [phone]   Fax:  [phone]  Name: Amanda Harmon MRN: [id number] Date of Birth: [birth year]

## 2016-09-11 ENCOUNTER — Encounter (HOSPITAL_COMMUNITY): Payer: Self-pay

## 2016-09-11 ENCOUNTER — Ambulatory Visit (HOSPITAL_COMMUNITY): Payer: Medicare Other

## 2016-09-11 DIAGNOSIS — M25531 Pain in right wrist: Secondary | ICD-10-CM

## 2016-09-11 DIAGNOSIS — R29898 Other symptoms and signs involving the musculoskeletal system: Secondary | ICD-10-CM

## 2016-09-11 DIAGNOSIS — M25631 Stiffness of right wrist, not elsewhere classified: Secondary | ICD-10-CM | POA: Diagnosis not present

## 2016-09-11 NOTE — Therapy (Signed)
[place], [place], [address] Phone: [phone]   Fax:  [phone]  Occupational Therapy Treatment  Patient Details  Name: Amanda Harmon MRN: [id number] Date of Birth: 1946/07/13 Referring Provider: Iran Planas, MD  Encounter Date: 09/11/2016      OT End of Session - 09/11/16 1329    Visit Number 4   Number of Visits 8   Date for OT Re-Evaluation 09/30/16   Authorization Type 1) Medicare part A 2) [employer] M[employer]   Authorization Time Period before 10th visit   Authorization - Visit Number 4   Authorization - Number of Visits 10   OT Start Time [id number]   OT Stop Time 1345   OT Time Calculation (min) 40 min   Activity Tolerance Patient tolerated treatment well   Behavior During Therapy [facility] for tasks assessed/performed      Past Medical History:  Diagnosis Date  . 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 Cystitis   . Hyperlipidemia    borderline   . Mitral valve prolapse 1984  . Thyroid disease 2015   hypothyroidism  . Vertigo     Past Surgical History:  Procedure Laterality Date  . BACK SURGERY  2008  . BREAST BIOPSY  1998   left - benign  . CARPECTOMY HAND Right 06/22/2016  . COLONOSCOPY N/A 01/17/2015   Procedure: COLONOSCOPY;  Surgeon: Rogene Houston, MD;  [facility];  Service: Endoscopy;  Laterality: N/A;  830 - moved to 4/21 @ 8:30  . EYE SURGERY Left 12/01/2013   cataract  . EYE SURGERY Right 12/15/2013   cataract  . SPINE SURGERY    . TONSILLECTOMY  1965  . TUBAL LIGATION  1983    There were no vitals filed for this visit.      Subjective Assessment - 09/11/16 1308    Subjective  S: The weights are probably the hardest thing for me to do.   Currently in Pain? No/denies            [facility] OT Assessment - 09/11/16 1309      Assessment   Diagnosis S/P right proximal row carpectomy     Precautions   Precautions Other (comment)   Precaution Comments  Per [facility] protocol patient is able to progress as tolerated.                  OT Treatments/Exercises (OP) - 09/11/16 1310      Exercises   Exercises Hand;Wrist     Wrist Exercises   Forearm Supination Strengthening  12X   Bar Weights/Barbell (Forearm Supination) 2 lbs   Forearm Pronation Strengthening  12X   Bar Weights/Barbell (Forearm Pronation) 2 lbs   Wrist Flexion Strengthening  12X   Bar Weights/Barbell (Wrist Flexion) 2 lbs   Wrist Flexion Limitations slow descent   Wrist Extension Strengthening  12X   Bar Weights/Barbell (Wrist Extension) 2 lbs   Wrist Extension Limitations slow dscent   Wrist Radial Deviation Strengthening  12X   Bar Weights/Barbell (Radial Deviation) 2 lbs   Wrist Ulnar Deviation Strengthening  12X   Bar Weights/Barbell (Ulnar Deviation) 2 lbs   Other wrist exercises wrist extension/flexion stretch 10 second hold; 3 sets     Fine Motor Coordination   Small Pegboard Patient completed colored pegboard with tweezers to focus on pinch strength versus coordination. Slightly increased time needed to complete. Min difficulty.  Additional Wrist Exercises   Hand Gripper with Large Beads all beads with gripper set at 20#   Hand Gripper with Medium Beads all beads with gripper set at 20#   Hand Gripper with Small Beads all beads with gripper set at 20#     Hand Exercises   Other Hand Exercises Pt utilized green clothespin to pick up 30 sponges 1 at a time with a 3 point pinch.                   OT Short Term Goals - 09/04/16 1321      OT SHORT TERM GOAL #1   Title Patient will be educated and independent with HEP to increase functional use of RUE during daily tasks.   Time 4   Period Weeks   Status On-going     OT SHORT TERM GOAL #2   Title Patient will increase grip strength by 10# to increase ability to hold onto normal household items without dropping.    Baseline Pt is currently at 25# of grip strength  which is considered 50% of the average norm.   Time 4   Period Weeks   Status On-going     OT SHORT TERM GOAL #3   Title Patient will increase pinch strength by 2-3# to increase ability to pinch and hold smaller items when needed with her right hand.   Baseline at eval: lateral pinch: 6 3 point pinch: 5   Time 4   Period Weeks   Status On-going     OT SHORT TERM GOAL #4   Title Patient will increase right wrist strength tp 4+/5 with decreased pain in order to return to completing normal daily tasks with more comfort and stability.   Time 4   Period Weeks   Status On-going     OT SHORT TERM GOAL #5   Title Patient will increase right wrist A/ROM to the following: flexion: 40 degrees, extension: 35 degrees, and ulnar deviation: 12 degrees according to the average norms and expected outcomes based on surgery in order to perform all functional tasks with less difficulty.   Baseline At eval: Wrist flexion: 26, extension: 30, ulnar deviation: 0, and radial deviation 32 (at expected outcome)   Time 4   Period Weeks   Status On-going                  Plan - 09/11/16 1406    Clinical Impression Statement A: Pt doing well with all strengthening exercises as is expected with limitations that are expected. Slight discomfort with handgripper exercise.    Plan P: Continue with strengthening exercises. Increase handgripper to 22# if able to tolerate.      Patient will benefit from skilled therapeutic intervention in order to improve the following deficits and impairments:  Decreased strength, Decreased range of motion, Pain  Visit Diagnosis: Other symptoms and signs involving the musculoskeletal system  Stiffness of right wrist, not elsewhere classified  Pain in right wrist    Problem List Patient Active Problem List   Diagnosis Date Noted  . Abdominal cramping 08/30/2016  . At high risk for injury related to fall 08/30/2016  . Vitamin D deficiency 08/07/2016  .  Compression fracture of first lumbar vertebra ([place]) 07/15/2016  . Fracture of right wrist 07/01/2015  . FH: CAD (coronary artery disease) 10/31/2014  . Hypothyroidism 02/26/2014  . Backache 11/12/2010  . Osteoporosis 03/26/2010  . CHEST PAIN-PRECORDIAL 01/14/2010  . Dyslipidemia 02/16/2008  Ailene Ravel, OTR/L,CBIS  [phone]   09/11/2016, 2:08 PM  [facility] [address] [place], [place], [address] Phone: [phone]   Fax:  [phone]  Name: PRANATI CLERKIN MRN: [id number] Date of Birth: 15-Nov-1945

## 2016-09-14 ENCOUNTER — Ambulatory Visit (HOSPITAL_COMMUNITY): Payer: Medicare Other | Admitting: Specialist

## 2016-09-14 DIAGNOSIS — M25531 Pain in right wrist: Secondary | ICD-10-CM

## 2016-09-14 DIAGNOSIS — M25631 Stiffness of right wrist, not elsewhere classified: Secondary | ICD-10-CM

## 2016-09-14 DIAGNOSIS — R29898 Other symptoms and signs involving the musculoskeletal system: Secondary | ICD-10-CM

## 2016-09-14 NOTE — Therapy (Signed)
[place], [place], [address] Phone: [phone]   Fax:  [phone]  Occupational Therapy Treatment  Patient Details  Name: Amanda Harmon MRN: [id number] Date of Birth: 1946-05-19 Referring Provider: Iran Planas, MD  Encounter Date: 09/14/2016      OT End of Session - 09/14/16 1551    Visit Number 5   Number of Visits 8   Date for OT Re-Evaluation 09/30/16   Authorization Type 1) Medicare part A 2) [employer] MCR   Authorization Time Period before 10th visit   Authorization - Visit Number 5   Authorization - Number of Visits 10   OT Start Time 1300   OT Stop Time 1345   OT Time Calculation (min) 45 min   Activity Tolerance Patient tolerated treatment well   Behavior During Therapy [facility] for tasks assessed/performed      Past Medical History:  Diagnosis Date  . 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 Cystitis   . Hyperlipidemia    borderline   . Mitral valve prolapse 1984  . Thyroid disease 2015   hypothyroidism  . Vertigo     Past Surgical History:  Procedure Laterality Date  . BACK SURGERY  2008  . BREAST BIOPSY  1998   left - benign  . CARPECTOMY HAND Right 06/22/2016  . COLONOSCOPY N/A 01/17/2015   Procedure: COLONOSCOPY;  Surgeon: Rogene Houston, MD;  [facility];  Service: Endoscopy;  Laterality: N/A;  830 - moved to 4/21 @ 8:30  . EYE SURGERY Left 12/01/2013   cataract  . EYE SURGERY Right 12/15/2013   cataract  . SPINE SURGERY    . TONSILLECTOMY  1965  . TUBAL LIGATION  1983    There were no vitals filed for this visit.      Subjective Assessment - 09/14/16 1551    Subjective  S:  Its strange that my wrist wont move all the way when I want it to.   Currently in Pain? No/denies            [facility] OT Assessment - 09/14/16 0001      Assessment   Diagnosis S/P right proximal row carpectomy     Precautions   Precautions Other (comment)   Precaution  Comments Per [facility] protocol patient is able to progress as tolerated.                  OT Treatments/Exercises (OP) - 09/14/16 0001      Exercises   Exercises Hand;Wrist     Wrist Exercises   Forearm Supination Strengthening;15 reps   Bar Weights/Barbell (Forearm Supination) 2 lbs   Forearm Pronation Strengthening;15 reps   Bar Weights/Barbell (Forearm Pronation) 2 lbs   Wrist Flexion Strengthening;15 reps   Bar Weights/Barbell (Wrist Flexion) 2 lbs   Wrist Extension Strengthening;15 reps   Bar Weights/Barbell (Wrist Extension) 2 lbs   Wrist Radial Deviation Strengthening;15 reps   Bar Weights/Barbell (Radial Deviation) 2 lbs   Wrist Ulnar Deviation Strengthening;15 reps   Bar Weights/Barbell (Ulnar Deviation) 2 lbs     Additional Wrist Exercises   Theraputty Flatten;Roll;Grip;Locate Pegs   Theraputty - Flatten green in standing   Theraputty - Roll green   Theraputty - Grip green supinated and pronated grip   Theraputty - Locate Pegs 10 beads    Hand Gripper with Large Beads all beads with gripper seat at 22#   Hand Gripper with  Medium Beads all beads with gripper set at 22#   Hand Gripper with Small Beads all beads with gripper set at 22#  mod difficulty to complete     Manual Therapy   Manual Therapy Myofascial release   Manual therapy comments manual therapy completed seperately from all other interventions this date.     Myofascial Release myofascial release and scar release to surgical scar and assoicated regions to decrease stiffness, P/ROM and hold while strethcing into flexion and extension                    OT Short Term Goals - 09/04/16 1321      OT SHORT TERM GOAL #1   Title Patient will be educated and independent with HEP to increase functional use of RUE during daily tasks.   Time 4   Period Weeks   Status On-going     OT SHORT TERM GOAL #2   Title Patient will increase grip strength by 10# to increase ability to hold  onto normal household items without dropping.    Baseline Pt is currently at 25# of grip strength which is considered 50% of the average norm.   Time 4   Period Weeks   Status On-going     OT SHORT TERM GOAL #3   Title Patient will increase pinch strength by 2-3# to increase ability to pinch and hold smaller items when needed with her right hand.   Baseline at eval: lateral pinch: 6 3 point pinch: 5   Time 4   Period Weeks   Status On-going     OT SHORT TERM GOAL #4   Title Patient will increase right wrist strength tp 4+/5 with decreased pain in order to return to completing normal daily tasks with more comfort and stability.   Time 4   Period Weeks   Status On-going     OT SHORT TERM GOAL #5   Title Patient will increase right wrist A/ROM to the following: flexion: 40 degrees, extension: 35 degrees, and ulnar deviation: 12 degrees according to the average norms and expected outcomes based on surgery in order to perform all functional tasks with less difficulty.   Baseline At eval: Wrist flexion: 26, extension: 30, ulnar deviation: 0, and radial deviation 32 (at expected outcome)   Time 4   Period Weeks   Status On-going                  Plan - 09/14/16 1552    Clinical Impression Statement A:  Added myofacial release manual therapy this date to improve available range.  increased to 22# resistance with hand gripper activity with mod difficulty with small beads only.   Plan P:  complete hand gripper with 22# with minimal difficulty with small beads.  follow up on difficult functional activities at home and incorporate these into treatment session.    Consulted and Agree with Plan of Care Patient      Patient will benefit from skilled therapeutic intervention in order to improve the following deficits and impairments:  Decreased strength, Decreased range of motion, Pain  Visit Diagnosis: Other symptoms and signs involving the musculoskeletal system  Stiffness of  right wrist, not elsewhere classified  Pain in right wrist    Problem List Patient Active Problem List   Diagnosis Date Noted  . Abdominal cramping 08/30/2016  . At high risk for injury related to fall 08/30/2016  . Vitamin D deficiency 08/07/2016  . Compression fracture of  first lumbar vertebra ([place]) 07/15/2016  . Fracture of right wrist 07/01/2015  . FH: CAD (coronary artery disease) 10/31/2014  . Hypothyroidism 02/26/2014  . Backache 11/12/2010  . Osteoporosis 03/26/2010  . CHEST PAIN-PRECORDIAL 01/14/2010  . Dyslipidemia 02/16/2008    Vangie Bicker, [place], OTR/L [phone]  09/14/2016, 3:55 PM  [facility] [address] [place], [place], [address] Phone: [phone]   Fax:  [phone]  Name: Amanda Harmon MRN: [id number] Date of Birth: 02/04/1946

## 2016-09-15 ENCOUNTER — Encounter (HOSPITAL_COMMUNITY): Payer: Self-pay | Admitting: Interventional Radiology

## 2016-09-16 ENCOUNTER — Ambulatory Visit (HOSPITAL_COMMUNITY): Payer: Medicare Other

## 2016-09-16 ENCOUNTER — Encounter (HOSPITAL_COMMUNITY): Payer: Self-pay

## 2016-09-16 DIAGNOSIS — M25531 Pain in right wrist: Secondary | ICD-10-CM

## 2016-09-16 DIAGNOSIS — R29898 Other symptoms and signs involving the musculoskeletal system: Secondary | ICD-10-CM | POA: Diagnosis not present

## 2016-09-16 DIAGNOSIS — M25631 Stiffness of right wrist, not elsewhere classified: Secondary | ICD-10-CM

## 2016-09-16 NOTE — Therapy (Signed)
[place], [place], [address] Phone: [phone]   Fax:  [phone]  Occupational Therapy Treatment  Patient Details  Name: Amanda Harmon MRN: [id number] Date of Birth: [birth year] Referring Provider: Iran Planas, MD  Encounter Date: 09/16/2016      OT End of Session - 09/16/16 1312    Visit Number 6   Number of Visits 8   Date for OT Re-Evaluation 09/30/16   Authorization Type 1) Medicare part A 2) [employer] MCR   Authorization Time Period before 10th visit   Authorization - Visit Number 6   Authorization - Number of Visits 10   OT Start Time 1302   OT Stop Time 1345   OT Time Calculation (min) 43 min   Activity Tolerance Patient tolerated treatment well   Behavior During Therapy [facility] for tasks assessed/performed      Past Medical History:  Diagnosis Date  . 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 Cystitis   . Hyperlipidemia    borderline   . Mitral valve prolapse 1984  . Thyroid disease 2015   hypothyroidism  . Vertigo     Past Surgical History:  Procedure Laterality Date  . BACK SURGERY  2008  . BREAST BIOPSY  1998   left - benign  . CARPECTOMY HAND Right 06/22/2016  . COLONOSCOPY N/A 01/17/2015   Procedure: COLONOSCOPY;  Surgeon: Rogene Houston, MD;  [facility];  Service: Endoscopy;  Laterality: N/A;  830 - moved to 4/21 @ 8:30  . EYE SURGERY Left 12/01/2013   cataract  . EYE SURGERY Right 12/15/2013   cataract  . IR GENERIC HISTORICAL  08/19/2016   IR RADIOLOGIST EVAL & MGMT 08/19/2016 [facility]-INTER[facility] RAD  . SPINE SURGERY    . TONSILLECTOMY  1965  . TUBAL LIGATION  1983    There were no vitals filed for this visit.      Subjective Assessment - 09/16/16 1307    Subjective  S: I'm doing everything I need to do with my wrist. There was one thing I was having trouble with and I made a mental note of it but I forgot what it was.    Currently in Pain? Yes   Pain  Score 3    Pain Location Wrist   Pain Orientation Right   Pain Descriptors / Indicators Sore   Pain Type Acute pain   Pain Radiating Towards n/a   Pain Onset Yesterday   Pain Frequency Occasional   Aggravating Factors  use and gripping   Pain Relieving Factors rest   Effect of Pain on Daily Activities min effect   Multiple Pain Sites No            [facility] OT Assessment - 09/16/16 1305      Assessment   Diagnosis S/P right proximal row carpectomy     Precautions   Precautions Other (comment)   Precaution Comments Per [facility] protocol patient is able to progress as tolerated.                  OT Treatments/Exercises (OP) - 09/16/16 1305      Exercises   Exercises Hand;Wrist     Wrist Exercises   Forearm Supination Strengthening;15 reps   Bar Weights/Barbell (Forearm Supination) 2 lbs   Forearm Pronation Strengthening;15 reps   Bar Weights/Barbell (Forearm Pronation) 2 lbs   Wrist Flexion Strengthening;15 reps   Bar Weights/Barbell (Wrist Flexion)  2 lbs   Wrist Extension Strengthening;15 reps   Bar Weights/Barbell (Wrist Extension) 2 lbs   Wrist Radial Deviation Strengthening;15 reps   Bar Weights/Barbell (Radial Deviation) 2 lbs   Wrist Ulnar Deviation Strengthening;15 reps   Bar Weights/Barbell (Ulnar Deviation) 2 lbs     Additional Wrist Exercises   Theraputty Flatten;Roll;Grip   Theraputty - Flatten red   Theraputty - Roll red   Theraputty - Grip red supinated and pronated grip   Theraputty - Pinch red   Hand Gripper with Large Beads all beads with gripper seat at 22#   Hand Gripper with Medium Beads all beads with gripper set at 22#   Hand Gripper with Small Beads all beads with gripper set at 22#       Hand Exercises   Other Hand Exercises Pt utilized green clothespin to pick up 30 sponges 1 at a time with a 3 point pinch.      Fine Motor Coordination   Fine Motor Coordination Grooved pegs   Grooved pegs Utilizing tweezers, patient  placed pegs in pegboard. mod-max difficulty.                  OT Short Term Goals - 09/04/16 1321      OT SHORT TERM GOAL #1   Title Patient will be educated and independent with HEP to increase functional use of RUE during daily tasks.   Time 4   Period Weeks   Status On-going     OT SHORT TERM GOAL #2   Title Patient will increase grip strength by 10# to increase ability to hold onto normal household items without dropping.    Baseline Pt is currently at 25# of grip strength which is considered 50% of the average norm.   Time 4   Period Weeks   Status On-going     OT SHORT TERM GOAL #3   Title Patient will increase pinch strength by 2-3# to increase ability to pinch and hold smaller items when needed with her right hand.   Baseline at eval: lateral pinch: 6 3 point pinch: 5   Time 4   Period Weeks   Status On-going     OT SHORT TERM GOAL #4   Title Patient will increase right wrist strength tp 4+/5 with decreased pain in order to return to completing normal daily tasks with more comfort and stability.   Time 4   Period Weeks   Status On-going     OT SHORT TERM GOAL #5   Title Patient will increase right wrist A/ROM to the following: flexion: 40 degrees, extension: 35 degrees, and ulnar deviation: 12 degrees according to the average norms and expected outcomes based on surgery in order to perform all functional tasks with less difficulty.   Baseline At eval: Wrist flexion: 26, extension: 30, ulnar deviation: 0, and radial deviation 32 (at expected outcome)   Time 4   Period Weeks   Status On-going                  Plan - 09/16/16 1347    Clinical Impression Statement A: Continued with gripper and pinch activities. Patient reports that green theraputty was very difficult last time and did make her sore.    Plan P: Reassess      Patient will benefit from skilled therapeutic intervention in order to improve the following deficits and impairments:   Decreased strength, Decreased range of motion, Pain  Visit Diagnosis: Other symptoms and signs involving  the musculoskeletal system  Stiffness of right wrist, not elsewhere classified  Pain in right wrist    Problem List Patient Active Problem List   Diagnosis Date Noted  . Abdominal cramping 08/30/2016  . At high risk for injury related to fall 08/30/2016  . Vitamin D deficiency 08/07/2016  . Compression fracture of first lumbar vertebra ([place]) 07/15/2016  . Fracture of right wrist 07/01/2015  . FH: CAD (coronary artery disease) 10/31/2014  . Hypothyroidism 02/26/2014  . Backache 11/12/2010  . Osteoporosis 03/26/2010  . CHEST PAIN-PRECORDIAL 01/14/2010  . Dyslipidemia 02/16/2008   Ailene Ravel, OTR/L,CBIS  [phone]  09/16/2016, 1:50 PM  [place] [address] [place], [place], [address] Phone: [phone]   Fax:  [phone]  Name: Amanda Harmon MRN: [id number] Date of Birth: 06/15/1946

## 2016-09-22 ENCOUNTER — Other Ambulatory Visit: Payer: Self-pay | Admitting: Family Medicine

## 2016-09-22 DIAGNOSIS — M8580 Other specified disorders of bone density and structure, unspecified site: Secondary | ICD-10-CM

## 2016-09-23 ENCOUNTER — Ambulatory Visit (HOSPITAL_COMMUNITY): Payer: Medicare Other | Admitting: Specialist

## 2016-09-25 ENCOUNTER — Ambulatory Visit (HOSPITAL_COMMUNITY): Payer: Medicare Other | Admitting: Occupational Therapy

## 2016-09-25 DIAGNOSIS — M25631 Stiffness of right wrist, not elsewhere classified: Secondary | ICD-10-CM

## 2016-09-25 DIAGNOSIS — M25531 Pain in right wrist: Secondary | ICD-10-CM

## 2016-09-25 DIAGNOSIS — R29898 Other symptoms and signs involving the musculoskeletal system: Secondary | ICD-10-CM

## 2016-09-25 NOTE — Therapy (Signed)
[place], [place], [address] Phone: [phone]   Fax:  [phone]  Occupational Therapy Reassessment, Treatment, and Discharge  Patient Details  Name: Amanda Harmon MRN: [id number] Date of Birth: [birth year] Referring Provider: Iran Planas, MD  Encounter Date: 09/25/2016      OT End of Session - 09/25/16 1212    Visit Number 7   Number of Visits 8   Date for OT Re-Evaluation 09/30/16   Authorization Type 1) Medicare part A 2) [employer] MCR   Authorization Time Period before 10th visit   Authorization - Visit Number 7   Authorization - Number of Visits 10   OT Start Time 1120   OT Stop Time 1200   OT Time Calculation (min) 40 min   Activity Tolerance Patient tolerated treatment well   Behavior During Therapy [facility] for tasks assessed/performed      Past Medical History:  Diagnosis Date  . 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 Cystitis   . Hyperlipidemia    borderline   . Mitral valve prolapse 1984  . Thyroid disease 2015   hypothyroidism  . Vertigo     Past Surgical History:  Procedure Laterality Date  . BACK SURGERY  2008  . BREAST BIOPSY  1998   left - benign  . CARPECTOMY HAND Right 06/22/2016  . COLONOSCOPY N/A 01/17/2015   Procedure: COLONOSCOPY;  Surgeon: Rogene Houston, MD;  [facility];  Service: Endoscopy;  Laterality: N/A;  830 - moved to 4/21 @ 8:30  . EYE SURGERY Left 12/01/2013   cataract  . EYE SURGERY Right 12/15/2013   cataract  . IR GENERIC HISTORICAL  08/19/2016   IR RADIOLOGIST EVAL & MGMT 08/19/2016 [facility]INTER[facility]  . SPINE SURGERY    . TONSILLECTOMY  1965  . TUBAL LIGATION  1983    There were no vitals filed for this visit.      Subjective Assessment - 09/25/16 1123    Subjective  S: I think I overdid it the other day and I'm sore now.    Currently in Pain? No/denies           [facility] OT Assessment - 09/25/16 1123      Assessment   Diagnosis S/P right proximal row carpectomy     Precautions   Precautions Other (comment)   Precaution Comments Per [facility] protocol patient is able to progress as tolerated.     AROM   Right/Left Wrist Right   Right Wrist Extension 40 Degrees  30 previous    Right Wrist Flexion 36 Degrees  26 previous   Right Wrist Radial Deviation 34 Degrees  32 previous   Right Wrist Ulnar Deviation 18 Degrees  0 previous     PROM   Overall PROM Comments Assessed seated.   Right/Left Wrist Right   Right Wrist Extension 58 Degrees  52 previous   Right Wrist Flexion 48 Degrees  26 previous   Right Wrist Radial Deviation 46 Degrees  22 previous   Right Wrist Ulnar Deviation 25 Degrees  16 previous     Strength   Overall Strength Comments Assessed seated.   Right/Left Wrist Right   Right Wrist Flexion 4+/5  4/5 previous   Right Wrist Extension 4+/5  4/5 previous   Right Wrist Radial Deviation 5/5  4/5 previous   Right Wrist Ulnar Deviation 5/5  4/5 previous   Right Hand Grip (lbs) 30  25 previous   Right Hand Lateral Pinch 10 lbs  6 previous   Right Hand 3 Point Pinch 8 lbs  5 previous                  OT Treatments/Exercises (OP) - 09/25/16 1123      Exercises   Exercises Hand;Wrist     Wrist Exercises   Other wrist exercises wrist flexion stretch-3x, 10 seconds each     Additional Wrist Exercises   Theraputty Roll;Grip;Pinch   Theraputty - Roll green   Theraputty - Grip green    Theraputty - Pinch green   Hand Gripper with Large Beads all beads with gripper seat at 25#   Hand Gripper with Medium Beads all beads with gripper set at 25#   Hand Gripper with Small Beads all beads with gripper set at 25#                 OT Education - 09/25/16 1144    Education provided Yes   Education Details Pt provided with wrist flexion stretch; updated to green theraputty for grip strenghtening   Person(s) Educated Patient   Methods  Explanation;Demonstration;Handout   Comprehension Verbalized understanding;Returned demonstration          OT Short Term Goals - 09/25/16 1214      OT SHORT TERM GOAL #1   Title Patient will be educated and independent with HEP to increase functional use of RUE during daily tasks.   Time 4   Period Weeks   Status Achieved     OT SHORT TERM GOAL #2   Title Patient will increase grip strength by 10# to increase ability to hold onto normal household items without dropping.    Baseline Pt is currently at 25# of grip strength which is considered 50% of the average norm.   Time 4   Period Weeks   Status Not Met     OT SHORT TERM GOAL #3   Title Patient will increase pinch strength by 2-3# to increase ability to pinch and hold smaller items when needed with her right hand.   Baseline at eval: lateral pinch: 6 3 point pinch: 5   Time 4   Period Weeks   Status Achieved     OT SHORT TERM GOAL #4   Title Patient will increase right wrist strength tp 4+/5 with decreased pain in order to return to completing normal daily tasks with more comfort and stability.   Time 4   Period Weeks   Status Achieved     OT SHORT TERM GOAL #5   Title Patient will increase right wrist A/ROM to the following: flexion: 40 degrees, extension: 35 degrees, and ulnar deviation: 12 degrees according to the average norms and expected outcomes based on surgery in order to perform all functional tasks with less difficulty.   Baseline At eval: Wrist flexion: 26, extension: 30, ulnar deviation: 0, and radial deviation 32 (at expected outcome)   Time 4   Period Weeks   Status Partially Met                  Plan - 09/25/16 1212    Clinical Impression Statement A: Reassessment completed this session, pt has met 3/5 goals and has partially met an additional goal. Pt has made excellent progress and lacks 4 degrees of goal wrist flexion, and 5# in grip strength. Pt reports she is now able to complete all ADL  tasks and preferred household tasks within  the 5# weight restriction. Occasional soreness if overuses the wrist. Pt is agreeable to discharge with HEP.    Plan P: Discharge pt   OT Home Exercise Plan 12/4: wrist stretches, wrist strengthening, and red theraputty exercises; October 01, 2023: upgraded to green theraputty, wrist flexion stretch   Consulted and Agree with Plan of Care Patient      Patient will benefit from skilled therapeutic intervention in order to improve the following deficits and impairments:  Decreased strength, Decreased range of motion, Pain  Visit Diagnosis: Other symptoms and signs involving the musculoskeletal system  Stiffness of right wrist, not elsewhere classified  Pain in right wrist      G-Codes - 2016-09-30 November 12, 1215    Functional Assessment Tool Used FOTO Score: 59/100 (41% impairment)   Functional Limitation Carrying, moving and handling objects   Carrying, Moving and Handling Objects Goal Status ([id number]) At least 20 percent but less than 40 percent impaired, limited or restricted   Carrying, Moving and Handling Objects Discharge Status [phone]) At least 40 percent but less than 60 percent impaired, limited or restricted      Problem List Patient Active Problem List   Diagnosis Date Noted  . Abdominal cramping 08/30/2016  . At high risk for injury related to fall 08/30/2016  . Vitamin D deficiency 08/07/2016  . Compression fracture of first lumbar vertebra ([place]) 07/15/2016  . Fracture of right wrist 07/01/2015  . FH: CAD (coronary artery disease) 10/31/2014  . Hypothyroidism 02/26/2014  . Backache 11/12/2010  . Osteoporosis 03/26/2010  . CHEST PAIN-PRECORDIAL 01/14/2010  . Dyslipidemia 02/16/2008   Guadelupe Sabin, OTR/L  [phone] 2016/09/30, 12:18 PM  [facility] [address] [place], [place], [address] Phone: [phone]   Fax:  [phone]  Name: Amanda Harmon MRN: [id number] Date of Birth: 09-May-1946       OCCUPATIONAL THERAPY DISCHARGE SUMMARY  Visits from Start of Care: 7  Current functional level related to goals / functional outcomes: See above. Pt has made great progress towards goals, reporting she is now able to complete all ADL and leisure tasks within her 5# weight limit instructed by MD.    Remaining deficits: Pt continues to have deficits in ROM, namely wrist flexion. Occasional soreness if she overuses the wrist with daily tasks.    Education / Equipment: Provided pt with green theraputty for continued strengthening, wrist flexion stretch Plan: Patient agrees to discharge.  Patient goals were met. Patient is being discharged due to meeting the stated rehab goals.  ?????

## 2016-09-25 NOTE — Patient Instructions (Addendum)
1) Wrist Flexion stretch:  Place wrist over edge of table and use opposite hand to gently stretch wrist down. Perform 5x, 10 seconds each

## 2016-10-01 DIAGNOSIS — M92211 Osteochondrosis (juvenile) of carpal lunate [Kienbock], right hand: Secondary | ICD-10-CM | POA: Diagnosis not present

## 2016-10-01 DIAGNOSIS — Z4789 Encounter for other orthopedic aftercare: Secondary | ICD-10-CM | POA: Diagnosis not present

## 2016-10-22 ENCOUNTER — Encounter: Payer: Self-pay | Admitting: Family Medicine

## 2016-10-22 ENCOUNTER — Ambulatory Visit (INDEPENDENT_AMBULATORY_CARE_PROVIDER_SITE_OTHER): Payer: Medicare Other | Admitting: Family Medicine

## 2016-10-22 VITALS — BP 120/82 | HR 74 | Resp 16 | Ht 67.0 in | Wt 130.4 lb

## 2016-10-22 DIAGNOSIS — M816 Localized osteoporosis [Lequesne]: Secondary | ICD-10-CM | POA: Diagnosis not present

## 2016-10-22 DIAGNOSIS — J029 Acute pharyngitis, unspecified: Secondary | ICD-10-CM

## 2016-10-22 LAB — POCT RAPID STREP A (OFFICE): Rapid Strep A Screen: NEGATIVE

## 2016-10-22 NOTE — Assessment & Plan Note (Signed)
Pain free and healed

## 2016-10-22 NOTE — Progress Notes (Signed)
   Amanda Harmon     MRN: [id number]      DOB: [birth year]   HPI Amanda Harmon 3 week h/o intermittent sore throat , hoarseness. Denies fever, chills, nasal drainage or cough Has also had mild loss of voice. Will visit grandbabies in next 2 weeks and wants to be sure not contagious  Reports resolution of back pain  ROS Denies recent fever or chills. Denies sinus pressure, nasal congestion, ear pain . Denies chest congestion, productive cough or wheezing.  Denies headaches, seizures, numbness, or tingling. Denies depression, anxiety or insomnia. Denies skin break down or rash.   PE  BP 120/82   Pulse 74   Resp 16   Ht 5\' 7"  (1.702 m)   Wt 130 lb 6.4 oz (59.1 kg)   SpO2 99%   BMI 20.42 kg/m   Patient alert and oriented and in no cardiopulmonary distress.  HEENT: No facial asymmetry, EOMI,   oropharynx pink and moist.  Neck supple no JVD, no mass.No cervical adenopathy  Chest: Clear to auscultation bilaterally.  CVS: S1, S2 no murmurs, no S3.Regular rate.  Ext: No edema  MS: Adequate ROM spine, shoulders, hips and knees.    Assessment & Plan  Sore throat Rapid strep test negative. History and exam not consistent with strep throat. Pt reassured, supportive measures only, salt water gargle and honey  Compression fracture of first lumbar vertebra ([facility]) Pain free and healed  Osteoporosis Continue fosamax, managed by endo

## 2016-10-22 NOTE — Assessment & Plan Note (Signed)
Rapid strep test negative. History and exam not consistent with strep throat. Pt reassured, supportive measures only, salt water gargle and honey

## 2016-10-22 NOTE — Assessment & Plan Note (Signed)
Continue fosamax, managed by endo

## 2016-10-22 NOTE — Patient Instructions (Signed)
F/u as before, call if you need me sooner  Rapid strep test is negative, and history and exam normal  No infection to be treated  Voice rest, salt water gargles,honey will help

## 2016-11-17 DIAGNOSIS — Z4789 Encounter for other orthopedic aftercare: Secondary | ICD-10-CM | POA: Diagnosis not present

## 2016-11-17 DIAGNOSIS — M92211 Osteochondrosis (juvenile) of carpal lunate [Kienbock], right hand: Secondary | ICD-10-CM | POA: Diagnosis not present

## 2016-12-03 ENCOUNTER — Encounter: Payer: Self-pay | Admitting: Cardiovascular Disease

## 2016-12-08 ENCOUNTER — Other Ambulatory Visit: Payer: Self-pay | Admitting: "Endocrinology

## 2016-12-08 DIAGNOSIS — E559 Vitamin D deficiency, unspecified: Secondary | ICD-10-CM | POA: Diagnosis not present

## 2016-12-08 DIAGNOSIS — E785 Hyperlipidemia, unspecified: Secondary | ICD-10-CM | POA: Diagnosis not present

## 2016-12-08 DIAGNOSIS — E038 Other specified hypothyroidism: Secondary | ICD-10-CM | POA: Diagnosis not present

## 2016-12-08 LAB — LIPID PANEL
CHOL/HDL RATIO: 3.1 ratio (ref <5.0)
Cholesterol: 206 mg/dL — ABNORMAL HIGH (ref <200)
HDL: 66 mg/dL (ref >50)
LDL CALC: 116 mg/dL — AB (ref <100)
Triglycerides: 121 mg/dL (ref <150)
VLDL: 24 mg/dL (ref <30)

## 2016-12-08 LAB — COMPREHENSIVE METABOLIC PANEL
ALT: 10 U/L (ref 6–29)
AST: 14 U/L (ref 10–35)
Albumin: 4 g/dL (ref 3.6–5.1)
Alkaline Phosphatase: 59 U/L (ref 33–130)
BUN: 13 mg/dL (ref 7–25)
CHLORIDE: 106 mmol/L (ref 98–110)
CO2: 32 mmol/L — AB (ref 20–31)
Calcium: 9.2 mg/dL (ref 8.6–10.4)
Creat: 0.54 mg/dL — ABNORMAL LOW (ref 0.60–0.93)
GLUCOSE: 84 mg/dL (ref 65–99)
POTASSIUM: 4.2 mmol/L (ref 3.5–5.3)
Sodium: 144 mmol/L (ref 135–146)
Total Bilirubin: 0.6 mg/dL (ref 0.2–1.2)
Total Protein: 6.1 g/dL (ref 6.1–8.1)

## 2016-12-08 LAB — TSH: TSH: 1.98 mIU/L

## 2016-12-08 LAB — T4, FREE: Free T4: 1.2 ng/dL (ref 0.8–1.8)

## 2016-12-09 ENCOUNTER — Encounter: Payer: Self-pay | Admitting: Family Medicine

## 2016-12-09 LAB — VITAMIN D 25 HYDROXY (VIT D DEFICIENCY, FRACTURES): VIT D 25 HYDROXY: 54 ng/mL (ref 30–100)

## 2016-12-10 ENCOUNTER — Ambulatory Visit (INDEPENDENT_AMBULATORY_CARE_PROVIDER_SITE_OTHER): Payer: Medicare Other | Admitting: Cardiovascular Disease

## 2016-12-10 VITALS — BP 128/70 | HR 65 | Ht 67.0 in | Wt 130.6 lb

## 2016-12-10 DIAGNOSIS — R9431 Abnormal electrocardiogram [ECG] [EKG]: Secondary | ICD-10-CM | POA: Diagnosis not present

## 2016-12-10 NOTE — Patient Instructions (Addendum)
Medication Instructions:  Your physician recommends that you continue on your current medications as directed. Please refer to the Current Medication list given to you today.   Labwork: none  Testing/Procedures: none  Follow-Up: Your physician recommends that you schedule a follow-up appointment in: 2 years.  Please call our office in January 2020 to schedule this appointment.     Any Other Special Instructions Will Be Listed Below (If Applicable).     If you need a refill on your cardiac medications before your next appointment, please call your pharmacy.

## 2016-12-10 NOTE — Progress Notes (Signed)
Chief Complaint  Patient presents with  . Follow-up      History of Present Illness: 71 yo WF with history of borderline hyperlipidemia who is here today for cardiac follow up. She had atypical chest pain in 2011 during a stressful situation and then other episodes of chest pain and dyspnea at rest in 2012. Echo 02/17/11 with normal LV systolic function, grade 2 diastolic dysfunction. Exercise stress test 02/17/11 with good exercise tolerance and no ischemic EKG changes. Her EKG chronically shows poor R wave progression in the precordial leads but this does not represent a prior infarct. Last seen here in 2016 and she was doing well.    She tells me that she has been feeling well. She has no chest pain, SOB, dizziness, palpitations. She has been very active and exercises several days per week.  She has many bone and joint issues.   Primary care: Tula Nakayama, MD   Past Medical History:  Diagnosis Date  . 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 Cystitis   . Hyperlipidemia    borderline   . Mitral valve prolapse 1984  . Thyroid disease 2015   hypothyroidism  . Vertigo     Past Surgical History:  Procedure Laterality Date  . BACK SURGERY  2008  . BREAST BIOPSY  1998   left - benign  . CARPECTOMY HAND Right 06/22/2016  . COLONOSCOPY N/A 01/17/2015   Procedure: COLONOSCOPY;  Surgeon: Rogene Houston, MD;  [facility];  Service: Endoscopy;  Laterality: N/A;  830 - moved to 4/21 @ 8:30  . EYE SURGERY Left 12/01/2013   cataract  . EYE SURGERY Right 12/15/2013   cataract  . IR GENERIC HISTORICAL  08/19/2016   IR RADIOLOGIST EVAL & MGMT 08/19/2016 [facility]  . SPINE SURGERY    . TONSILLECTOMY  1965  . TUBAL LIGATION  1983    Current Outpatient Prescriptions  Medication Sig Dispense Refill  . alendronate (FOSAMAX) 70 MG tablet TAKE ONE TABLET BY MOUTH ONCE A WEEK, TAKE WITH A FULL GLASS OF WATER ON AN EMPTY STOMACH 12 tablet 1  . Ascorbic Acid (VITAMIN C)  1000 MG tablet Take 1,000 mg by mouth daily.    . Calcium Carbonate-Vit D-Min (CALCIUM 1200 PO) Take 1 tablet by mouth daily.    . Cholecalciferol (VITAMIN D3) 5000 units CAPS Take 1 capsule (5,000 Units total) by mouth daily. 90 capsule 0  . ketotifen (ZADITOR) 0.025 % ophthalmic solution Place 1 drop into both eyes every morning.    Marland Kitchen levothyroxine (SYNTHROID, LEVOTHROID) 50 MCG tablet TAKE ONE TABLET BY MOUTH ONCE DAILY IN THE MORNING 30 tablet 4  . pyridOXINE (VITAMIN B-6) 100 MG tablet Take 100 mg by mouth daily.     No current facility-administered medications for this visit.     Allergies  Allergen Reactions  . Aspirin Other (See Comments)    Bruising  . Kenedy Extract] Rash    Social History   Social History  . Marital status: Married    Spouse name: N/A  . Number of children: 3  . Years of education: N/A   Occupational History  . Retired-preschool teacher Retired   Social History Main Topics  . Smoking status: Former Research scientist (life sciences)  . Smokeless tobacco: Never Used  . Alcohol use Yes     Comment: socially   . Drug use: No  . Sexual activity: Yes   Other Topics Concern  . Not on  file   Social History Narrative  . No narrative on file    Family History  Problem Relation Age of Onset  . Heart attack Mother 27    used tobacco   . Arthritis Mother     severe   . Heart attack Father 38    used tobacco   . Alcohol abuse Father   . Cancer Brother     prostate   . Osteoporosis Sister     severe     Review of Systems:  As stated in the HPI and otherwise negative.   BP 128/70   Pulse 65   Ht 5\' 7"  (1.702 m)   Wt 130 lb 9.6 oz (59.2 kg)   BMI 20.45 kg/m   Physical Examination: General: Well developed, well nourished, NAD  HEENT: OP clear, mucus membranes moist  SKIN: warm, dry. No rashes. Neuro: No focal deficits  Musculoskeletal: Muscle strength 5/5 all ext  Psychiatric: Mood and affect normal  Neck: No JVD, no carotid bruits, no  thyromegaly, no lymphadenopathy.  Lungs:Clear bilaterally, no wheezes, rhonci, crackles Cardiovascular: Regular rate and rhythm. No murmurs, gallops or rubs. Abdomen:Soft. Bowel sounds present. Non-tender.  Extremities: No lower extremity edema. Pulses are 2 + in the bilateral DP/PT.  Echo 02/17/11: Left ventricle: The cavity size was normal. Wall thickness was normal. Systolic function was normal. The estimated ejection fraction was in the range of 55% to 60%. Wall motion was normal; there were no regional wall motion abnormalities. Features are consistent with a pseudonormal left ventricular filling pattern, with concomitant abnormal relaxation and increased filling pressure (grade 2 diastolic dysfunction). - Atrial septum: A patent foramen ovale cannot be excluded.  EKG:  EKG is ordered today. The ekg ordered today demonstrates Sinus, 65 bpm. Biatrial enlargement.   Recent Labs: 04/06/2016: Hemoglobin 14.2; Platelets 225 12/08/2016: ALT 10; BUN 13; Creat 0.54; Potassium 4.2; Sodium 144; TSH 1.98   Lipid Panel    Component Value Date/Time   CHOL 206 (H) 12/08/2016 0829   TRIG 121 12/08/2016 0829   HDL 66 12/08/2016 0829   CHOLHDL 3.1 12/08/2016 0829   VLDL 24 12/08/2016 0829   LDLCALC 116 (H) 12/08/2016 0829     Wt Readings from Last 3 Encounters:  12/10/16 130 lb 9.6 oz (59.2 kg)  10/22/16 130 lb 6.4 oz (59.1 kg)  08/27/16 128 lb 12.8 oz (58.4 kg)     Other studies Reviewed: Additional studies/ records that were reviewed today include: . Review of the above records demonstrates:   Assessment and Plan:   1. Abnormal EKG: Her EKG shows poor R wave progression in the precordial leads. This is chronic. She had a normal stress test and echo in 2012. She has no symptoms concerning for angina, CHF or arrhythmias. No further workup at this time.   Current medicines are reviewed at length with the patient today.  The patient does not have concerns regarding  medicines.  The following changes have been made:  no change  Labs/ tests ordered today include:   Orders Placed This Encounter  Procedures  . EKG 12-Lead     Disposition:   FU with me in 24 months   Signed, Lauree Chandler, MD 12/10/2016 1:44 PM    [facility] [address], [place], [place]  [address] Phone: [phone]; Fax: [phone]

## 2016-12-15 ENCOUNTER — Ambulatory Visit: Payer: Medicare Other

## 2016-12-15 ENCOUNTER — Ambulatory Visit (INDEPENDENT_AMBULATORY_CARE_PROVIDER_SITE_OTHER): Payer: Medicare Other

## 2016-12-15 ENCOUNTER — Other Ambulatory Visit: Payer: Self-pay

## 2016-12-15 VITALS — BP 140/88 | HR 72 | Temp 98.9°F | Ht 67.0 in | Wt 130.0 lb

## 2016-12-15 DIAGNOSIS — Z1231 Encounter for screening mammogram for malignant neoplasm of breast: Secondary | ICD-10-CM | POA: Diagnosis not present

## 2016-12-15 DIAGNOSIS — Z1239 Encounter for other screening for malignant neoplasm of breast: Secondary | ICD-10-CM

## 2016-12-15 DIAGNOSIS — Z0000 Encounter for general adult medical examination without abnormal findings: Secondary | ICD-10-CM

## 2016-12-15 DIAGNOSIS — M8580 Other specified disorders of bone density and structure, unspecified site: Secondary | ICD-10-CM

## 2016-12-15 MED ORDER — SULFAMETHOXAZOLE-TRIMETHOPRIM 800-160 MG PO TABS: 1.0000 | ORAL_TABLET | Freq: Two times a day (BID) | ORAL | 1 refills | Status: DC | PRN

## 2016-12-15 MED ORDER — ALENDRONATE SODIUM 70 MG PO TABS: 70.0000 mg | ORAL_TABLET | ORAL | 3 refills | Status: DC

## 2016-12-15 NOTE — Patient Instructions (Addendum)
Amanda Harmon , Thank you for taking time to come for your Medicare Wellness Visit. I appreciate your ongoing commitment to your health goals. Please review the following plan we discussed and let me know if I can assist you in the future.   Screening recommendations/referrals: Colonoscopy: Up to date and no longer required Mammogram: Due NOW, this has been ordered for you, please call [facility] and schedule this as soon as you are available. Bone Density: Up to date Recommended yearly ophthalmology/optometry visit for glaucoma screening and checkup Recommended yearly dental visit for hygiene and checkup  Vaccinations: Influenza vaccine: Up to date, due 06/2017 Pneumococcal vaccine: Up to date Tdap vaccine: Up to date, due 11/2022 Shingles vaccine: Up to date    Advanced directives: Advance directive discussed with you today. I have provided a copy for you to complete at home and have notarized. Once this is complete please bring a copy in to our office so we can scan it into your chart.  Conditions/risks identified: None  Next appointment: Follow up in 4 months for your yearly exam. Follow up in 1 year for your annual wellness visit.   Preventive Care 71 Years and Older, Female Preventive care refers to lifestyle choices and visits with your health care provider that can promote health and wellness. What does preventive care include?  A yearly physical exam. This is also called an annual well check.  Dental exams once or twice a year.  Routine eye exams. Ask your health care provider how often you should have your eyes checked.  Personal lifestyle choices, including:  Daily care of your teeth and gums.  Regular physical activity.  Eating a healthy diet.  Avoiding tobacco and drug use.  Limiting alcohol use.  Practicing safe sex.  Taking low-dose aspirin every day.  Taking vitamin and mineral supplements as recommended by your health care provider. What happens during an  annual well check? The services and screenings done by your health care provider during your annual well check will depend on your age, overall health, lifestyle risk factors, and family history of disease. Counseling  Your health care provider may ask you questions about your:  Alcohol use.  Tobacco use.  Drug use.  Emotional well-being.  Home and relationship well-being.  Sexual activity.  Eating habits.  History of falls.  Memory and ability to understand (cognition).  Work and work Statistician.  Reproductive health. Screening  You may have the following tests or measurements:  Height, weight, and BMI.  Blood pressure.  Lipid and cholesterol levels. These may be checked every 5 years, or more frequently if you are over 36 years old.  Skin check.  Lung cancer screening. You may have this screening every year starting at age 30 if you have a 30-pack-year history of smoking and currently smoke or have quit within the past 15 years.  Fecal occult blood test (FOBT) of the stool. You may have this test every year starting at age 3.  Flexible sigmoidoscopy or colonoscopy. You may have a sigmoidoscopy every 5 years or a colonoscopy every 10 years starting at age 74.  Hepatitis C blood test.  Hepatitis B blood test.  Sexually transmitted disease (STD) testing.  Diabetes screening. This is done by checking your blood sugar (glucose) after you have not eaten for a while (fasting). You may have this done every 1-3 years.  Bone density scan. This is done to screen for osteoporosis. You may have this done starting at age 58.  Mammogram. This may be done every 1-2 years. Talk to your health care provider about how often you should have regular mammograms. Talk with your health care provider about your test results, treatment options, and if necessary, the need for more tests. Vaccines  Your health care provider may recommend certain vaccines, such as:  Influenza  vaccine. This is recommended every year.  Tetanus, diphtheria, and acellular pertussis (Tdap, Td) vaccine. You may need a Td booster every 10 years.  Zoster vaccine. You may need this after age 46.  Pneumococcal 13-valent conjugate (PCV13) vaccine. One dose is recommended after age 49.  Pneumococcal polysaccharide (PPSV23) vaccine. One dose is recommended after age 59. Talk to your health care provider about which screenings and vaccines you need and how often you need them. This information is not intended to replace advice given to you by your health care provider. Make sure you discuss any questions you have with your health care provider. Document Released: 10/11/2015 Document Revised: 06/03/2016 Document Reviewed: 07/16/2015 Elsevier Interactive Patient Education  2017 [place] Prevention in the Home Falls can cause injuries. They can happen to people of all ages. There are many things you can do to make your home safe and to help prevent falls. What can I do on the outside of my home?  Regularly fix the edges of walkways and driveways and fix any cracks.  Remove anything that might make you trip as you walk through a door, such as a raised step or threshold.  Trim any bushes or trees on the path to your home.  Use bright outdoor lighting.  Clear any walking paths of anything that might make someone trip, such as rocks or tools.  Regularly check to see if handrails are loose or broken. Make sure that both sides of any steps have handrails.  Any raised decks and porches should have guardrails on the edges.  Have any leaves, snow, or ice cleared regularly.  Use sand or salt on walking paths during winter.  Clean up any spills in your garage right away. This includes oil or grease spills. What can I do in the bathroom?  Use night lights.  Install grab bars by the toilet and in the tub and shower. Do not use towel bars as grab bars.  Use non-skid mats or decals  in the tub or shower.  If you need to sit down in the shower, use a plastic, non-slip stool.  Keep the floor dry. Clean up any water that spills on the floor as soon as it happens.  Remove soap buildup in the tub or shower regularly.  Attach bath mats securely with double-sided non-slip rug tape.  Do not have throw rugs and other things on the floor that can make you trip. What can I do in the bedroom?  Use night lights.  Make sure that you have a light by your bed that is easy to reach.  Do not use any sheets or blankets that are too big for your bed. They should not hang down onto the floor.  Have a firm chair that has side arms. You can use this for support while you get dressed.  Do not have throw rugs and other things on the floor that can make you trip. What can I do in the kitchen?  Clean up any spills right away.  Avoid walking on wet floors.  Keep items that you use a lot in easy-to-reach places.  If you need to reach  something above you, use a strong step stool that has a grab bar.  Keep electrical cords out of the way.  Do not use floor polish or wax that makes floors slippery. If you must use wax, use non-skid floor wax.  Do not have throw rugs and other things on the floor that can make you trip. What can I do with my stairs?  Do not leave any items on the stairs.  Make sure that there are handrails on both sides of the stairs and use them. Fix handrails that are broken or loose. Make sure that handrails are as long as the stairways.  Check any carpeting to make sure that it is firmly attached to the stairs. Fix any carpet that is loose or worn.  Avoid having throw rugs at the top or bottom of the stairs. If you do have throw rugs, attach them to the floor with carpet tape.  Make sure that you have a light switch at the top of the stairs and the bottom of the stairs. If you do not have them, ask someone to add them for you. What else can I do to help  prevent falls?  Wear shoes that:  Do not have high heels.  Have rubber bottoms.  Are comfortable and fit you well.  Are closed at the toe. Do not wear sandals.  If you use a stepladder:  Make sure that it is fully opened. Do not climb a closed stepladder.  Make sure that both sides of the stepladder are locked into place.  Ask someone to hold it for you, if possible.  Clearly mark and make sure that you can see:  Any grab bars or handrails.  First and last steps.  Where the edge of each step is.  Use tools that help you move around (mobility aids) if they are needed. These include:  Canes.  Walkers.  Scooters.  Crutches.  Turn on the lights when you go into a dark area. Replace any light bulbs as soon as they burn out.  Set up your furniture so you have a clear path. Avoid moving your furniture around.  If any of your floors are uneven, fix them.  If there are any pets around you, be aware of where they are.  Review your medicines with your doctor. Some medicines can make you feel dizzy. This can increase your chance of falling. Ask your doctor what other things that you can do to help prevent falls. This information is not intended to replace advice given to you by your health care provider. Make sure you discuss any questions you have with your health care provider. Document Released: 07/11/2009 Document Revised: 02/20/2016 Document Reviewed: 10/19/2014 Elsevier Interactive Patient Education  2017 [employer].

## 2016-12-15 NOTE — Progress Notes (Signed)
Subjective:   Amanda Harmon is a 71 y.o. female who presents for Medicare Annual (Subsequent) preventive examination.  Review of Systems:  Cardiac Risk Factors include: advanced age (>82men, >1 women);dyslipidemia     Objective:     Vitals: Pulse 72   Temp 98.9 F (37.2 C) (Oral)   Ht 5\' 7"  (1.702 m)   Wt 130 lb 0.6 oz (59 kg)   SpO2 96%   BMI 20.37 kg/m   Body mass index is 20.37 kg/m.   Tobacco History  Smoking Status  . Former Smoker  . Packs/day: 1.00  . Years: 4.00  . Quit date: 12/15/1968  Smokeless Tobacco  . Never Used     Counseling given: Not Answered   Past Medical History:  Diagnosis Date  . 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 Cystitis   . Hyperlipidemia    borderline   . Mitral valve prolapse 1984  . Thyroid disease 2015   hypothyroidism  . Vertigo    Past Surgical History:  Procedure Laterality Date  . BACK SURGERY  2008  . BREAST BIOPSY  1998   left - benign  . CARPECTOMY HAND Right 06/22/2016  . COLONOSCOPY N/A 01/17/2015   Procedure: COLONOSCOPY;  Surgeon: Rogene Houston, MD;  [facility];  Service: Endoscopy;  Laterality: N/A;  830 - moved to 4/21 @ 8:30  . EYE SURGERY Left 12/01/2013   cataract  . EYE SURGERY Right 12/15/2013   cataract  . IR GENERIC HISTORICAL  08/19/2016   IR RADIOLOGIST EVAL & MGMT 08/19/2016 [facility]  . SPINE SURGERY    . TONSILLECTOMY  1965  . TUBAL LIGATION  1983   Family History  Problem Relation Age of Onset  . Heart attack Mother 35    used tobacco   . Arthritis Mother     severe   . Heart attack Father 25    used tobacco   . Alcohol abuse Father   . Cancer Brother 26    prostate   . Osteoporosis Sister     severe   . Osteoarthritis Sister    History  Sexual Activity  . Sexual activity: Yes    Outpatient Encounter Prescriptions as of 12/15/2016  Medication Sig  . alendronate (FOSAMAX) 70 MG tablet TAKE ONE TABLET BY MOUTH ONCE A WEEK, TAKE WITH A FULL GLASS OF  WATER ON AN EMPTY STOMACH  . Ascorbic Acid (VITAMIN C) 1000 MG tablet Take 1,000 mg by mouth daily.  . Calcium Carbonate-Vit D-Min (CALCIUM 1200 PO) Take 1 tablet by mouth daily.  . Cholecalciferol (VITAMIN D3) 5000 units CAPS Take 1 capsule (5,000 Units total) by mouth daily.  Marland Kitchen ketotifen (ZADITOR) 0.025 % ophthalmic solution Place 1 drop into both eyes every morning.  Marland Kitchen levothyroxine (SYNTHROID, LEVOTHROID) 50 MCG tablet TAKE ONE TABLET BY MOUTH ONCE DAILY IN THE MORNING  . sulfamethoxazole-trimethoprim (BACTRIM DS,SEPTRA DS) 800-160 MG tablet Take 1 tablet by mouth 2 (two) times daily as needed.  . [DISCONTINUED] pyridOXINE (VITAMIN B-6) 100 MG tablet Take 100 mg by mouth daily.   No facility-administered encounter medications on file as of 12/15/2016.     Activities of Daily Living In your present state of health, do you have any difficulty performing the following activities: 12/15/2016  Hearing? N  Vision? N  Difficulty concentrating or making decisions? Y  Walking or climbing stairs? N  Dressing or bathing? N  Doing errands, shopping? N  Preparing Food and eating ?  N  Using the Toilet? N  In the past six months, have you accidently leaked urine? N  Do you have problems with loss of bowel control? N  Managing your Medications? N  Managing your Finances? N  Housekeeping or managing your Housekeeping? N  Some recent data might be hidden    Patient Care Team: Fayrene Helper, MD as PCP - General (Family Medicine) Iran Planas, MD as Consulting Physician (Orthopedic Surgery) Burnell Blanks, MD as Consulting Physician (Cardiology)    Assessment:    Exercise Activities and Dietary recommendations Current Exercise Habits: Home exercise routine, Type of exercise: walking, Time (Minutes): 30, Frequency (Times/Week): 7, Weekly Exercise (Minutes/Week): 210, Intensity: Mild, Exercise limited by: None identified  Goals    . Exercise strength training          Patient  would like to start strength training 3 days a week for 30 minutes at a time.       Fall Risk Fall Risk  12/15/2016 08/14/2016 04/13/2016 11/05/2015 10/28/2015  Falls in the past year? Yes No No Yes No  Number falls in past yr: 2 or more - - 2 or more -  Injury with Fall? Yes - - Yes -  Risk for fall due to : - - - Other (Comment) -  Follow up Falls evaluation completed;Falls prevention discussed - - Falls evaluation completed;Education provided -   Depression Screen PHQ 2/9 Scores 12/15/2016 08/14/2016 04/13/2016 11/05/2015  PHQ - 2 Score 0 0 0 0  PHQ- 9 Score - - - -     Cognitive Function: Normal   6CIT Screen 12/15/2016  What Year? 0 points  What month? 0 points  What time? 0 points  Count back from 20 0 points  Months in reverse 0 points  Repeat phrase 0 points  Total Score 0    Immunization History  Administered Date(s) Administered  . Influenza Split 06/17/2014  . Influenza,inj,Quad PF,36+ Mos 07/20/2013, 07/01/2015, 07/15/2016  . Pneumococcal Conjugate-13 10/30/2014  . Pneumococcal Polysaccharide-23 03/23/2012  . Tdap 12/09/2012  . Zoster 02/24/2007   Screening Tests Health Maintenance  Topic Date Due  . MAMMOGRAM  12/22/2017  . TETANUS/TDAP  12/10/2022  . COLONOSCOPY  01/16/2025  . INFLUENZA VACCINE  Completed  . DEXA SCAN  Completed  . Hepatitis C Screening  Completed  . PNA vac Low Risk Adult  Completed      Plan:  I have personally reviewed and addressed the Medicare Annual Wellness questionnaire and have noted the following in the patient's chart:  A. Medical and social history B. Use of alcohol, tobacco or illicit drugs  C. Current medications and supplements D. Functional ability and status E.  Nutritional status F.  Physical activity G. Advance directives H. List of other physicians I.  Hospitalizations, surgeries, and ER visits in previous 12 months J.  [place] to include cognitive, depression, and falls L. Referrals and appointments  - none  In addition, I have reviewed and discussed with patient certain preventive protocols, quality metrics, and best practice recommendations. A written personalized care plan for preventive services as well as general preventive health recommendations were provided to patient.  Signed,   Stormy Fabian, LPN Lead Nurse Health Advisor

## 2016-12-21 ENCOUNTER — Other Ambulatory Visit: Payer: Self-pay | Admitting: "Endocrinology

## 2017-01-20 DIAGNOSIS — L728 Other follicular cysts of the skin and subcutaneous tissue: Secondary | ICD-10-CM | POA: Diagnosis not present

## 2017-02-11 ENCOUNTER — Ambulatory Visit: Payer: Medicare Other | Admitting: "Endocrinology

## 2017-02-12 ENCOUNTER — Ambulatory Visit (INDEPENDENT_AMBULATORY_CARE_PROVIDER_SITE_OTHER): Payer: Medicare Other | Admitting: "Endocrinology

## 2017-02-12 ENCOUNTER — Encounter: Payer: Self-pay | Admitting: "Endocrinology

## 2017-02-12 VITALS — BP 130/74 | HR 69 | Ht 67.0 in | Wt 135.0 lb

## 2017-02-12 DIAGNOSIS — M8580 Other specified disorders of bone density and structure, unspecified site: Secondary | ICD-10-CM | POA: Diagnosis not present

## 2017-02-12 DIAGNOSIS — M8000XG Age-related osteoporosis with current pathological fracture, unspecified site, subsequent encounter for fracture with delayed healing: Secondary | ICD-10-CM

## 2017-02-12 DIAGNOSIS — E038 Other specified hypothyroidism: Secondary | ICD-10-CM | POA: Diagnosis not present

## 2017-02-12 MED ORDER — LEVOTHYROXINE SODIUM 50 MCG PO TABS: 50.0000 ug | ORAL_TABLET | Freq: Every morning | ORAL | 3 refills | Status: DC

## 2017-02-12 NOTE — Progress Notes (Signed)
Subjective:    Patient ID: Amanda Harmon, female    DOB: 09/28/1946, PCP Fayrene Helper, MD   Past Medical History:  Diagnosis Date  . 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 Cystitis   . Hyperlipidemia    borderline   . Mitral valve prolapse 1984  . Thyroid disease 2015   hypothyroidism  . Vertigo    Past Surgical History:  Procedure Laterality Date  . BACK SURGERY  2008  . BREAST BIOPSY  1998   left - benign  . CARPECTOMY HAND Right 06/22/2016  . COLONOSCOPY N/A 01/17/2015   Procedure: COLONOSCOPY;  Surgeon: Rogene Houston, MD;  [facility];  Service: Endoscopy;  Laterality: N/A;  830 - moved to 4/21 @ 8:30  . EYE SURGERY Left 12/01/2013   cataract  . EYE SURGERY Right 12/15/2013   cataract  . IR GENERIC HISTORICAL  08/19/2016   IR RADIOLOGIST EVAL & MGMT 08/19/2016 [facility]  . SPINE SURGERY    . TONSILLECTOMY  1965  . TUBAL LIGATION  1983   Social History   Social History  . Marital status: Married    Spouse name: N/A  . Number of children: 3  . Years of education: N/A   Occupational History  . Retired-preschool teacher Retired   Social History Main Topics  . Smoking status: Former Smoker    Packs/day: 1.00    Years: 4.00    Quit date: 12/15/1968  . Smokeless tobacco: Never Used  . Alcohol use Yes     Comment: socially   . Drug use: No  . Sexual activity: Yes   Other Topics Concern  . None   Social History Narrative  . None   Outpatient Encounter Prescriptions as of 02/12/2017  Medication Sig  . alendronate (FOSAMAX) 70 MG tablet Take 1 tablet (70 mg total) by mouth once a week. Take with a full glass of water on an empty stomach.  . Ascorbic Acid (VITAMIN C) 1000 MG tablet Take 1,000 mg by mouth daily.  . Calcium Carbonate-Vit D-Min (CALCIUM 1200 PO) Take 1 tablet by mouth daily.  . Cholecalciferol (VITAMIN D3) 5000 units CAPS Take 1 capsule (5,000 Units total) by mouth daily.  Marland Kitchen ketotifen (ZADITOR) 0.025 %  ophthalmic solution Place 1 drop into both eyes every morning.  Marland Kitchen levothyroxine (SYNTHROID, LEVOTHROID) 50 MCG tablet Take 1 tablet (50 mcg total) by mouth every morning.  . [DISCONTINUED] levothyroxine (SYNTHROID, LEVOTHROID) 50 MCG tablet TAKE ONE TABLET EVERY MORNING  . [DISCONTINUED] sulfamethoxazole-trimethoprim (BACTRIM DS,SEPTRA DS) 800-160 MG tablet Take 1 tablet by mouth 2 (two) times daily as needed.   No facility-administered encounter medications on file as of 02/12/2017.    ALLERGIES: Allergies  Allergen Reactions  . Aspirin Other (See Comments)    Bruising  . Poison Oak Extract [Poison Oak Extract] Rash   VACCINATION STATUS: Immunization History  Administered Date(s) Administered  . Influenza Split 06/17/2014  . Influenza,inj,Quad PF,36+ Mos 07/20/2013, 07/01/2015, 07/15/2016  . Pneumococcal Conjugate-13 10/30/2014  . Pneumococcal Polysaccharide-23 03/23/2012  . Tdap 12/09/2012  . Zoster 02/24/2007    HPI  71 yr old female with medical hx as above. She is here to f/u  For her hypothyroidism and osteoporosis with repeat TFTs. For hypothyroidism, she was put on LT4 50 mcg po qam, she is tolerating well. - she remains on alendronate 70 mg by mouth weekly by Dr. Moshe Cipro her primary medical doctor. She continues to follow with her  orthopedist in [place],  her after x-ray showed an old hand fracture. No interval problems, she is allowed to use her hands , including for exercise. - Based on her repeat bone density , on 08/15/2016 she is regaining significant (9.5%) bone mass on the spine, now in the osteopenia range reversing from osteoporosis.  Her prior DXA on 06/13/2014 shows T score -2.2 on L3 ( L4 has -3.1 , but excluded due to advanced DJD). - Dual  Femur analysis showed non significant change since 2015 and still  considered osteopenic with a T score of -1.7 .   She denies hx of goiter. Her last menstrual period was at age 9. she has 3 grown children. she smoked  for 4 years in college. she never took steroids on chronic basis. she denies cold/heat intolerance. she is active , exercises regularly.  Last year, while walking,  she fell and sustained non displaced jaw fracture, recent x-ray on her right wrist showed old hand  fracture .    Review of Systems Constitutional: And 5 pounds since last visit- a good development for her, no fatigue, no subjective hyperthermia/hypothermia Eyes: no blurry vision, no xerophthalmia ENT: no sore throat, no nodules palpated in throat, no dysphagia/odynophagia, no hoarseness Cardiovascular: no CP/SOB/palpitations/leg swelling Respiratory: no cough/SOB Gastrointestinal: no N/V/D/C Musculoskeletal: no muscle/joint aches, her right wrist is out of  a cast. Skin: no rashes Neurological: no tremors/numbness/tingling/dizziness Psychiatric: no depression/anxiety  Objective:    BP 130/74   Pulse 69   Ht 5\' 7"  (1.702 m)   Wt 135 lb (61.2 kg)   BMI 21.14 kg/m   Wt Readings from Last 3 Encounters:  02/12/17 135 lb (61.2 kg)  12/15/16 130 lb 0.6 oz (59 kg)  12/10/16 130 lb 9.6 oz (59.2 kg)    Physical Exam  Constitutional: overweight, in NAD Eyes: PERRLA, EOMI, no exophthalmos ENT: moist mucous membranes, no thyromegaly, no cervical lymphadenopathy Cardiovascular: RRR, No MRG Respiratory: CTA B Gastrointestinal: abdomen soft, NT, ND, BS+ Musculoskeletal: no deformities, strength intact in all 4 Skin: moist, warm, no rashes Neurological: no tremor with outstretched hands, DTR normal in all 4  CMP     Component Value Date/Time   NA 144 12/08/2016 0826   K 4.2 12/08/2016 0826   CL 106 12/08/2016 0826   CO2 32 (H) 12/08/2016 0826   GLUCOSE 84 12/08/2016 0826   BUN 13 12/08/2016 0826   CREATININE 0.54 (L) 12/08/2016 0826   CALCIUM 9.2 12/08/2016 0826   PROT 6.1 12/08/2016 0826   ALBUMIN 4.0 12/08/2016 0826   AST 14 12/08/2016 0826   ALT 10 12/08/2016 0826   ALKPHOS 59 12/08/2016 0826   BILITOT 0.6  12/08/2016 0826   GFRNONAA >60 02/20/2015 2350   GFRAA >60 02/20/2015 2350   Lipid Panel     Component Value Date/Time   CHOL 206 (H) 12/08/2016 0829   TRIG 121 12/08/2016 0829   HDL 66 12/08/2016 0829   CHOLHDL 3.1 12/08/2016 0829   VLDL 24 12/08/2016 0829   LDLCALC 116 (H) 12/08/2016 0829     Assessment & Plan:   1.  hypothyroidism Her TFTs are c/w appropriate replacement. I will continue LT4 50 mcg po qam.  - We discussed about correct intake of levothyroxine, at fasting, with water, separated by at least 30 minutes from breakfast, and separated by more than 4 hours from calcium, iron, multivitamins, acid reflux medications (PPIs). -Patient is made aware of the fact that thyroid hormone replacement is needed for life,  dose to be adjusted by periodic monitoring of thyroid function tests.  2. Osteoporosis - Based on her most recent bone density, she is regaining some bone mass on the spine and no change on  Dual Femur BMD analysis. - This is better than expected outcome of therapy with alendronate. -  I encouraged her to stay on  alendronate 70 mg weekly for now. She has a propensity for falling, and  I advised her on precautions to avoid falling including walking on flat surface or treadmill instead of on outside pavements, and pick up  Strengthening exercises for back muscles. - She is status post therapy with vitamin D 50,000 units weekly for 6 months, her last vitamin D level was 54. - I advised her to continue vitamin D3 5,000 units daily until next visit in 6 months. -She will continue with calcium carbonate 1200 mg daily with lunch.  - She will have repeat bone density in November 2019. -If her BMD response is not satisfactory, Or if she develops interim fracture, she will be considered for Prolia.  3. Hyperlipidemia: - She has family history of hyperlipidemia and coronary artery disease. Her LDL is  116 slightly improving from 132 along with HDL at 66. She would like to  avoid therapy for now. Her LDL was 138 in January 2016 .  - I advised patient to maintain close follow up with Fayrene Helper, MD for primary care needs. Follow up plan: Return in about 6 months (around 08/15/2017) for follow up with pre-visit labs.  Glade Lloyd, MD Phone: [phone]  Fax: [phone]   02/12/2017, 11:52 AM

## 2017-02-20 ENCOUNTER — Encounter: Payer: Self-pay | Admitting: Family Medicine

## 2017-04-05 DIAGNOSIS — M5136 Other intervertebral disc degeneration, lumbar region: Secondary | ICD-10-CM | POA: Diagnosis not present

## 2017-04-13 ENCOUNTER — Encounter (HOSPITAL_COMMUNITY): Payer: Self-pay | Admitting: Physical Therapy

## 2017-04-13 ENCOUNTER — Ambulatory Visit (HOSPITAL_COMMUNITY): Payer: Medicare Other | Attending: Family Medicine | Admitting: Physical Therapy

## 2017-04-13 DIAGNOSIS — M62830 Muscle spasm of back: Secondary | ICD-10-CM | POA: Insufficient documentation

## 2017-04-13 DIAGNOSIS — R29898 Other symptoms and signs involving the musculoskeletal system: Secondary | ICD-10-CM | POA: Diagnosis not present

## 2017-04-13 DIAGNOSIS — M545 Low back pain: Secondary | ICD-10-CM | POA: Insufficient documentation

## 2017-04-13 DIAGNOSIS — M6281 Muscle weakness (generalized): Secondary | ICD-10-CM | POA: Insufficient documentation

## 2017-04-13 NOTE — Therapy (Signed)
[facility] [place], [place], [address] Phone: [phone]   Fax:  [phone]  Physical Therapy Evaluation  Patient Details  Name: Amanda Harmon MRN: [id number] Date of Birth: 1946-02-07 Referring Provider: Suella Broad, MD   Encounter Date: 04/13/2017      PT End of Session - 04/13/17 1540    Visit Number 1   Number of Visits 9   Date for PT Re-Evaluation 05/11/17   Authorization Type Medicare   Authorization Time Period 04/13/17 to 05/11/17   PT Start Time 1432   PT Stop Time 1515   PT Time Calculation (min) 43 min   Activity Tolerance Patient tolerated treatment well;No increased pain   Behavior During Therapy WFL for tasks assessed/performed      Past Medical History:  Diagnosis Date  . 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 Cystitis   . Hyperlipidemia    borderline   . Mitral valve prolapse 1984  . Thyroid disease 2015   hypothyroidism  . Vertigo     Past Surgical History:  Procedure Laterality Date  . BACK SURGERY  2008  . BREAST BIOPSY  1998   left - benign  . CARPECTOMY HAND Right 06/22/2016  . COLONOSCOPY N/A 01/17/2015   Procedure: COLONOSCOPY;  Surgeon: Rogene Houston, MD;  [facility];  Service: Endoscopy;  Laterality: N/A;  830 - moved to 4/21 @ 8:30  . EYE SURGERY Left 12/01/2013   cataract  . EYE SURGERY Right 12/15/2013   cataract  . IR GENERIC HISTORICAL  08/19/2016   IR RADIOLOGIST EVAL & MGMT 08/19/2016 [facility]  . SPINE SURGERY    . TONSILLECTOMY  1965  . TUBAL LIGATION  1983    There were no vitals filed for this visit.       Subjective Assessment - 04/13/17 1438    Subjective Pt reports that she had a fall back in the end of last year which resulted in a compression fracture. Pt reports managing this conservatively until her pain improved significantly until she stayed a week with her family sometime in June. She was doing alot of repetitive lifting  of her grandson and has noticed increase in low back pain by the end of that week.    Pertinent History Back surgery 2008, osteopenia/osteoporosis; HLD   Limitations Lifting;Standing   How long can you sit comfortably? unlimited    How long can you stand comfortably? while doing dishes notes tingling    How long can you walk comfortably? unlimited    Diagnostic tests none since the compression fracture   Patient Stated Goals decreased pain and strengthen muscles in the back   Currently in Pain? Yes   Pain Score 2    Pain Location Back   Pain Orientation Right   Pain Descriptors / Indicators Aching   Pain Type Acute pain   Pain Radiating Towards None    Pain Onset More than a month ago   Pain Frequency Intermittent   Aggravating Factors  standing too long, lifting   Pain Relieving Factors walking, resting    Effect of Pain on Daily Activities minimally limited             [facility] PT Assessment - 04/13/17 0001      Assessment   Medical Diagnosis Degenerative lumbar disc    Referring Provider Suella Broad, MD    Onset Date/Surgical Date --  June, acute on chronic exacerbation  Next MD Visit none for this    Prior Therapy none      Balance Screen   Has the patient fallen in the past 6 months No   Has the patient had a decrease in activity level because of a fear of falling?  No   Is the patient reluctant to leave their home because of a fear of falling?  No     Home Environment   Living Environment Private residence   Additional Comments stairs up to the 2nd story (bedroom)     Prior Function   Level of Independence Independent     Cognition   Overall Cognitive Status Within Functional Limits for tasks assessed     ROM / Strength   AROM / PROM / Strength Strength     Strength   Strength Assessment Site Hip;Knee;Ankle   Right/Left Hip Right;Left   Right Hip Flexion 5/5   Right Hip Extension 4+/5   Right Hip ABduction 4+/5   Left Hip Flexion 4+/5   Left Hip  Extension 4+/5   Left Hip ABduction 4/5   Right/Left Knee Right;Left   Right Knee Extension 5/5   Left Knee Extension 5/5   Right/Left Ankle Right;Left   Right Ankle Dorsiflexion 5/5   Left Ankle Dorsiflexion 5/5     Palpation   Palpation comment tenderness with palpation along lumbar paraspinals L1-L2 region (Rt)     Transfers   Five time sit to stand comments  6.8 sec, no UE support            Objective measurements completed on examination: See above findings.          [facility] Adult PT Treatment/Exercise - 04/13/17 0001      Self-Care   Self-Care Lifting   Lifting lifting small box (unweighted), x3 reps with proper tehcnique     Exercises   Exercises Lumbar     Lumbar Exercises: Supine   Clam 10 reps   Clam Limitations blue TB, UE pressdown    Dead Bug 10 reps   Dead Bug Limitations bent knee, heavy cues needed                PT Education - 04/13/17 1538    Education provided Yes   Education Details eval findings/POC implemented and reviewed HEP; briefly reivewed proper lifting technique   Person(s) Educated Patient   Methods Explanation;Verbal cues;Tactile cues;Handout   Comprehension Verbalized understanding;Returned demonstration          PT Short Term Goals - 04/13/17 1550      PT SHORT TERM GOAL #1   Title Pt will demo consistency and independence with HEP to increase LE strength and decrease pain.   Time 1   Period Weeks   Status New           PT Long Term Goals - 04/13/17 1550      PT LONG TERM GOAL #1   Title Pt will demo improved BLE strength to 5/5 MMT which will improve her safety with stair negotiation at home.    Time 4   Period Weeks   Status New     PT LONG TERM GOAL #2   Title Pt will maintain single leg balance on each LE for atleast 15 sec, 2/3 trials without LOB or trendelenburg deviation, to demonstrate improvements in hip and trunk stability.    Time 4   Period Weeks   Status New     PT LONG TERM GOAL #3  Title Pt will report no greater than 3/10 pain with daily activity, to reflect improvements in muscle spasm and increase her activity tolerance.   Time 4   Period Weeks   Status New     PT LONG TERM GOAL #4   Title Pt will be able to lift a 25# box from the floor 3/5 trials with proper technique and without cues from therapist, to improve her safety with lifting her grandson off the floor.    Time 4   Period Weeks   Status New                Plan - 2017/04/30 1541    Clinical Impression Statement Pt is a pleasant 71 y.o F referred to OPPT with acute exacerbation of LBP following a weekend of repetitive lifting her grandson. She sustained a compression fracture back in October of 2017 which has reportedly healed with conservative management. Examination reveals slight limitations in hip strength, decreased trunk/hip stability evident by single leg stance with unsteadiness and (+) Trendelenburg. She is also limited by pain and muscle spasm along the L1/L2 region with prolonged standing or lifting. Pt is trying to maintain her fitness at the gym, however she would benefit from skilled PT for a ~4 weeks to address her pain/muscle spasm and facilitate improvements in trunk/hip stability for safe return to a maintenance program at the gym. Therapist discussed eval findings with the pt and she verbalized understanding of the proposed PT POC and frequency.    History and Personal Factors relevant to plan of care: history of compression fracture; high prior level of activity    Clinical Presentation Stable   Clinical Presentation due to: single leg balance, MMT   Clinical Decision Making Low   Rehab Potential Good   Clinical Impairments Affecting Rehab Potential (+) highly motivated    PT Frequency 2x / week   PT Duration 4 weeks   PT Treatment/Interventions ADLs/Self Care Home Management;Moist Heat;Cryotherapy;Balance training;Therapeutic exercise;Therapeutic activities;Functional mobility  training;Stair training;Gait training;Neuromuscular re-education;Orthotic Fit/Training;Manual techniques;Passive range of motion;Dry needling   PT Next Visit Plan Progress trunk stability exercises (dead bug increased to straight leg, B bent knee raises, etc.), STM along lumbar paraspinals    PT Home Exercise Plan supine bent knee dead bug 2x10 reps each; Supine clamshell with ab set 2x15 reps each (Blue TB)   Consulted and Agree with Plan of Care Patient      Patient will benefit from skilled therapeutic intervention in order to improve the following deficits and impairments:  Abnormal gait, Decreased activity tolerance, Decreased balance, Improper body mechanics, Pain, Increased muscle spasms, Decreased strength, Decreased range of motion  Visit Diagnosis: Low back pain, unspecified back pain laterality, unspecified chronicity, with sciatica presence unspecified  Muscle spasm of back  Muscle weakness (generalized)      G-Codes - 04/30/2017 1554    Functional Assessment Tool Used (Outpatient Only) Clinical judgement based on assessment of strength, functional mobility, single leg balance    Functional Limitation Mobility: Walking and moving around   Mobility: Walking and Moving Around Current Status ([id number]) At least 20 percent but less than 40 percent impaired, limited or restricted   Mobility: Walking and Moving Around Goal Status [phone]) At least 1 percent but less than 20 percent impaired, limited or restricted       Problem List Patient Active Problem List   Diagnosis Date Noted  . Sore throat 10/22/2016  . At high risk for injury related to fall 08/30/2016  .  Vitamin D deficiency 08/07/2016  . Compression fracture of first lumbar vertebra ([place]) 07/15/2016  . FH: CAD (coronary artery disease) 10/31/2014  . Hypothyroidism 02/26/2014  . Osteoporosis 03/26/2010  . CHEST PAIN-PRECORDIAL 01/14/2010  . Dyslipidemia 02/16/2008    3:57 PM,04/13/17 Elly Modena PT, DPT [facility] [place] [address] [place], [place], [address] Phone: [phone]   Fax:  [phone]  Name: Amanda Harmon MRN: [id number] Date of Birth: 1946/08/04

## 2017-04-16 ENCOUNTER — Ambulatory Visit (HOSPITAL_COMMUNITY): Payer: Medicare Other | Admitting: Physical Therapy

## 2017-04-16 DIAGNOSIS — R29898 Other symptoms and signs involving the musculoskeletal system: Secondary | ICD-10-CM

## 2017-04-16 DIAGNOSIS — M6281 Muscle weakness (generalized): Secondary | ICD-10-CM | POA: Diagnosis not present

## 2017-04-16 DIAGNOSIS — M545 Low back pain: Secondary | ICD-10-CM | POA: Diagnosis not present

## 2017-04-16 DIAGNOSIS — M62830 Muscle spasm of back: Secondary | ICD-10-CM

## 2017-04-16 NOTE — Therapy (Signed)
[place], [place], [address] Phone: [phone]   Fax:  [phone]  Physical Therapy Treatment  Patient Details  Name: Amanda Harmon MRN: [id number] Date of Birth: Jan 08, 1946 Referring Provider: Suella Broad, MD   Encounter Date: 04/16/2017      PT End of Session - 04/16/17 1703    Visit Number 2   Number of Visits 9   Date for PT Re-Evaluation 05/11/17   Authorization Type Medicare   Authorization Time Period 04/13/17 to 05/11/17   PT Start Time 1645   PT Stop Time 1728   PT Time Calculation (min) 43 min   Activity Tolerance Patient tolerated treatment well;No increased pain   Behavior During Therapy WFL for tasks assessed/performed      Past Medical History:  Diagnosis Date  . 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 Cystitis   . Hyperlipidemia    borderline   . Mitral valve prolapse 1984  . Thyroid disease 2015   hypothyroidism  . Vertigo     Past Surgical History:  Procedure Laterality Date  . BACK SURGERY  2008  . BREAST BIOPSY  1998   left - benign  . CARPECTOMY HAND Right 06/22/2016  . COLONOSCOPY N/A 01/17/2015   Procedure: COLONOSCOPY;  Surgeon: Rogene Houston, MD;  [facility];  Service: Endoscopy;  Laterality: N/A;  830 - moved to 4/21 @ 8:30  . EYE SURGERY Left 12/01/2013   cataract  . EYE SURGERY Right 12/15/2013   cataract  . IR GENERIC HISTORICAL  08/19/2016   IR RADIOLOGIST EVAL & MGMT 08/19/2016 [facility]  . SPINE SURGERY    . TONSILLECTOMY  1965  . TUBAL LIGATION  1983    There were no vitals filed for this visit.      Subjective Assessment - 04/16/17 1646    Subjective Pt reports that things are going well. She has been working on her exercises except for yesterday. Otherwise no pain currently.    Pertinent History Back surgery 2008, osteopenia/osteoporosis; HLD   Limitations Lifting;Standing   How long can you sit comfortably? unlimited    How long can you stand comfortably? while doing dishes notes tingling    How long can you walk comfortably? unlimited    Diagnostic tests none since the compression fracture   Patient Stated Goals decreased pain and strengthen muscles in the back   Currently in Pain? No/denies   Pain Onset More than a month ago                         [facility] Adult PT Treatment/Exercise - 04/16/17 0001      Exercises   Exercises Other Exercises   Other Exercises  seated trunk rotation with green TB x15 reps each; seated hip flexion on dyna disc 3x10 reps each      Lumbar Exercises: Standing   Other Standing Lumbar Exercises BUE pressdown with red TB, single leg hip flexion x10 reps each, CGA   Other Standing Lumbar Exercises Hip hike/lower from 6" box 2x15 reps each      Lumbar Exercises: Supine   Bent Knee Raise 10 reps   Bent Knee Raise Limitations blue TB around knees and ball squeeze between ankles , x5 reps on 4" box, x10 reps without box    Dead Bug 5 reps   Dead Bug Limitations 1 set with knee bent, x1 set with knee straight  Lumbar Exercises: Prone   Single Arm Raise 15 reps;Left;Right   Single Arm Raises Limitations 2 sec hold, therapist assisting RUE   Straight Leg Raise 15 reps   Opposite Arm/Leg Raise 15 reps;Left arm/Right leg;Right arm/Left leg                PT Education - 04/16/17 1703    Education provided Yes   Education Details technique with therex; altered HEP to reflect improvements    Person(s) Educated Patient   Methods Explanation;Demonstration;Tactile cues;Verbal cues;Handout   Comprehension Returned demonstration;Verbalized understanding          PT Short Term Goals - 04/13/17 1550      PT SHORT TERM GOAL #1   Title Pt will demo consistency and independence with HEP to increase LE strength and decrease pain.   Time 1   Period Weeks   Status New           PT Long Term Goals - 04/13/17 1550      PT LONG TERM GOAL #1   Title  Pt will demo improved BLE strength to 5/5 MMT which will improve her safety with stair negotiation at home.    Time 4   Period Weeks   Status New     PT LONG TERM GOAL #2   Title Pt will maintain single leg balance on each LE for atleast 15 sec, 2/3 trials without LOB or trendelenburg deviation, to demonstrate improvements in hip and trunk stability.    Time 4   Period Weeks   Status New     PT LONG TERM GOAL #3   Title Pt will report no greater than 3/10 pain with daily activity, to reflect improvements in muscle spasm and increase her activity tolerance.   Time 4   Period Weeks   Status New     PT LONG TERM GOAL #4   Title Pt will be able to lift a 25# box from the floor 3/5 trials with proper technique and without cues from therapist, to improve her safety with lifting her grandson off the floor.    Time 4   Period Weeks   Status New               Plan - 04/16/17 1723    Clinical Impression Statement Pt arrived today without report of pain and consistent adherence to her HEP provided at evaluation. Therapist reviewed dead bug exercise and made adjustments to technique to increase the level of difficulty. Also completed therex to further promote trunk control during activity and improve endurance. Pt was able to complete most exercises without significant difficulty, however she did require intermittent verbal cues to increase deep abdominal activation and maintain her stability. Ended session without report pain.    Rehab Potential Good   Clinical Impairments Affecting Rehab Potential (+) highly motivated    PT Frequency 2x / week   PT Duration 4 weeks   PT Treatment/Interventions ADLs/Self Care Home Management;Moist Heat;Cryotherapy;Balance training;Therapeutic exercise;Therapeutic activities;Functional mobility training;Stair training;Gait training;Neuromuscular re-education;Orthotic Fit/Training;Manual techniques;Passive range of motion;Dry needling   PT Next Visit Plan  Progress trunk stability exercises quadruped if able to tolerate position, STM along lumbar paraspinals if needed   PT Home Exercise Plan supine straight leg dead bug 2x10 reps each; prone alt UE/LE reach 2x15 reps each    Consulted and Agree with Plan of Care Patient      Patient will benefit from skilled therapeutic intervention in order to improve the following deficits and  impairments:  Abnormal gait, Decreased activity tolerance, Decreased balance, Improper body mechanics, Pain, Increased muscle spasms, Decreased strength, Decreased range of motion  Visit Diagnosis: Low back pain, unspecified back pain laterality, unspecified chronicity, with sciatica presence unspecified  Muscle spasm of back  Muscle weakness (generalized)  Other symptoms and signs involving the musculoskeletal system     Problem List Patient Active Problem List   Diagnosis Date Noted  . Sore throat 10/22/2016  . At high risk for injury related to fall 08/30/2016  . Vitamin D deficiency 08/07/2016  . Compression fracture of first lumbar vertebra ([place]) 07/15/2016  . FH: CAD (coronary artery disease) 10/31/2014  . Hypothyroidism 02/26/2014  . Osteoporosis 03/26/2010  . CHEST PAIN-PRECORDIAL 01/14/2010  . Dyslipidemia 02/16/2008    5:31 PM,04/16/17 Elly Modena PT, DPT [facility] [place] [address] [place], [place], [address] Phone: [phone]   Fax:  [phone]  Name: Amanda Harmon MRN: [id number] Date of Birth: [birth year]

## 2017-04-19 ENCOUNTER — Encounter: Payer: Self-pay | Admitting: Family Medicine

## 2017-04-19 ENCOUNTER — Ambulatory Visit (INDEPENDENT_AMBULATORY_CARE_PROVIDER_SITE_OTHER): Payer: Medicare Other | Admitting: Family Medicine

## 2017-04-19 ENCOUNTER — Telehealth (HOSPITAL_COMMUNITY): Payer: Self-pay | Admitting: Family Medicine

## 2017-04-19 ENCOUNTER — Other Ambulatory Visit (HOSPITAL_COMMUNITY)
Admission: RE | Admit: 2017-04-19 | Discharge: 2017-04-19 | Disposition: A | Discharge status: Home / Self Care | Payer: Medicare Other | Source: Ambulatory Visit | Attending: Family Medicine | Admitting: Family Medicine

## 2017-04-19 VITALS — BP 138/80 | HR 73 | Resp 16 | Ht 67.0 in | Wt 132.0 lb

## 2017-04-19 DIAGNOSIS — Z01419 Encounter for gynecological examination (general) (routine) without abnormal findings: Secondary | ICD-10-CM

## 2017-04-19 DIAGNOSIS — N760 Acute vaginitis: Secondary | ICD-10-CM

## 2017-04-19 DIAGNOSIS — Z8619 Personal history of other infectious and parasitic diseases: Secondary | ICD-10-CM | POA: Insufficient documentation

## 2017-04-19 DIAGNOSIS — Z1211 Encounter for screening for malignant neoplasm of colon: Secondary | ICD-10-CM | POA: Diagnosis not present

## 2017-04-19 LAB — POC HEMOCCULT BLD/STL (OFFICE/1-CARD/DIAGNOSTIC): FECAL OCCULT BLD: NEGATIVE

## 2017-04-19 NOTE — Patient Instructions (Addendum)
  F/u in mid January, call if you need me sooner  CBC, fasting lipid, chem 7in 5.5 month  Please get new  Shingles vaccine , recommended  Please get mammogram, past due  Call and come for flu vaccine in September,   Continue good exercise and diet program  Thank you  for choosing [facility]. We consider it a privelige to serve you.  Delivering excellent health care in a caring and  compassionate way is our goal.  Partnering with you,  so that together we can achieve this goal is our strategy.

## 2017-04-19 NOTE — Telephone Encounter (Signed)
04/19/17  pt called to cx but no reason was given

## 2017-04-20 ENCOUNTER — Encounter (HOSPITAL_COMMUNITY): Payer: Medicare Other

## 2017-04-21 ENCOUNTER — Encounter (HOSPITAL_COMMUNITY): Payer: Medicare Other

## 2017-04-21 ENCOUNTER — Encounter: Payer: Self-pay | Admitting: Family Medicine

## 2017-04-21 LAB — CERVICOVAGINAL ANCILLARY ONLY: WET PREP (BD AFFIRM): NEGATIVE

## 2017-04-23 ENCOUNTER — Encounter (HOSPITAL_COMMUNITY): Payer: Medicare Other

## 2017-04-26 ENCOUNTER — Encounter (HOSPITAL_COMMUNITY): Payer: Self-pay

## 2017-04-26 ENCOUNTER — Ambulatory Visit (HOSPITAL_COMMUNITY): Payer: Medicare Other

## 2017-04-26 DIAGNOSIS — R29898 Other symptoms and signs involving the musculoskeletal system: Secondary | ICD-10-CM

## 2017-04-26 DIAGNOSIS — M62830 Muscle spasm of back: Secondary | ICD-10-CM | POA: Diagnosis not present

## 2017-04-26 DIAGNOSIS — M545 Low back pain: Secondary | ICD-10-CM | POA: Diagnosis not present

## 2017-04-26 DIAGNOSIS — M6281 Muscle weakness (generalized): Secondary | ICD-10-CM

## 2017-04-26 NOTE — Therapy (Signed)
[place], [place], [address] Phone: [phone]   Fax:  [phone]  Physical Therapy Treatment  Patient Details  Name: Amanda Harmon MRN: [id number] Date of Birth: 18-Sep-1946 Referring Provider: Suella Broad, MD   Encounter Date: 04/26/2017      PT End of Session - 04/26/17 0952    Visit Number 3   Number of Visits 9   Date for PT Re-Evaluation 05/11/17   Authorization Type Medicare   Authorization Time Period 04/13/17 to 05/11/17   PT Start Time 0950   PT Stop Time 1030   PT Time Calculation (min) 40 min   Activity Tolerance Patient tolerated treatment well;No increased pain   Behavior During Therapy WFL for tasks assessed/performed      Past Medical History:  Diagnosis Date  . 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 Cystitis   . Hyperlipidemia    borderline   . Mitral valve prolapse 1984  . Thyroid disease 2015   hypothyroidism  . Vertigo     Past Surgical History:  Procedure Laterality Date  . BACK SURGERY  2008  . BREAST BIOPSY  1998   left - benign  . CARPECTOMY HAND Right 06/22/2016  . COLONOSCOPY N/A 01/17/2015   Procedure: COLONOSCOPY;  Surgeon: Rogene Houston, MD;  [facility];  Service: Endoscopy;  Laterality: N/A;  830 - moved to 4/21 @ 8:30  . EYE SURGERY Left 12/01/2013   cataract  . EYE SURGERY Right 12/15/2013   cataract  . IR GENERIC HISTORICAL  08/19/2016   IR RADIOLOGIST EVAL & MGMT 08/19/2016 [facility]  . SPINE SURGERY    . TONSILLECTOMY  1965  . TUBAL LIGATION  1983    There were no vitals filed for this visit.      Subjective Assessment - 04/26/17 0953    Subjective Pt states that she is hurting a little bit this morning but not too bad. She is concerned she may be overdoing her HEP because it hurts to perform it.    Pertinent History Back surgery 2008, osteopenia/osteoporosis; HLD   Limitations Lifting;Standing   How long can you sit  comfortably? unlimited    How long can you stand comfortably? while doing dishes notes tingling    How long can you walk comfortably? unlimited    Diagnostic tests none since the compression fracture   Patient Stated Goals decreased pain and strengthen muscles in the back   Currently in Pain? Yes   Pain Score 1    Pain Location Back   Pain Orientation Right   Pain Descriptors / Indicators Aching   Pain Type Acute pain   Pain Onset More than a month ago   Pain Frequency Intermittent   Aggravating Factors  standing too long, lifting   Pain Relieving Factors walking, resting   Effect of Pain on Daily Activities minimally limited             [facility] Adult PT Treatment/Exercise - 04/26/17 0001      Lumbar Exercises: Standing   Other Standing Lumbar Exercises BUE pressdown x10 with red TB and x10 with GTB, single leg hip flexion each, CGA   Other Standing Lumbar Exercises bil tandem stance on foam and SLS on foam 3x10 seconds each     Lumbar Exercises: Seated   LAQ on Chair Limitations marching on physioball x 10 reps each, bil single leg balance on physioball with palov press and trunk  rotations with RTB 2 x 10 reps each   Hip Flexion on Ball Both;10 reps   Hip Flexion on Ball Limitations 2 sets; on dynadisc   Sit to Stand Limitations trunk rotations while sitting on dynadisc with uni march 2 x 10 sets each with GTB     Lumbar Exercises: Supine   Dead Bug 10 reps   Dead Bug Limitations reviewing HEP for form; uni deadbugs with physioball 2x10 reps each     Lumbar Exercises: Prone   Opposite Arm/Leg Raise Right arm/Left leg;Left arm/Right leg;10 reps   Opposite Arm/Leg Raise Limitations review for HEP   Other Prone Lumbar Exercises birddogs 2x10 reps each               PT Education - 04/26/17 1030    Education provided Yes   Education Details reviewed form for current HEP and cued pt to perform ab set with deadbugs; continue current HEP   Person(s) Educated Patient    Methods Explanation;Demonstration   Comprehension Verbalized understanding;Returned demonstration          PT Short Term Goals - 04/13/17 1550      PT SHORT TERM GOAL #1   Title Pt will demo consistency and independence with HEP to increase LE strength and decrease pain.   Time 1   Period Weeks   Status New           PT Long Term Goals - 04/13/17 1550      PT LONG TERM GOAL #1   Title Pt will demo improved BLE strength to 5/5 MMT which will improve her safety with stair negotiation at home.    Time 4   Period Weeks   Status New     PT LONG TERM GOAL #2   Title Pt will maintain single leg balance on each LE for atleast 15 sec, 2/3 trials without LOB or trendelenburg deviation, to demonstrate improvements in hip and trunk stability.    Time 4   Period Weeks   Status New     PT LONG TERM GOAL #3   Title Pt will report no greater than 3/10 pain with daily activity, to reflect improvements in muscle spasm and increase her activity tolerance.   Time 4   Period Weeks   Status New     PT LONG TERM GOAL #4   Title Pt will be able to lift a 25# box from the floor 3/5 trials with proper technique and without cues from therapist, to improve her safety with lifting her grandson off the floor.    Time 4   Period Weeks   Status New               Plan - 04/26/17 1031    Clinical Impression Statement Progressed pt's trunk stabilization exercises this date. She required min cues for form throughout but overall did well with them. She did c/o mild LBP with birddogs but cued pt to perform ab set which helped decrease her pain. Continue POC as planned.   Rehab Potential Good   Clinical Impairments Affecting Rehab Potential (+) highly motivated    PT Frequency 2x / week   PT Duration 4 weeks   PT Treatment/Interventions ADLs/Self Care Home Management;Moist Heat;Cryotherapy;Balance training;Therapeutic exercise;Therapeutic activities;Functional mobility training;Stair  training;Gait training;Neuromuscular re-education;Orthotic Fit/Training;Manual techniques;Passive range of motion;Dry needling   PT Next Visit Plan Progress trunk stability exercises quadruped if able to tolerate position, STM along lumbar paraspinals if needed; continue birddogs and progressing core strength  PT Home Exercise Plan supine straight leg dead bug 2x10 reps each; prone alt UE/LE reach 2x15 reps each    Consulted and Agree with Plan of Care Patient      Patient will benefit from skilled therapeutic intervention in order to improve the following deficits and impairments:  Abnormal gait, Decreased activity tolerance, Decreased balance, Improper body mechanics, Pain, Increased muscle spasms, Decreased strength, Decreased range of motion  Visit Diagnosis: Low back pain, unspecified back pain laterality, unspecified chronicity, with sciatica presence unspecified  Muscle spasm of back  Muscle weakness (generalized)  Other symptoms and signs involving the musculoskeletal system     Problem List Patient Active Problem List   Diagnosis Date Noted  . Sore throat 10/22/2016  . At high risk for injury related to fall 08/30/2016  . Vitamin D deficiency 08/07/2016  . Compression fracture of first lumbar vertebra ([place]) 07/15/2016  . FH: CAD (coronary artery disease) 10/31/2014  . Hypothyroidism 02/26/2014  . Osteoporosis 03/26/2010  . CHEST PAIN-PRECORDIAL 01/14/2010  . Dyslipidemia 02/16/2008     Geraldine Solar PT, DPT  [place] [address] [place], [place], [address] Phone: [phone]   Fax:  [phone]  Name: CAYA SOBERANIS MRN: [id number] Date of Birth: 23-Aug-1946

## 2017-04-27 ENCOUNTER — Ambulatory Visit (HOSPITAL_COMMUNITY): Payer: Medicare Other

## 2017-04-27 DIAGNOSIS — R29898 Other symptoms and signs involving the musculoskeletal system: Secondary | ICD-10-CM | POA: Diagnosis not present

## 2017-04-27 DIAGNOSIS — M545 Low back pain: Secondary | ICD-10-CM

## 2017-04-27 DIAGNOSIS — M62830 Muscle spasm of back: Secondary | ICD-10-CM

## 2017-04-27 DIAGNOSIS — M6281 Muscle weakness (generalized): Secondary | ICD-10-CM

## 2017-04-27 NOTE — Therapy (Signed)
[place], [place], [address] Phone: [phone]   Fax:  [phone]  Physical Therapy Treatment  Patient Details  Name: Amanda Harmon MRN: [id number] Date of Birth: Jan 14, 1946 Referring Provider: Suella Broad, MD   Encounter Date: 04/27/2017      PT End of Session - 04/27/17 1307    Visit Number 4   Number of Visits 9   Date for PT Re-Evaluation 05/11/17   Authorization Time Period 04/13/17 to 05/11/17   PT Start Time 1303   PT Stop Time 1345   PT Time Calculation (min) 42 min   Activity Tolerance Patient tolerated treatment well;No increased pain   Behavior During Therapy WFL for tasks assessed/performed      Past Medical History:  Diagnosis Date  . 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 Cystitis   . Hyperlipidemia    borderline   . Mitral valve prolapse 1984  . Thyroid disease 2015   hypothyroidism  . Vertigo     Past Surgical History:  Procedure Laterality Date  . BACK SURGERY  2008  . BREAST BIOPSY  1998   left - benign  . CARPECTOMY HAND Right 06/22/2016  . COLONOSCOPY N/A 01/17/2015   Procedure: COLONOSCOPY;  Surgeon: Rogene Houston, MD;  [facility];  Service: Endoscopy;  Laterality: N/A;  830 - moved to 4/21 @ 8:30  . EYE SURGERY Left 12/01/2013   cataract  . EYE SURGERY Right 12/15/2013   cataract  . IR GENERIC HISTORICAL  08/19/2016   IR RADIOLOGIST EVAL & MGMT 08/19/2016 [facility]  . SPINE SURGERY    . TONSILLECTOMY  1965  . TUBAL LIGATION  1983    There were no vitals filed for this visit.      Subjective Assessment - 04/27/17 1306    Subjective Pt reports she is feeling good today, no reports of pain.  Reports compliance with HEP daily without question.     Pertinent History Back surgery 2008, osteopenia/osteoporosis; HLD   Patient Stated Goals decreased pain and strengthen muscles in the back   Currently in Pain? No/denies                          [facility] Adult PT Treatment/Exercise - 04/27/17 0001      Lumbar Exercises: Standing   Other Standing Lumbar Exercises BLE SLS with BUE pressdown wiht purple TB 10x each;    Other Standing Lumbar Exercises bil tandem stance on foam with RTB rotation 10x each direction; vector stance 3x 5" holds     Lumbar Exercises: Seated   LAQ on Chair Limitations marching on physioball x 10 reps each, bil single leg balance on physioball with palov press and trunk rotations with RTB 2 x 10 reps each   Hip Flexion on Ball Both;10 reps   Hip Flexion on Ball Limitations 2 sets; on ball     Lumbar Exercises: Supine   Dead Bug 10 reps   Dead Bug Limitations reviewing HEP for form; uni deadbugs with physioball 2x10 reps each     Lumbar Exercises: Prone   Opposite Arm/Leg Raise Right arm/Left leg;Left arm/Right leg;10 reps   Opposite Arm/Leg Raise Limitations encouraged to place pillow under hips for pain control with HEP   Other Prone Lumbar Exercises birddogs 2x10 reps each     Lumbar Exercises: Quadruped   Opposite Arm/Leg Raise Right arm/Left leg;Left arm/Right leg;10 reps  PT Education - 04/26/17 1030    Education provided Yes   Education Details reviewed form for current HEP and cued pt to perform ab set with deadbugs; continue current HEP   Person(s) Educated Patient   Methods Explanation;Demonstration   Comprehension Verbalized understanding;Returned demonstration          PT Short Term Goals - 04/13/17 1550      PT SHORT TERM GOAL #1   Title Pt will demo consistency and independence with HEP to increase LE strength and decrease pain.   Time 1   Period Weeks   Status New           PT Long Term Goals - 04/13/17 1550      PT LONG TERM GOAL #1   Title Pt will demo improved BLE strength to 5/5 MMT which will improve her safety with stair negotiation at home.    Time 4   Period Weeks   Status New     PT LONG TERM GOAL  #2   Title Pt will maintain single leg balance on each LE for atleast 15 sec, 2/3 trials without LOB or trendelenburg deviation, to demonstrate improvements in hip and trunk stability.    Time 4   Period Weeks   Status New     PT LONG TERM GOAL #3   Title Pt will report no greater than 3/10 pain with daily activity, to reflect improvements in muscle spasm and increase her activity tolerance.   Time 4   Period Weeks   Status New     PT LONG TERM GOAL #4   Title Pt will be able to lift a 25# box from the floor 3/5 trials with proper technique and without cues from therapist, to improve her safety with lifting her grandson off the floor.    Time 4   Period Weeks   Status New               Plan - 04/27/17 1438    Clinical Impression Statement Continued progress with trunk stabilization and proximal strengthening exercises.  Utilized dowel rod to improve stability with quadruped exercises and added SLS core strengthenig exercises to POC.  Pt able to complete all therex with no reports of pain throuigh session.  Noted instability with task.     Rehab Potential Good   Clinical Impairments Affecting Rehab Potential (+) highly motivated    PT Frequency 2x / week   PT Duration 4 weeks   PT Treatment/Interventions ADLs/Self Care Home Management;Moist Heat;Cryotherapy;Balance training;Therapeutic exercise;Therapeutic activities;Functional mobility training;Stair training;Gait training;Neuromuscular re-education;Orthotic Fit/Training;Manual techniques;Passive range of motion;Dry needling   PT Next Visit Plan Progress trunk stability exercises quadruped if able to tolerate position, STM along lumbar paraspinals if needed; continue birddogs and progressing core strength   PT Home Exercise Plan supine straight leg dead bug 2x10 reps each; prone alt UE/LE reach 2x15 reps each       Patient will benefit from skilled therapeutic intervention in order to improve the following deficits and  impairments:  Abnormal gait, Decreased activity tolerance, Decreased balance, Improper body mechanics, Pain, Increased muscle spasms, Decreased strength, Decreased range of motion  Visit Diagnosis: Low back pain, unspecified back pain laterality, unspecified chronicity, with sciatica presence unspecified  Muscle spasm of back  Muscle weakness (generalized)     Problem List Patient Active Problem List   Diagnosis Date Noted  . Sore throat 10/22/2016  . At high risk for injury related to fall 08/30/2016  . Vitamin D  deficiency 08/07/2016  . Compression fracture of first lumbar vertebra ([place]) 07/15/2016  . FH: CAD (coronary artery disease) 10/31/2014  . Hypothyroidism 02/26/2014  . Osteoporosis 03/26/2010  . CHEST PAIN-PRECORDIAL 01/14/2010  . Dyslipidemia 02/16/2008   Ihor Austin, [place]; [place]  Aldona Lento 04/27/2017, 2:41 PM  [facility] [address] [place], [place], [address] Phone: [phone]   Fax:  [phone]  Name: CEILI BOSHERS MRN: [id number] Date of Birth: [birth year]

## 2017-04-30 ENCOUNTER — Encounter (HOSPITAL_COMMUNITY): Payer: Medicare Other | Admitting: Physical Therapy

## 2017-05-02 DIAGNOSIS — Z01419 Encounter for gynecological examination (general) (routine) without abnormal findings: Secondary | ICD-10-CM | POA: Insufficient documentation

## 2017-05-02 DIAGNOSIS — N760 Acute vaginitis: Secondary | ICD-10-CM | POA: Insufficient documentation

## 2017-05-02 NOTE — Assessment & Plan Note (Signed)
Mildly symptomatic, exam is within normal, wll await results of specimens submitted prior to trreatment

## 2017-05-02 NOTE — Progress Notes (Signed)
    Amanda Harmon     MRN: [id number]      DOB: Mar 09, 1946  HPI: Patient is in for pelvic and breast exam Has c/o increased vaginal d/c wanta this checked, does not douche is sexually active. Recent labs, if available are reviewed. Immunization is reviewed , and  updated if needed.   PE: BP 138/80   Pulse 73   Resp 16   Ht 5\' 7"  (1.702 m)   Wt 132 lb (59.9 kg)   SpO2 97%   BMI 20.67 kg/m   Pleasant  female, alert and oriented x 3, in no cardio-pulmonary distress. Afebrile. HEENT No facial trauma or asymetry. Sinuses non tender.  Extra occullar muscles intact, pupils equally reactive to light. External ears normal, tympanic membranes clear. Oropharynx moist, no exudate. Neck: supple, no adenopathy,JVD or thyromegaly.No bruits.  Chest: Clear to ascultation bilaterally.No crackles or wheezes. Non tender to palpation  Breast: No asymetry,no masses or lumps. No tenderness. No nipple discharge or inversion. No axillary or supraclavicular adenopathy  Cardiovascular system; Heart sounds normal,  S1 and  S2 ,no S3.  No murmur, or thrill. Apical beat not displaced Peripheral pulses normal.  Abdomen: Soft, non tender, no organomegaly or masses. No bruits. Bowel sounds normal. No guarding, tenderness or rebound.  Rectal:  Normal sphincter tone. No rectal mass. Guaiac negative stool.  GU: External genitalia normal female genitalia , normal female distribution of hair. No lesions. Urethral meatus normal in size, no  Prolapse, no lesions visibly  Present. Bladder non tender. Vagina pink and moist , with no visible lesions ,white physiologic appearing discharge present . Adequate pelvic support no  cystocele or rectocele noted Cervix pink and appears healthy, no lesions or ulcerations noted, no discharge noted from os Uterus normal size, no adnexal masses, no cervical motion or adnexal tenderness.   Musculoskeletal exam: Full ROM of spine, hips , shoulders and  knees. No deformity ,swelling or crepitus noted. No muscle wasting or atrophy.   Neurologic: Cranial nerves 2 to 12 intact. Power, tone ,sensation and reflexes normal throughout. No disturbance in gait. No tremor.  Skin: Intact, no ulceration, erythema , scaling or rash noted. Hyperpigmented nevi present on trunkPsych; Normal mood and affect. Judgement and concentration normal   Assessment & Plan:  Well woman exam with routine gynecological exam Exam as documented Rectal exam : no mass ,  FOB negative Breast exam: no mass palpable , no abnormality on exam, needs mammogram , past due , pt to schedule Pelvic exam:  Normal, specimens sent for testing as pt has c/o increased discharge with probable odor Continued attention yo low fat , plant based diet discussed and encouraged as well as daily exercise commitment for health    Vulvovaginitis Mildly symptomatic, exam is within normal, wll await results of specimens submitted prior to trreatment

## 2017-05-02 NOTE — Assessment & Plan Note (Signed)
Exam as documented Rectal exam : no mass ,  FOB negative Breast exam: no mass palpable , no abnormality on exam, needs mammogram , past due , pt to schedule Pelvic exam:  Normal, specimens sent for testing as pt has c/o increased discharge with probable odor Continued attention yo low fat , plant based diet discussed and encouraged as well as daily exercise commitment for health

## 2017-05-04 ENCOUNTER — Ambulatory Visit (HOSPITAL_COMMUNITY): Payer: Medicare Other | Attending: Family Medicine | Admitting: Physical Therapy

## 2017-05-04 DIAGNOSIS — M6281 Muscle weakness (generalized): Secondary | ICD-10-CM

## 2017-05-04 DIAGNOSIS — M545 Low back pain: Secondary | ICD-10-CM

## 2017-05-04 DIAGNOSIS — M62830 Muscle spasm of back: Secondary | ICD-10-CM

## 2017-05-04 DIAGNOSIS — R29898 Other symptoms and signs involving the musculoskeletal system: Secondary | ICD-10-CM

## 2017-05-04 NOTE — Therapy (Signed)
[facility] [place], [place], [address] Phone: [phone]   Fax:  [phone]  Physical Therapy Treatment  Patient Details  Name: Amanda Harmon MRN: [id number] Date of Birth: 06-Aug-1946 Referring Provider: Suella Broad, MD   Encounter Date: 05/04/2017      PT End of Session - 05/04/17 1535    Visit Number 5   Number of Visits 9   Date for PT Re-Evaluation 05/11/17   Authorization Type Medicare   Authorization Time Period 04/13/17 to 05/11/17   PT Start Time 1432   PT Stop Time 7989   PT Time Calculation (min) 42 min   Activity Tolerance Patient tolerated treatment well;No increased pain   Behavior During Therapy WFL for tasks assessed/performed      Past Medical History:  Diagnosis Date  . 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 Cystitis   . Hyperlipidemia    borderline   . Mitral valve prolapse 1984  . Thyroid disease 2015   hypothyroidism  . Vertigo     Past Surgical History:  Procedure Laterality Date  . BACK SURGERY  2008  . BREAST BIOPSY  1998   left - benign  . CARPECTOMY HAND Right 06/22/2016  . COLONOSCOPY N/A 01/17/2015   Procedure: COLONOSCOPY;  Surgeon: Rogene Houston, MD;  [facility];  Service: Endoscopy;  Laterality: N/A;  830 - moved to 4/21 @ 8:30  . EYE SURGERY Left 12/01/2013   cataract  . EYE SURGERY Right 12/15/2013   cataract  . IR GENERIC HISTORICAL  08/19/2016   IR RADIOLOGIST EVAL & MGMT 08/19/2016 [facility]  . SPINE SURGERY    . TONSILLECTOMY  1965  . TUBAL LIGATION  1983    There were no vitals filed for this visit.      Subjective Assessment - 05/04/17 1436    Subjective Pt reports she had a bout of dizziness over the weekend which is improving. She is having some issues with one of her exercises and would like to review this.    Pertinent History Back surgery 2008, osteopenia/osteoporosis; HLD   Patient Stated Goals decreased pain and strengthen  muscles in the back   Currently in Pain? No/denies                         [facility] Adult PT Treatment/Exercise - 05/04/17 0001      Exercises   Other Exercises  Prone trunk lift with BUE pull back, 2x10 reps; quad alt LE/UE reach with horizontal abduction hold x10 reps each      Lumbar Exercises: Standing   Other Standing Lumbar Exercises Single leg stance with lateral band pull and press x15 reps each with green TB, x15 reps with blue TB    Other Standing Lumbar Exercises BLE deadlift with 2, 8# dumbbells x10 reps      Lumbar Exercises: Prone   Opposite Arm/Leg Raise Right arm/Left leg;Left arm/Right leg;10 reps   Opposite Arm/Leg Raise Limitations x2 sets, technique review for home     Lumbar Exercises: Quadruped   Straight Leg Raise 10 reps   Straight Leg Raises Limitations with purple band resistance into extension    Opposite Arm/Leg Raise Left arm/Right leg;Right arm/Left leg;10 reps   Opposite Arm/Leg Raise Limitations --                PT Education - 05/04/17 1535    Education provided Yes  Education Details noted improvements with pt's technique during session; upcoming re-evaluation to determine need for discharge or extension of POC; technique with therex    Person(s) Educated Patient   Methods Explanation;Verbal cues   Comprehension Verbalized understanding;Returned demonstration          PT Short Term Goals - 05/04/17 1454      PT SHORT TERM GOAL #1   Title Pt will demo consistency and independence with HEP to increase LE strength and decrease pain.   Time 1   Period Weeks   Status On-going           PT Long Term Goals - 05/04/17 1454      PT LONG TERM GOAL #1   Title Pt will demo improved BLE strength to 5/5 MMT which will improve her safety with stair negotiation at home.    Time 4   Period Weeks   Status New     PT LONG TERM GOAL #2   Title Pt will maintain single leg balance on each LE for atleast 15 sec, 2/3 trials  without LOB or trendelenburg deviation, to demonstrate improvements in hip and trunk stability.    Time 4   Period Weeks   Status New     PT LONG TERM GOAL #3   Title Pt will report no greater than 3/10 pain with daily activity, to reflect improvements in muscle spasm and increase her activity tolerance.   Baseline much improved with standing but still bothers her   Time 4   Period Weeks   Status New     PT LONG TERM GOAL #4   Title Pt will be able to lift a 25# box from the floor 3/5 trials with proper technique and without cues from therapist, to improve her safety with lifting her grandson off the floor.    Time 4   Period Weeks   Status New               Plan - 05/04/17 1536    Clinical Impression Statement Pt continues to report improvements in her low back pain and overall activity tolerance. She demonstrates improved technique with exercises completed during today's session, requiring intermittent verbal cues to improve mechanics with higher level exercises. Therapist made additions to pt's HEP with good understanding demonstrated. Will continue with current POC.    Rehab Potential Good   Clinical Impairments Affecting Rehab Potential (+) highly motivated    PT Frequency 2x / week   PT Duration 4 weeks   PT Treatment/Interventions ADLs/Self Care Home Management;Moist Heat;Cryotherapy;Balance training;Therapeutic exercise;Therapeutic activities;Functional mobility training;Stair training;Gait training;Neuromuscular re-education;Orthotic Fit/Training;Manual techniques;Passive range of motion;Dry needling   PT Next Visit Plan Progress trunk stability exercises in standing; STM along lumbar paraspinals if needed; address lifting mechanics and strength   PT Home Exercise Plan supine straight leg dead bug 2x10 reps each; prone alt UE/LE reach 2x15 reps each; single leg pavlov press with green TB x15 reps each    Consulted and Agree with Plan of Care Patient      Patient will  benefit from skilled therapeutic intervention in order to improve the following deficits and impairments:  Abnormal gait, Decreased activity tolerance, Decreased balance, Improper body mechanics, Pain, Increased muscle spasms, Decreased strength, Decreased range of motion  Visit Diagnosis: Low back pain, unspecified back pain laterality, unspecified chronicity, with sciatica presence unspecified  Muscle spasm of back  Muscle weakness (generalized)  Other symptoms and signs involving the musculoskeletal system  Problem List Patient Active Problem List   Diagnosis Date Noted  . Well woman exam with routine gynecological exam 05/02/2017  . Vulvovaginitis 05/02/2017  . At high risk for injury related to fall 08/30/2016  . Vitamin D deficiency 08/07/2016  . Compression fracture of first lumbar vertebra ([place]) 07/15/2016  . FH: CAD (coronary artery disease) 10/31/2014  . Hypothyroidism 02/26/2014  . Osteoporosis 03/26/2010  . Dyslipidemia 02/16/2008   3:41 PM,05/04/17 Elly Modena PT, DPT [facility] [place] [address] [place], [place], [address] Phone: [phone]   Fax:  [phone]  Name: Amanda Harmon MRN: [id number] Date of Birth: May 17, 1946

## 2017-05-06 ENCOUNTER — Encounter (HOSPITAL_COMMUNITY): Payer: Medicare Other

## 2017-05-07 ENCOUNTER — Encounter (HOSPITAL_COMMUNITY): Payer: Self-pay

## 2017-05-07 ENCOUNTER — Ambulatory Visit (HOSPITAL_COMMUNITY): Payer: Medicare Other

## 2017-05-07 DIAGNOSIS — R29898 Other symptoms and signs involving the musculoskeletal system: Secondary | ICD-10-CM | POA: Diagnosis not present

## 2017-05-07 DIAGNOSIS — M545 Low back pain: Secondary | ICD-10-CM

## 2017-05-07 DIAGNOSIS — M6281 Muscle weakness (generalized): Secondary | ICD-10-CM

## 2017-05-07 DIAGNOSIS — M62830 Muscle spasm of back: Secondary | ICD-10-CM

## 2017-05-07 NOTE — Therapy (Signed)
[place], [place], [address] Phone: [phone]   Fax:  [phone]  Physical Therapy Treatment  Patient Details  Name: Amanda Harmon MRN: [id number] Date of Birth: 10-24-1945 Referring Provider: Suella Broad, MD   Encounter Date: 05/07/2017      PT End of Session - 05/07/17 1303    Visit Number 6   Number of Visits 9   Date for PT Re-Evaluation 05/11/17   Authorization Type Medicare   Authorization Time Period 04/13/17 to 05/11/17   PT Start Time 1300   Activity Tolerance Patient tolerated treatment well;No increased pain   Behavior During Therapy WFL for tasks assessed/performed      Past Medical History:  Diagnosis Date  . 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 Cystitis   . Hyperlipidemia    borderline   . Mitral valve prolapse 1984  . Thyroid disease 2015   hypothyroidism  . Vertigo     Past Surgical History:  Procedure Laterality Date  . BACK SURGERY  2008  . BREAST BIOPSY  1998   left - benign  . CARPECTOMY HAND Right 06/22/2016  . COLONOSCOPY N/A 01/17/2015   Procedure: COLONOSCOPY;  Surgeon: Rogene Houston, MD;  [facility];  Service: Endoscopy;  Laterality: N/A;  830 - moved to 4/21 @ 8:30  . EYE SURGERY Left 12/01/2013   cataract  . EYE SURGERY Right 12/15/2013   cataract  . IR GENERIC HISTORICAL  08/19/2016   IR RADIOLOGIST EVAL & MGMT 08/19/2016 [facility]  . SPINE SURGERY    . TONSILLECTOMY  1965  . TUBAL LIGATION  1983    There were no vitals filed for this visit.      Subjective Assessment - 05/07/17 1302    Subjective Pt states that her lower back feels like it is catching today. Her granddkids are in town and she has been on her feet a lot. She rates her pain as 4/10.   Pertinent History Back surgery 2008, osteopenia/osteoporosis; HLD   Patient Stated Goals decreased pain and strengthen muscles in the back   Currently in Pain? Yes   Pain Score 4     Pain Location Back   Pain Orientation Right;Lower   Pain Descriptors / Indicators Spasm   Pain Type Acute pain   Pain Onset More than a month ago   Pain Frequency Intermittent   Aggravating Factors  standing too long, lifting   Pain Relieving Factors walking, resting   Effect of Pain on Daily Activities minimally limited              [facility] Adult PT Treatment/Exercise - 05/07/17 0001      Lumbar Exercises: Stretches   Single Knee to Chest Stretch 2 reps;30 seconds   Single Knee to Chest Stretch Limitations RLE only   Prone Mid Back Stretch Limitations child's pose with L trunk lean 3x30 sec      Lumbar Exercises: Standing   Lifting From floor   Lifting Weights (lbs) 18   Lifting Limitations 2 sets   Other Standing Lumbar Exercises bil SLS on foam and palov press with RTB x15 reps each; bil half kneeling and lifts with GTB x15 reps each with back foot not touching the ground   Other Standing Lumbar Exercises BLE deadlift with 2, 8# dumbbells x10 reps; bil SLS on foam and pulldowns with GTB x15 reps each; bil SLS on foam 3x15" each and SLS on foam  with vectors 3x2-3" holds each     Lumbar Exercises: Supine   Bridge Compliant;10 reps   Bridge Limitations 2 sets on physioball     Lumbar Exercises: Prone   Other Prone Lumbar Exercises rollouts on physioball from knees 2 sets x 10 reps     Lumbar Exercises: Quadruped   Opposite Arm/Leg Raise Right arm/Left leg;Left arm/Right leg;10 reps   Opposite Arm/Leg Raise Limitations with dynadics                  PT Short Term Goals - 05/04/17 1454      PT SHORT TERM GOAL #1   Title Pt will demo consistency and independence with HEP to increase LE strength and decrease pain.   Time 1   Period Weeks   Status On-going           PT Long Term Goals - 05/04/17 1454      PT LONG TERM GOAL #1   Title Pt will demo improved BLE strength to 5/5 MMT which will improve her safety with stair negotiation at home.    Time 4    Period Weeks   Status New     PT LONG TERM GOAL #2   Title Pt will maintain single leg balance on each LE for atleast 15 sec, 2/3 trials without LOB or trendelenburg deviation, to demonstrate improvements in hip and trunk stability.    Time 4   Period Weeks   Status New     PT LONG TERM GOAL #3   Title Pt will report no greater than 3/10 pain with daily activity, to reflect improvements in muscle spasm and increase her activity tolerance.   Baseline much improved with standing but still bothers her   Time 4   Period Weeks   Status New     PT LONG TERM GOAL #4   Title Pt will be able to lift a 25# box from the floor 3/5 trials with proper technique and without cues from therapist, to improve her safety with lifting her grandson off the floor.    Time 4   Period Weeks   Status New               Plan - 05/07/17 1341    Clinical Impression Statement Pt presented to therapy with slight increase in LBP this date after having spent a lot of time with her grandkids over the last few days. Had pt perform child's pose and SKTC to address her complaints and she verbalized that it helped decrease her pain. Rest of session focused on core BLE strengthening. Assessed lifting this date and pt required min cues for proper technique, but overall she had good form and reported no LBP. Pt due for reassessment at her next scheduled appointment.   Rehab Potential Good   Clinical Impairments Affecting Rehab Potential (+) highly motivated    PT Frequency 2x / week   PT Duration 4 weeks   PT Treatment/Interventions ADLs/Self Care Home Management;Moist Heat;Cryotherapy;Balance training;Therapeutic exercise;Therapeutic activities;Functional mobility training;Stair training;Gait training;Neuromuscular re-education;Orthotic Fit/Training;Manual techniques;Passive range of motion;Dry needling   PT Next Visit Plan reassess   PT Home Exercise Plan supine straight leg dead bug 2x10 reps each; prone alt  UE/LE reach 2x15 reps each; single leg pavlov press with green TB x15 reps each    Consulted and Agree with Plan of Care Patient      Patient will benefit from skilled therapeutic intervention in order to improve the following deficits and  impairments:  Abnormal gait, Decreased activity tolerance, Decreased balance, Improper body mechanics, Pain, Increased muscle spasms, Decreased strength, Decreased range of motion  Visit Diagnosis: Low back pain, unspecified back pain laterality, unspecified chronicity, with sciatica presence unspecified  Muscle spasm of back  Muscle weakness (generalized)  Other symptoms and signs involving the musculoskeletal system     Problem List Patient Active Problem List   Diagnosis Date Noted  . Well woman exam with routine gynecological exam 05/02/2017  . Vulvovaginitis 05/02/2017  . At high risk for injury related to fall 08/30/2016  . Vitamin D deficiency 08/07/2016  . Compression fracture of first lumbar vertebra ([place]) 07/15/2016  . FH: CAD (coronary artery disease) 10/31/2014  . Hypothyroidism 02/26/2014  . Osteoporosis 03/26/2010  . Dyslipidemia 02/16/2008     Geraldine Solar PT, DPT  [facility] [address] [place], [place], [address] Phone: [phone]   Fax:  [phone]  Name: Amanda Harmon MRN: [id number] Date of Birth: 04/01/1946

## 2017-05-10 ENCOUNTER — Ambulatory Visit (HOSPITAL_COMMUNITY): Payer: Medicare Other | Admitting: Physical Therapy

## 2017-05-10 ENCOUNTER — Ambulatory Visit (HOSPITAL_COMMUNITY)
Admission: RE | Admit: 2017-05-10 | Discharge: 2017-05-10 | Disposition: A | Discharge status: Home / Self Care | Payer: Medicare Other | Source: Ambulatory Visit | Attending: Family Medicine | Admitting: Family Medicine

## 2017-05-10 ENCOUNTER — Encounter (HOSPITAL_COMMUNITY): Payer: Medicare Other | Admitting: Physical Therapy

## 2017-05-10 DIAGNOSIS — R29898 Other symptoms and signs involving the musculoskeletal system: Secondary | ICD-10-CM | POA: Diagnosis not present

## 2017-05-10 DIAGNOSIS — M62830 Muscle spasm of back: Secondary | ICD-10-CM | POA: Diagnosis not present

## 2017-05-10 DIAGNOSIS — M6281 Muscle weakness (generalized): Secondary | ICD-10-CM

## 2017-05-10 DIAGNOSIS — M545 Low back pain: Secondary | ICD-10-CM

## 2017-05-10 DIAGNOSIS — Z1231 Encounter for screening mammogram for malignant neoplasm of breast: Secondary | ICD-10-CM | POA: Diagnosis not present

## 2017-05-10 DIAGNOSIS — Z1239 Encounter for other screening for malignant neoplasm of breast: Secondary | ICD-10-CM

## 2017-05-10 NOTE — Therapy (Signed)
[place], [place], [address] Phone: [phone]   Fax:  [phone]  Physical Therapy Treatment/Discharge  Patient Details  Name: Amanda Harmon MRN: [id number] Date of Birth: 1945/10/29 Referring Provider: Suella Broad, MD   Encounter Date: 05/10/2017      PT End of Session - 05/10/17 1632    Visit Number 7   Number of Visits 9   Date for PT Re-Evaluation 05/11/17   Authorization Type Medicare   Authorization Time Period 04/13/17 to 05/11/17   PT Start Time 1555   PT Stop Time 1631   PT Time Calculation (min) 36 min   Activity Tolerance Patient tolerated treatment well;No increased pain   Behavior During Therapy WFL for tasks assessed/performed      Past Medical History:  Diagnosis Date  . 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 Cystitis   . Hyperlipidemia    borderline   . Mitral valve prolapse 1984  . Thyroid disease 2015   hypothyroidism  . Vertigo     Past Surgical History:  Procedure Laterality Date  . BACK SURGERY  2008  . BREAST BIOPSY  1998   left - benign  . CARPECTOMY HAND Right 06/22/2016  . COLONOSCOPY N/A 01/17/2015   Procedure: COLONOSCOPY;  Surgeon: Rogene Houston, MD;  [facility];  Service: Endoscopy;  Laterality: N/A;  830 - moved to 4/21 @ 8:30  . EYE SURGERY Left 12/01/2013   cataract  . EYE SURGERY Right 12/15/2013   cataract  . IR GENERIC HISTORICAL  08/19/2016   IR RADIOLOGIST EVAL & MGMT 08/19/2016 [facility]  . SPINE SURGERY    . TONSILLECTOMY  1965  . TUBAL LIGATION  1983    There were no vitals filed for this visit.      Subjective Assessment - 05/10/17 1556    Subjective Pt reports that she feels almost 80% improved since the start of PT. She has been having minimal issues with low back pain on days where she is very busy. She continues to complete her exercises at home.     Pertinent History Back surgery 2008, osteopenia/osteoporosis;  HLD   How long can you sit comfortably? unlimited    Patient Stated Goals decreased pain and strengthen muscles in the back   Currently in Pain? No/denies   Pain Onset More than a month ago            [facility] PT Assessment - 05/10/17 0001      Assessment   Medical Diagnosis Degenerative lumbar disc    Referring Provider Suella Broad, MD    Onset Date/Surgical Date --  June, acute on chronic exacerbation   Next MD Visit none for this    Prior Therapy none      Balance Screen   Has the patient fallen in the past 6 months No   Has the patient had a decrease in activity level because of a fear of falling?  No   Is the patient reluctant to leave their home because of a fear of falling?  No     Home Environment   Living Environment Private residence   Additional Comments stairs up to the 2nd story (bedroom)     Prior Function   Level of Independence Independent     Cognition   Overall Cognitive Status Within Functional Limits for tasks assessed     Strength   Right Hip Flexion 5/5   Right  Hip Extension 5/5   Right Hip ABduction 5/5   Left Hip Flexion 5/5   Left Hip Extension 5/5   Left Hip ABduction 5/5   Right Knee Extension 5/5   Left Knee Extension 5/5   Right Ankle Dorsiflexion 5/5   Left Ankle Dorsiflexion 5/5     Palpation   Palpation comment non tender with palpation along lumbar paraspinal region, no muscle spasm noted      Transfers   Five time sit to stand comments  6.8 sec, no UE support                     [facility] Adult PT Treatment/Exercise - 05/10/17 0001      Lumbar Exercises: Supine   Dead Bug 10 reps   Dead Bug Limitations HEP demo, LE straight    Bridge 5 reps   Bridge Limitations hold with alt knee extension, HEP demo    Other Supine Lumbar Exercises low trunk rotation with LE at 90/90 on physioball x5 reps each for HEP demo      Lumbar Exercises: Quadruped   Opposite Arm/Leg Raise 5 reps;Left arm/Right leg;Right arm/Left leg    Opposite Arm/Leg Raise Limitations HEP review                PT Education - 05/10/17 1639    Education provided Yes   Education Details reviewed mechanics with lifting and other techniques to use when lifting something from the floor; reviewed goals and updated/reviewed HEP; discussed recommended frequency of HEP completion to maintain current level of fitness.   Person(s) Educated Patient   Methods Explanation;Verbal cues;Handout   Comprehension Verbalized understanding;Returned demonstration          PT Short Term Goals - 05/10/17 1602      PT SHORT TERM GOAL #1   Title Pt will demo consistency and independence with HEP to increase LE strength and decrease pain.   Time 1   Period Weeks   Status Achieved           PT Long Term Goals - 05/10/17 1603      PT LONG TERM GOAL #1   Title Pt will demo improved BLE strength to 5/5 MMT which will improve her safety with stair negotiation at home.    Time 4   Period Weeks   Status Achieved     PT LONG TERM GOAL #2   Title Pt will maintain single leg balance on each LE for atleast 15 sec, 2/3 trials without LOB or trendelenburg deviation, to demonstrate improvements in hip and trunk stability.    Baseline 30 sec each   Time 4   Period Weeks   Status Achieved     PT LONG TERM GOAL #3   Title Pt will report no greater than 3/10 pain with daily activity, to reflect improvements in muscle spasm and increase her activity tolerance.   Baseline much improved, and not nearly as intense as it was prior   Time 4   Period Weeks   Status Achieved     PT LONG TERM GOAL #4   Title Pt will be able to lift a 25# box from the floor 3/5 trials with proper technique and without cues from therapist, to improve her safety with lifting her grandson off the floor.    Time 4   Period Weeks   Status Achieved               Plan - 05/10/17 1610  Clinical Impression Statement Pt was discharged this visit having made excellent  progress, meeting all of her established goals, since beginning PT several weeks ago. She demonstrates full BLE strength, she is non-tender with palpation across her low back, and she is completing daily activities with minimal complaints of pain. Pt has been completing her HEP regularly at home, and has been able to perform higher level stability exercises more recently during her sessions with significantly improved technique. She also demonstrates proper technique with lifting up to 25#, and feels she is about 80% improved. Session focused on reviewing goals and updating/reviewing pt's advanced HEP. She demonstrated good understanding and is agreeable with discharge at this time to allow continued HEP on her own.   Rehab Potential Good   Clinical Impairments Affecting Rehab Potential (+) highly motivated    PT Frequency 2x / week   PT Duration 4 weeks   PT Treatment/Interventions ADLs/Self Care Home Management;Moist Heat;Cryotherapy;Balance training;Therapeutic exercise;Therapeutic activities;Functional mobility training;Stair training;Gait training;Neuromuscular re-education;Orthotic Fit/Training;Manual techniques;Passive range of motion;Dry needling   PT Next Visit Plan d/c home with HEP   PT Home Exercise Plan supine straight leg dead bug 2x10 reps each; prone alt UE/LE reach 2x15 reps each; single leg pavlov press with green TB x15 reps each, DKTC stretch, low trunk rotation with LE on physioball      Consulted and Agree with Plan of Care Patient      Patient will benefit from skilled therapeutic intervention in order to improve the following deficits and impairments:  Abnormal gait, Decreased activity tolerance, Decreased balance, Improper body mechanics, Pain, Increased muscle spasms, Decreased strength, Decreased range of motion  Visit Diagnosis: Low back pain, unspecified back pain laterality, unspecified chronicity, with sciatica presence unspecified  Muscle spasm of back  Muscle  weakness (generalized)  Other symptoms and signs involving the musculoskeletal system       G-Codes - 15-May-2017 1632    Functional Assessment Tool Used (Outpatient Only) Clinical judgement based on assessment of strength, functional mobility, single leg balance    Functional Limitation Mobility: Walking and moving around   Mobility: Walking and Moving Around Goal Status [phone]) At least 1 percent but less than 20 percent impaired, limited or restricted   Mobility: Walking and Moving Around Discharge Status [id number]) At least 1 percent but less than 20 percent impaired, limited or restricted      Problem List Patient Active Problem List   Diagnosis Date Noted  . Well woman exam with routine gynecological exam 05/02/2017  . Vulvovaginitis 05/02/2017  . At high risk for injury related to fall 08/30/2016  . Vitamin D deficiency 08/07/2016  . Compression fracture of first lumbar vertebra ([place]) 07/15/2016  . FH: CAD (coronary artery disease) 10/31/2014  . Hypothyroidism 02/26/2014  . Osteoporosis 03/26/2010  . Dyslipidemia 02/16/2008    PHYSICAL THERAPY DISCHARGE SUMMARY  Visits from Start of Care: 7  Current functional level related to goals / functional outcomes: See above for more details    Remaining deficits: See above for more details    Education / Equipment: See above for more details  Plan: Patient agrees to discharge.  Patient goals were met. Patient is being discharged due to meeting the stated rehab goals.  ?????       4:41 PM,05/15/17 Elly Modena PT, DPT [facility] [place] [address] [place], [place], [address] Phone: [phone]   Fax:  [phone]  Name: Amanda Harmon MRN:  [id number] Date of Birth: 1946/08/07

## 2017-05-13 ENCOUNTER — Ambulatory Visit (HOSPITAL_COMMUNITY): Payer: Medicare Other

## 2017-05-14 ENCOUNTER — Encounter (HOSPITAL_COMMUNITY): Payer: Medicare Other | Admitting: Physical Therapy

## 2017-05-17 ENCOUNTER — Ambulatory Visit (HOSPITAL_COMMUNITY): Payer: Medicare Other | Admitting: Physical Therapy

## 2017-05-18 ENCOUNTER — Encounter (HOSPITAL_COMMUNITY): Payer: Medicare Other | Admitting: Physical Therapy

## 2017-05-20 ENCOUNTER — Encounter (HOSPITAL_COMMUNITY): Payer: Medicare Other | Admitting: Physical Therapy

## 2017-05-21 ENCOUNTER — Encounter (HOSPITAL_COMMUNITY): Payer: Medicare Other | Admitting: Physical Therapy

## 2017-05-24 ENCOUNTER — Encounter (HOSPITAL_COMMUNITY): Payer: Medicare Other | Admitting: Physical Therapy

## 2017-05-25 DIAGNOSIS — Q828 Other specified congenital malformations of skin: Secondary | ICD-10-CM | POA: Diagnosis not present

## 2017-05-27 ENCOUNTER — Encounter (HOSPITAL_COMMUNITY): Payer: Medicare Other | Admitting: Physical Therapy

## 2017-07-23 DIAGNOSIS — Z23 Encounter for immunization: Secondary | ICD-10-CM | POA: Diagnosis not present

## 2017-08-16 ENCOUNTER — Other Ambulatory Visit: Payer: Self-pay | Admitting: "Endocrinology

## 2017-08-16 DIAGNOSIS — E039 Hypothyroidism, unspecified: Secondary | ICD-10-CM

## 2017-08-17 ENCOUNTER — Ambulatory Visit: Payer: Medicare Other | Admitting: "Endocrinology

## 2017-08-17 DIAGNOSIS — E039 Hypothyroidism, unspecified: Secondary | ICD-10-CM | POA: Diagnosis not present

## 2017-08-17 LAB — TSH: TSH: 2.21 m[IU]/L (ref 0.40–4.50)

## 2017-08-17 LAB — T4, FREE: FREE T4: 1.2 ng/dL (ref 0.8–1.8)

## 2017-08-23 DIAGNOSIS — Z23 Encounter for immunization: Secondary | ICD-10-CM | POA: Diagnosis not present

## 2017-08-24 ENCOUNTER — Ambulatory Visit (INDEPENDENT_AMBULATORY_CARE_PROVIDER_SITE_OTHER): Payer: Medicare Other | Admitting: "Endocrinology

## 2017-08-24 ENCOUNTER — Other Ambulatory Visit: Payer: Self-pay | Admitting: "Endocrinology

## 2017-08-24 ENCOUNTER — Encounter: Payer: Self-pay | Admitting: "Endocrinology

## 2017-08-24 VITALS — BP 135/77 | HR 67 | Ht 65.0 in | Wt 134.0 lb

## 2017-08-24 DIAGNOSIS — M8580 Other specified disorders of bone density and structure, unspecified site: Secondary | ICD-10-CM

## 2017-08-24 DIAGNOSIS — E038 Other specified hypothyroidism: Secondary | ICD-10-CM

## 2017-08-24 DIAGNOSIS — M8000XG Age-related osteoporosis with current pathological fracture, unspecified site, subsequent encounter for fracture with delayed healing: Secondary | ICD-10-CM

## 2017-08-24 MED ORDER — ALENDRONATE SODIUM 70 MG PO TABS: 70.0000 mg | ORAL_TABLET | ORAL | 3 refills | Status: DC

## 2017-08-24 MED ORDER — LEVOTHYROXINE SODIUM 50 MCG PO TABS: 50.0000 ug | ORAL_TABLET | Freq: Every morning | ORAL | 3 refills | Status: DC

## 2017-08-24 NOTE — Progress Notes (Signed)
Subjective:    Patient ID: Amanda Harmon, female    DOB: September 26, 1946, PCP Fayrene Helper, MD   Past Medical History:  Diagnosis Date  . 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 Cystitis   . Hyperlipidemia    borderline   . Mitral valve prolapse 1984  . Thyroid disease 2015   hypothyroidism  . Vertigo    Past Surgical History:  Procedure Laterality Date  . BACK SURGERY  2008  . BREAST BIOPSY  1998   left - benign  . CARPECTOMY HAND Right 06/22/2016  . COLONOSCOPY N/A 01/17/2015   Procedure: COLONOSCOPY;  Surgeon: Rogene Houston, MD;  [facility];  Service: Endoscopy;  Laterality: N/A;  830 - moved to 4/21 @ 8:30  . EYE SURGERY Left 12/01/2013   cataract  . EYE SURGERY Right 12/15/2013   cataract  . IR GENERIC HISTORICAL  08/19/2016   IR RADIOLOGIST EVAL & MGMT 08/19/2016 [facility]  . SPINE SURGERY    . TONSILLECTOMY  1965  . TUBAL LIGATION  1983   Social History   Socioeconomic History  . Marital status: Married    Spouse name: None  . Number of children: 3  . Years of education: None  . Highest education level: None  Social Needs  . Financial resource strain: None  . Food insecurity - worry: None  . Food insecurity - inability: None  . Transportation needs - medical: None  . Transportation needs - non-medical: None  Occupational History  . Occupation: Arts development officer: RETIRED  Tobacco Use  . Smoking status: Former Smoker    Packs/day: 1.00    Years: 4.00    Pack years: 4.00    Last attempt to quit: 12/15/1968    Years since quitting: 48.7  . Smokeless tobacco: Never Used  Substance and Sexual Activity  . Alcohol use: Yes    Comment: socially   . Drug use: No  . Sexual activity: Yes  Other Topics Concern  . None  Social History Narrative  . None   Outpatient Encounter Medications as of 08/24/2017  Medication Sig  . alendronate (FOSAMAX) 70 MG tablet Take 1 tablet (70 mg total) by mouth once a  week. Take with a full glass of water on an empty stomach.  . Ascorbic Acid (VITAMIN C) 1000 MG tablet Take 1,000 mg by mouth daily.  . Calcium Carbonate-Vit D-Min (CALCIUM 1200 PO) Take 1 tablet by mouth daily.  . Cholecalciferol (VITAMIN D3) 5000 units CAPS Take 1 capsule (5,000 Units total) by mouth daily.  Marland Kitchen ketotifen (ZADITOR) 0.025 % ophthalmic solution Place 1 drop into both eyes every morning.  Marland Kitchen levothyroxine (SYNTHROID, LEVOTHROID) 50 MCG tablet Take 1 tablet (50 mcg total) by mouth every morning.  . [DISCONTINUED] alendronate (FOSAMAX) 70 MG tablet Take 1 tablet (70 mg total) by mouth once a week. Take with a full glass of water on an empty stomach.  . [DISCONTINUED] levothyroxine (SYNTHROID, LEVOTHROID) 50 MCG tablet Take 1 tablet (50 mcg total) by mouth every morning.   No facility-administered encounter medications on file as of 08/24/2017.    ALLERGIES: Allergies  Allergen Reactions  . Aspirin Other (See Comments)    Bruising  . Poison Oak Extract [Poison Oak Extract] Rash   VACCINATION STATUS: Immunization History  Administered Date(s) Administered  . Influenza Split 06/17/2014, 07/23/2017  . Influenza,inj,Quad PF,6+ Mos 07/20/2013, 07/01/2015, 07/15/2016  . Pneumococcal Conjugate-13 10/30/2014  . Pneumococcal  Polysaccharide-23 03/23/2012  . Tdap 12/09/2012  . Zoster 02/24/2007    HPI  71 yr old female with medical hx as above. She is here to f/u  For her hypothyroidism and osteoporosis with repeat TFTs. For hypothyroidism, she was put on levothyroxine 50 mcg po qam, she is tolerating well. - she remains on alendronate 70 mg by mouth weekly by Dr. Moshe Cipro her primary medical doctor. She continues to follow with her orthopedist in [place],  her after x-ray showed an old hand fracture. No interval problems, she is allowed to use her hands , including for exercise. - Based on her repeat bone density , on 08/15/2016 she was regaining significant (9.5%) bone mass on  the spine, now in the osteopenia range reversing from osteoporosis.  Her prior DXA on 06/13/2014 shows T score -2.2 on L3 ( L4 has -3.1 , but excluded due to advanced DJD). - She has history of compression fracture of first lumbar vertebra. No recent height change. - Dual  Femur analysis showed non significant change since 2015 and still  considered osteopenic with a T score of -1.7 .   She denies hx of goiter. Her last menstrual period was at age 34. she has 3 grown children. she smoked for 4 years in college. she never took steroids on chronic basis. she denies cold/heat intolerance. she is active , exercises regularly.  Last year, while walking,  she fell and sustained non displaced jaw fracture, recent x-ray on her right wrist showed old hand  fracture .    Review of Systems Constitutional:  + Steady weight since last visit- a good development for her, no fatigue, no subjective hyperthermia/hypothermia Eyes: no blurry vision, no xerophthalmia ENT: no sore throat, no nodules palpated in throat, no dysphagia/odynophagia, no hoarseness Cardiovascular: no CP/SOB/palpitations/leg swelling Respiratory: no cough/SOB Gastrointestinal: no N/V/D/C Musculoskeletal: no muscle/joint aches, her right wrist is out of  a cast. Skin: no rashes Neurological: no tremors/numbness/tingling/dizziness Psychiatric: no depression/anxiety  Objective:    BP 135/77   Pulse 67   Ht 5\' 5"  (1.651 m)   Wt 134 lb (60.8 kg)   BMI 22.30 kg/m   Wt Readings from Last 3 Encounters:  08/24/17 134 lb (60.8 kg)  04/19/17 132 lb (59.9 kg)  02/12/17 135 lb (61.2 kg)    Physical Exam  Constitutional: overweight, in NAD Eyes: PERRLA, EOMI, no exophthalmos ENT: moist mucous membranes, no thyromegaly, no cervical lymphadenopathy Cardiovascular: RRR, No MRG Respiratory: CTA B Gastrointestinal: abdomen soft, NT, ND, BS+ Musculoskeletal: no deformities, strength intact in all 4 Skin: moist, warm, no  rashes Neurological: no tremor with outstretched hands, DTR normal in all 4  CMP     Component Value Date/Time   NA 144 12/08/2016 0826   K 4.2 12/08/2016 0826   CL 106 12/08/2016 0826   CO2 32 (H) 12/08/2016 0826   GLUCOSE 84 12/08/2016 0826   BUN 13 12/08/2016 0826   CREATININE 0.54 (L) 12/08/2016 0826   CALCIUM 9.2 12/08/2016 0826   PROT 6.1 12/08/2016 0826   ALBUMIN 4.0 12/08/2016 0826   AST 14 12/08/2016 0826   ALT 10 12/08/2016 0826   ALKPHOS 59 12/08/2016 0826   BILITOT 0.6 12/08/2016 0826   GFRNONAA >60 02/20/2015 2350   GFRAA >60 02/20/2015 2350   Lipid Panel     Component Value Date/Time   CHOL 206 (H) 12/08/2016 0829   TRIG 121 12/08/2016 0829   HDL 66 12/08/2016 0829   CHOLHDL 3.1 12/08/2016 0829  VLDL 24 12/08/2016 0829   LDLCALC 116 (H) 12/08/2016 0829   Results for PAYAL, STANFORTH (MRN [id number]) as of 08/24/2017 11:40  Ref. Range 12/08/2016 08:26 08/17/2017 08:07  TSH Latest Ref Range: 0.40 - 4.50 mIU/L 1.98 2.21  T4,Free(Direct) Latest Ref Range: 0.8 - 1.8 ng/dL 1.2 1.2    Assessment & Plan:   1.  hypothyroidism Her thyroid function tests  are consistent with  appropriate replacement. I will continue levothyroxine  50 mcg po qam.  - We discussed about correct intake of levothyroxine, at fasting, with water, separated by at least 30 minutes from breakfast, and separated by more than 4 hours from calcium, iron, multivitamins, acid reflux medications (PPIs). -Patient is made aware of the fact that thyroid hormone replacement is needed for life, dose to be adjusted by periodic monitoring of thyroid function tests.  2. Osteoporosis  - she is tolerating alendronate 70 mg by mouth weekly.  - Based on her most recent bone density, she has  regained some bone mass on the spine compared to prior study,  and no change on  Dual Femur BMD analysis. - This is better than expected outcome of therapy with alendronate. -  I encouraged her to stay on  alendronate  70 mg weekly for now. She has a propensity for falling ( no recent episodes), and  I advised her on precautions to avoid falling including walking on flat surface or treadmill instead of on outside pavements, and pick up  Strengthening exercises for back muscles. - She is status post therapy with vitamin D 50,000 units weekly for 6 months, her last vitamin D level was 54. - I advised her to continue vitamin D3 5,000 units daily until next visit in 6 months. -She will continue with calcium carbonate 1200 mg daily with lunch.  - She will have repeat bone density in November 2019. -If her BMD response is not satisfactory, Or if she develops interim fracture, she will be considered for Prolia.  3. Hyperlipidemia:  - She has family history of hyperlipidemia and coronary artery disease. Her LDL is  116 slightly improving from 132 along with HDL at 66. She would like to avoid therapy for now. Her LDL was 138 in January 2016 .  - I advised patient to maintain close follow up with Fayrene Helper, MD for primary care needs. Follow up plan: Return in about 1 year (around 08/24/2018) for follow up with pre-visit labs, follow up with Bone Density.  Glade Lloyd, MD Phone: [phone]  Fax: [phone]   08/24/2017, 11:58 AM

## 2017-10-12 ENCOUNTER — Ambulatory Visit: Payer: Medicare Other | Admitting: Family Medicine

## 2017-10-20 ENCOUNTER — Encounter: Payer: Self-pay | Admitting: Family Medicine

## 2017-10-20 ENCOUNTER — Ambulatory Visit (INDEPENDENT_AMBULATORY_CARE_PROVIDER_SITE_OTHER): Payer: Medicare Other | Admitting: Family Medicine

## 2017-10-20 VITALS — BP 136/80 | HR 94 | Resp 16 | Ht 65.0 in | Wt 133.0 lb

## 2017-10-20 DIAGNOSIS — J309 Allergic rhinitis, unspecified: Secondary | ICD-10-CM | POA: Diagnosis not present

## 2017-10-20 DIAGNOSIS — M8000XG Age-related osteoporosis with current pathological fracture, unspecified site, subsequent encounter for fracture with delayed healing: Secondary | ICD-10-CM | POA: Diagnosis not present

## 2017-10-20 DIAGNOSIS — E785 Hyperlipidemia, unspecified: Secondary | ICD-10-CM | POA: Diagnosis not present

## 2017-10-20 DIAGNOSIS — E038 Other specified hypothyroidism: Secondary | ICD-10-CM | POA: Diagnosis not present

## 2017-10-20 NOTE — Patient Instructions (Addendum)
F/U with rectal in early September, call if you need me sooner  Use once daily generic claritin or zyrtec for allergies  Flush nostrils once or twice daily with saline (OTC netty pot0  May take sudafed one daily for the next 2 to 3 days to reduc drainage  You have excess allergy symptoms, nothing suggesting infection  Fasting lab sheet will be provided at next visit to be drwan in the Fall  Stimulate your brain , and continue to enjoy life!  Careful not to fall and keep active  All the best to you and yours for 2019!  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

## 2017-10-21 ENCOUNTER — Encounter: Payer: Self-pay | Admitting: Family Medicine

## 2017-10-21 ENCOUNTER — Other Ambulatory Visit: Payer: Self-pay | Admitting: "Endocrinology

## 2017-10-21 DIAGNOSIS — J309 Allergic rhinitis, unspecified: Secondary | ICD-10-CM | POA: Insufficient documentation

## 2017-10-21 NOTE — Assessment & Plan Note (Signed)
Hyperlipidemia:Low fat diet discussed and encouraged.   Lipid Panel  Lab Results  Component Value Date   CHOL 206 (H) 12/08/2016   HDL 66 12/08/2016   LDLCALC 116 (H) 12/08/2016   TRIG 121 12/08/2016   CHOLHDL 3.1 12/08/2016   Updated lab needed at/ before next visit.

## 2017-10-21 NOTE — Assessment & Plan Note (Signed)
Regular exercise commitment continues and she  feels better and is doing well Managed by endo

## 2017-10-21 NOTE — Assessment & Plan Note (Signed)
Current flare of symptoms, educated re use of daily  OTC allergy med, saline nasal flush , and possibly sudafed until she gets relief , then as needed

## 2017-10-21 NOTE — Assessment & Plan Note (Signed)
Managed by endo and controled

## 2017-10-21 NOTE — Progress Notes (Signed)
   Amanda Harmon     MRN: [id number]      DOB: Feb 20, 1946   HPI Amanda Harmon is here for follow up and re-evaluation of chronic medical conditions, medication management and review of any available recent lab and radiology data.  C/o head congestion and clear nasal drainage x 4 days, denies fever or chills Has committed to exercise, less back pain, no falls since last visit. Somewhat overwhelmed caring for her 72 y/o mother in law and that is her only personal stress Concerned about forgetfullness and memory loss although recent MMSE nearly perfect ROS Denies recent fever or chills.  Denies chest congestion, productive cough or wheezing. Denies chest pains, palpitations and leg swelling Denies abdominal pain, nausea, vomiting,diarrhea or constipation.   Denies dysuria, frequency, hesitancy or incontinence. Denies uncontrolled  joint pain, swelling and limitation in mobility. Denies headaches, seizures, numbness, or tingling. Denies depression, uncontrolled anxiety or insomnia. Denies skin break down or rash.   PE  BP 136/80   Pulse 94   Resp 16   Ht 5\' 5"  (1.651 m)   Wt 133 lb (60.3 kg)   SpO2 98%   BMI 22.13 kg/m   Patient alert and oriented and in no cardiopulmonary distress.  HEENT: No facial asymmetry, EOMI,   oropharynx pink and moist.  Neck supple no JVD, no mass.  Chest: Clear to auscultation bilaterally.  CVS: S1, S2 no murmurs, no S3.Regular rate.  ABD: Soft non tender.   Ext: No edema  MS: Adequate ROM spine, shoulders, hips and knees.  Skin: Intact, no ulcerations or rash noted.  Psych: Good eye contact, normal affect. Memory intact not anxious or depressed appearing.  CNS: CN 2-12 intact, power,  normal throughout.no focal deficits noted.   Assessment & Plan  Allergic rhinitis Current flare of symptoms, educated re use of daily  OTC allergy med, saline nasal flush , and possibly sudafed until she gets relief , then as  needed  Hypothyroidism Managed by endo and controled   Osteoporosis Regular exercise commitment continues and she  feels better and is doing well Managed by endo  Dyslipidemia Hyperlipidemia:Low fat diet discussed and encouraged.   Lipid Panel  Lab Results  Component Value Date   CHOL 206 (H) 12/08/2016   HDL 66 12/08/2016   LDLCALC 116 (H) 12/08/2016   TRIG 121 12/08/2016   CHOLHDL 3.1 12/08/2016   Updated lab needed at/ before next visit.

## 2018-01-15 ENCOUNTER — Other Ambulatory Visit: Payer: Self-pay | Admitting: "Endocrinology

## 2018-01-17 ENCOUNTER — Other Ambulatory Visit: Payer: Self-pay

## 2018-01-17 MED ORDER — LEVOTHYROXINE SODIUM 50 MCG PO TABS
50.0000 ug | ORAL_TABLET | Freq: Every morning | ORAL | 0 refills | Status: DC
Start: 2018-01-17 — End: 2018-07-05

## 2018-03-03 DIAGNOSIS — B354 Tinea corporis: Secondary | ICD-10-CM | POA: Diagnosis not present

## 2018-03-03 DIAGNOSIS — J069 Acute upper respiratory infection, unspecified: Secondary | ICD-10-CM | POA: Diagnosis not present

## 2018-03-18 ENCOUNTER — Encounter: Payer: Self-pay | Admitting: Family Medicine

## 2018-04-13 ENCOUNTER — Telehealth: Payer: Self-pay | Admitting: Family Medicine

## 2018-04-13 ENCOUNTER — Other Ambulatory Visit: Payer: Self-pay | Admitting: Family Medicine

## 2018-04-13 MED ORDER — SULFAMETHOXAZOLE-TRIMETHOPRIM 800-160 MG PO TABS: ORAL_TABLET | ORAL | 0 refills | Status: DC

## 2018-04-13 NOTE — Telephone Encounter (Signed)
Pt stopped by and dropped off rx bottle, for smz\tmp ds 800-160 tab  To [facility].

## 2018-04-13 NOTE — Telephone Encounter (Signed)
Medication is sent in 

## 2018-04-13 NOTE — Progress Notes (Signed)
septra 

## 2018-05-02 DIAGNOSIS — D225 Melanocytic nevi of trunk: Secondary | ICD-10-CM | POA: Diagnosis not present

## 2018-05-02 DIAGNOSIS — L820 Inflamed seborrheic keratosis: Secondary | ICD-10-CM | POA: Diagnosis not present

## 2018-06-10 ENCOUNTER — Other Ambulatory Visit: Payer: Self-pay | Admitting: Family Medicine

## 2018-06-10 DIAGNOSIS — Z1231 Encounter for screening mammogram for malignant neoplasm of breast: Secondary | ICD-10-CM

## 2018-06-20 ENCOUNTER — Ambulatory Visit: Payer: 59 | Admitting: Family Medicine

## 2018-06-20 ENCOUNTER — Ambulatory Visit (HOSPITAL_COMMUNITY): Payer: 59

## 2018-06-21 ENCOUNTER — Telehealth: Payer: Self-pay | Admitting: Family Medicine

## 2018-06-21 NOTE — Telephone Encounter (Signed)
[phone]  Patient came in today to see if she needed labs before her next visit. She gets some labs w/ Dr.Nida & some w/ Dr.Simpson. Can you please check into this and call her.

## 2018-06-27 ENCOUNTER — Other Ambulatory Visit: Payer: Self-pay | Admitting: Family Medicine

## 2018-06-27 ENCOUNTER — Ambulatory Visit (HOSPITAL_COMMUNITY)
Admission: RE | Admit: 2018-06-27 | Discharge: 2018-06-27 | Disposition: A | Discharge status: Home / Self Care | Payer: Medicare Other | Source: Ambulatory Visit | Attending: Family Medicine | Admitting: Family Medicine

## 2018-06-27 DIAGNOSIS — Z1231 Encounter for screening mammogram for malignant neoplasm of breast: Secondary | ICD-10-CM | POA: Diagnosis not present

## 2018-06-27 DIAGNOSIS — R928 Other abnormal and inconclusive findings on diagnostic imaging of breast: Secondary | ICD-10-CM

## 2018-06-28 ENCOUNTER — Telehealth: Payer: Self-pay

## 2018-06-28 ENCOUNTER — Ambulatory Visit (HOSPITAL_COMMUNITY)
Admission: RE | Admit: 2018-06-28 | Discharge: 2018-06-28 | Disposition: A | Discharge status: Home / Self Care | Payer: Medicare Other | Source: Ambulatory Visit | Attending: Family Medicine | Admitting: Family Medicine

## 2018-06-28 ENCOUNTER — Other Ambulatory Visit: Payer: Self-pay | Admitting: "Endocrinology

## 2018-06-28 DIAGNOSIS — E785 Hyperlipidemia, unspecified: Secondary | ICD-10-CM

## 2018-06-28 DIAGNOSIS — R928 Other abnormal and inconclusive findings on diagnostic imaging of breast: Secondary | ICD-10-CM | POA: Diagnosis not present

## 2018-06-28 DIAGNOSIS — N6489 Other specified disorders of breast: Secondary | ICD-10-CM | POA: Diagnosis not present

## 2018-06-28 DIAGNOSIS — R922 Inconclusive mammogram: Secondary | ICD-10-CM | POA: Diagnosis not present

## 2018-06-28 DIAGNOSIS — E039 Hypothyroidism, unspecified: Secondary | ICD-10-CM

## 2018-06-28 DIAGNOSIS — E559 Vitamin D deficiency, unspecified: Secondary | ICD-10-CM | POA: Diagnosis not present

## 2018-06-28 DIAGNOSIS — E038 Other specified hypothyroidism: Secondary | ICD-10-CM

## 2018-06-28 LAB — T4, FREE: Free T4: 1.2 ng/dL (ref 0.8–1.8)

## 2018-06-28 LAB — TSH: TSH: 2.97 m[IU]/L (ref 0.40–4.50)

## 2018-06-28 NOTE — Telephone Encounter (Signed)
Labs ordered.

## 2018-06-29 ENCOUNTER — Encounter: Payer: Self-pay | Admitting: Family Medicine

## 2018-06-29 LAB — VITAMIN D 25 HYDROXY (VIT D DEFICIENCY, FRACTURES): Vit D, 25-Hydroxy: 37 ng/mL (ref 30–100)

## 2018-06-29 LAB — CBC
HEMATOCRIT: 41.5 % (ref 35.0–45.0)
HEMOGLOBIN: 14.2 g/dL (ref 11.7–15.5)
MCH: 30.3 pg (ref 27.0–33.0)
MCHC: 34.2 g/dL (ref 32.0–36.0)
MCV: 88.5 fL (ref 80.0–100.0)
MPV: 10.2 fL (ref 7.5–12.5)
Platelets: 226 10*3/uL (ref 140–400)
RBC: 4.69 10*6/uL (ref 3.80–5.10)
RDW: 12.8 % (ref 11.0–15.0)
WBC: 4.3 10*3/uL (ref 3.8–10.8)

## 2018-06-29 LAB — COMPLETE METABOLIC PANEL WITH GFR
AG Ratio: 2.5 (calc) (ref 1.0–2.5)
ALBUMIN MSPROF: 4.2 g/dL (ref 3.6–5.1)
ALKALINE PHOSPHATASE (APISO): 57 U/L (ref 33–130)
ALT: 10 U/L (ref 6–29)
AST: 16 U/L (ref 10–35)
BILIRUBIN TOTAL: 0.5 mg/dL (ref 0.2–1.2)
BUN: 15 mg/dL (ref 7–25)
CO2: 31 mmol/L (ref 20–32)
CREATININE: 0.66 mg/dL (ref 0.60–0.93)
Calcium: 9.3 mg/dL (ref 8.6–10.4)
Chloride: 103 mmol/L (ref 98–110)
GFR, Est African American: 103 mL/min/{1.73_m2} (ref >=60)
GFR, Est Non African American: 89 mL/min/{1.73_m2} (ref >=60)
GLUCOSE: 92 mg/dL (ref 65–99)
Globulin: 1.7 g/dL (calc) — ABNORMAL LOW (ref 1.9–3.7)
Potassium: 4 mmol/L (ref 3.5–5.3)
Sodium: 141 mmol/L (ref 135–146)
TOTAL PROTEIN: 5.9 g/dL — AB (ref 6.1–8.1)

## 2018-06-29 LAB — LIPID PANEL
Cholesterol: 212 mg/dL — ABNORMAL HIGH (ref <200)
HDL: 59 mg/dL (ref >50)
LDL CHOLESTEROL (CALC): 134 mg/dL — AB
NON-HDL CHOLESTEROL (CALC): 153 mg/dL — AB (ref <130)
TRIGLYCERIDES: 89 mg/dL (ref <150)
Total CHOL/HDL Ratio: 3.6 (calc) (ref <5.0)

## 2018-07-05 ENCOUNTER — Encounter: Payer: Self-pay | Admitting: Family Medicine

## 2018-07-05 ENCOUNTER — Ambulatory Visit (INDEPENDENT_AMBULATORY_CARE_PROVIDER_SITE_OTHER): Payer: Medicare Other | Admitting: Family Medicine

## 2018-07-05 VITALS — BP 126/82 | HR 77 | Resp 15 | Ht 65.0 in | Wt 133.8 lb

## 2018-07-05 DIAGNOSIS — E785 Hyperlipidemia, unspecified: Secondary | ICD-10-CM

## 2018-07-05 DIAGNOSIS — Z9181 History of falling: Secondary | ICD-10-CM

## 2018-07-05 DIAGNOSIS — E038 Other specified hypothyroidism: Secondary | ICD-10-CM

## 2018-07-05 DIAGNOSIS — Z23 Encounter for immunization: Secondary | ICD-10-CM | POA: Diagnosis not present

## 2018-07-05 NOTE — Patient Instructions (Addendum)
Wellness with nurse to be scheduled  I will contact you re colon screen  Keep up great healthy  habits   Flu vaccine today  Please get shingrix x 2 from November onward

## 2018-07-13 ENCOUNTER — Ambulatory Visit (INDEPENDENT_AMBULATORY_CARE_PROVIDER_SITE_OTHER): Payer: Medicare Other

## 2018-07-13 VITALS — BP 110/80 | HR 66 | Resp 10 | Ht 65.0 in | Wt 134.0 lb

## 2018-07-13 DIAGNOSIS — Z0000 Encounter for general adult medical examination without abnormal findings: Secondary | ICD-10-CM | POA: Diagnosis not present

## 2018-07-13 NOTE — Progress Notes (Signed)
Subjective:   Amanda Harmon is a 72 y.o. female who presents for Medicare Annual (Subsequent) preventive examination.  Review of Systems:        Objective:     Vitals: There were no vitals taken for this visit.  There is no height or weight on file to calculate BMI.  Advanced Directives 04/27/2017 04/13/2017 12/15/2016 08/31/2016 02/20/2015 01/17/2015  Does Patient Have a Medical Advance Directive? No No No No No No  Would patient like information on creating a medical advance directive? 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    Cystitis    Hyperlipidemia    borderline    Mitral valve prolapse 1984   Thyroid disease 2015   hypothyroidism   Vertigo    Past Surgical History:  Procedure Laterality Date   BACK SURGERY  2008   BREAST BIOPSY  1998   left - benign   CARPECTOMY HAND Right 06/22/2016   COLONOSCOPY N/A 01/17/2015   Procedure: COLONOSCOPY;  Surgeon: Rogene Houston, MD;  Location: [facility];  Service: Endoscopy;  Laterality: N/A;  830 - moved to 4/21 @ 8:30   EYE SURGERY Left 12/01/2013   cataract   EYE SURGERY Right 12/15/2013   cataract   IR GENERIC HISTORICAL  08/19/2016   IR RADIOLOGIST EVAL & MGMT 08/19/2016 [facility]   SPINE SURGERY     TONSILLECTOMY  1965   TUBAL LIGATION  1983   Family History  Problem Relation Age of Onset   Heart attack Mother 9       used tobacco    Arthritis  Mother        severe    Heart attack Father 53       used tobacco    Alcohol abuse Father    Cancer Brother 84       prostate    Osteoporosis Sister        severe    Osteoarthritis Sister    Social History   Socioeconomic History   Marital status: Married    Spouse name: Not on file   Number of children: 3   Years of education: Not on file   Highest education level: Not on file  Occupational History   Occupation: Arts development officer: RETIRED  Social Designer, fashion/clothing strain: Not on file   Food insecurity:    Worry: Not on file    Inability: Not on file   Transportation needs:    Medical: Not on file    Non-medical: Not on file  Tobacco Use   Smoking status: Former Smoker    Packs/day: 1.00    Years: 4.00    Pack years: 4.00    Last attempt to quit: 12/15/1968    Years since quitting: 49.6   Smokeless tobacco:  Never Used  Substance and Sexual Activity   Alcohol use: Yes    Comment: socially    Drug use: No   Sexual activity: Yes  Lifestyle   Physical activity:    Days per week: Not on file    Minutes per session: Not on file   Stress: Not on file  Relationships   Social connections:    Talks on phone: Not on file    Gets together: Not on file    Attends religious service: Not on file    Active member of club or organization: Not on file    Attends meetings of clubs or organizations: Not on file    Relationship status: Not on file  Other Topics Concern   Not on file  Social History Narrative   Not on file    Outpatient Encounter Medications as of 07/13/2018  Medication Sig   alendronate (FOSAMAX) 70 MG tablet Take 1 tablet (70 mg total) by mouth once a week. Take with a full glass of water on an empty stomach.   Ascorbic Acid (VITAMIN C) 1000 MG tablet Take 1,000 mg by mouth daily.   Biotin 10000 MCG TABS Take 1 tablet by mouth daily.   Calcium Carbonate-Vit D-Min (CALCIUM 1200 PO) Take 1 tablet by  mouth daily.   Cholecalciferol (VITAMIN D3) 5000 units CAPS Take 1 capsule (5,000 Units total) by mouth daily.   ketotifen (ZADITOR) 0.025 % ophthalmic solution Place 1 drop into both eyes every morning.   levothyroxine (SYNTHROID, LEVOTHROID) 50 MCG tablet Take 1 tablet (50 mcg total) by mouth every morning.   sulfamethoxazole-trimethoprim (BACTRIM DS,SEPTRA DS) 800-160 MG tablet One tablet two times daily as needed (Patient taking differently: 1 tablet daily. One tablet two times daily as needed)   No facility-administered encounter medications on file as of 07/13/2018.     Activities of Daily Living No flowsheet data found.  Patient Care Team: Fayrene Helper, MD as PCP - General (Family Medicine) Iran Planas, MD as Consulting Physician (Orthopedic Surgery) Burnell Blanks, MD as Consulting Physician (Cardiology)    Assessment:   This is a routine wellness examination for Amanda Harmon.  Exercise Activities and Dietary recommendations    Goals     Exercise strength training     Patient would like to start strength training 3 days a week for 30 minutes at a time.        Fall Risk Fall Risk  08/24/2017 12/15/2016 08/14/2016 04/13/2016 11/05/2015  Falls in the past year? No Yes No No Yes  Number falls in past yr: - 2 or more - - 2 or more  Injury with Fall? - Yes - - Yes  Risk for fall due to : - - - - Other (Comment)  Follow up - Falls evaluation completed;Falls prevention discussed - - Falls evaluation completed;Education provided  Comment - reveals patient accidentally stumbled - - -   Is the patient's home free of loose throw rugs in walkways, pet beds, electrical cords, etc?   no      Grab bars in the bathroom? no      Handrails on the stairs?   yes      Adequate lighting?   yes  Timed Get Up and Go performed:   Depression Screen PHQ 2/9 Scores 10/20/2017 08/24/2017 12/15/2016 08/14/2016  PHQ - 2 Score 0 0 0 0  PHQ- 9 Score 0 - - -     Cognitive  Function     6CIT Screen  12/15/2016  What Year? 0 points  What month? 0 points  What time? 0 points  Count back from 20 0 points  Months in reverse 0 points  Repeat phrase 0 points  Total Score 0    Immunization History  Administered Date(s) Administered   Influenza Split 06/17/2014, 07/23/2017   Influenza, High Dose Seasonal PF 07/05/2018   Influenza,inj,Quad PF,6+ Mos 07/20/2013, 07/01/2015, 07/15/2016   Pneumococcal Conjugate-13 10/30/2014   Pneumococcal Polysaccharide-23 03/23/2012   Tdap 12/09/2012   Zoster 02/24/2007    Qualifies for Shingles Vaccine? Ask insurance if they will pay   Screening Tests Health Maintenance  Topic Date Due   MAMMOGRAM  06/27/2020   TETANUS/TDAP  12/10/2022   COLONOSCOPY  01/16/2025   INFLUENZA VACCINE  Completed   DEXA SCAN  Completed   Hepatitis C Screening  Completed   PNA vac Low Risk Adult  Completed    Cancer Screenings: Lung: Low Dose CT Chest recommended if Age 61-80 years, 30 pack-year currently smoking OR have quit w/in 15years. Patient does not qualify. Breast:  Up to date on Mammogram? Yes   Up to date of Bone Density/Dexa? Yes Colorectal: up to date   Additional Screenings:  Hepatitis C Screening: Complete      Plan:   Plan to continue physical activity, and also find time to clean out and organize house  I have personally reviewed and noted the following in the patients chart:    Medical and social history  Use of alcohol, tobacco or illicit drugs   Current medications and supplements  Functional ability and status  Nutritional status  Physical activity  Advanced directives  List of other physicians  Hospitalizations, surgeries, and ER visits in previous 12 months  Vitals  Screenings to include cognitive, depression, and falls  Referrals and appointments  In addition, I have reviewed and discussed with patient certain preventive protocols, quality metrics, and best practice  recommendations. A written personalized care plan for preventive services as well as general preventive health recommendations were provided to patient.     Duane Lope Rakes  07/13/2018

## 2018-07-13 NOTE — Patient Instructions (Signed)
[name] , Thank you for taking time to come for your Medicare Wellness Visit. I appreciate your ongoing commitment to your health goals. Please review the following plan we discussed and let me know if I can assist you in the future.   Screening recommendations/referrals: Colonoscopy: up to date Mammogram: up to date  Bone Density: up to date  Recommended yearly ophthalmology/optometry visit for glaucoma screening and checkup Recommended yearly dental visit for hygiene and checkup  Vaccinations: Influenza vaccine: up to date  Pneumococcal vaccine: up to date  Tdap vaccine: up to date  Shingles vaccine: up to date     Advanced directives: has already, needs notarized   Conditions/risks identified: fall risk   Next appointment: wellness visit in one year    Preventive Care 27 Years and Older, Female Preventive care refers to lifestyle choices and visits with your health care provider that can promote health and wellness. What does preventive care include?  A yearly physical exam. This is also called an annual well check.  Dental exams once or twice a year.  Routine eye exams. Ask your health care provider how often you should have your eyes checked.  Personal lifestyle choices, including:  Daily care of your teeth and gums.  Regular physical activity.  Eating a healthy diet.  Avoiding tobacco and drug use.  Limiting alcohol use.  Practicing safe sex.  Taking low-dose aspirin every day.  Taking vitamin and mineral supplements as recommended by your health care provider. What happens during an annual well check? The services and screenings done by your health care provider during your annual well check will depend on your age, overall health, lifestyle risk factors, and family history of disease. Counseling  Your health care provider may ask you questions about your:  Alcohol use.  Tobacco use.  Drug use.  Emotional well-being.  Home and relationship  well-being.  Sexual activity.  Eating habits.  History of falls.  Memory and ability to understand (cognition).  Work and work Statistician.  Reproductive health. Screening  You may have the following tests or measurements:  Height, weight, and BMI.  Blood pressure.  Lipid and cholesterol levels. These may be checked every 5 years, or more frequently if you are over 48 years old.  Skin check.  Lung cancer screening. You may have this screening every year starting at age 47 if you have a 30-pack-year history of smoking and currently smoke or have quit within the past 15 years.  Fecal occult blood test (FOBT) of the stool. You may have this test every year starting at age 87.  Flexible sigmoidoscopy or colonoscopy. You may have a sigmoidoscopy every 5 years or a colonoscopy every 10 years starting at age 40.  Hepatitis C blood test.  Hepatitis B blood test.  Sexually transmitted disease (STD) testing.  Diabetes screening. This is done by checking your blood sugar (glucose) after you have not eaten for a while (fasting). You may have this done every 1-3 years.  Bone density scan. This is done to screen for osteoporosis. You may have this done starting at age 37.  Mammogram. This may be done every 1-2 years. Talk to your health care provider about how often you should have regular mammograms. Talk with your health care provider about your test results, treatment options, and if necessary, the need for more tests. Vaccines  Your health care provider may recommend certain vaccines, such as:  Influenza vaccine. This is recommended every year.  Tetanus, diphtheria, and  acellular pertussis (Tdap, Td) vaccine. You may need a Td booster every 10 years.  Zoster vaccine. You may need this after age 52.  Pneumococcal 13-valent conjugate (PCV13) vaccine. One dose is recommended after age 20.  Pneumococcal polysaccharide (PPSV23) vaccine. One dose is recommended after age  59. Talk to your health care provider about which screenings and vaccines you need and how often you need them. This information is not intended to replace advice given to you by your health care provider. Make sure you discuss any questions you have with your health care provider. Document Released: 10/11/2015 Document Revised: 06/03/2016 Document Reviewed: 07/16/2015 Elsevier Interactive Patient Education  2017 [place] Prevention in the Home Falls can cause injuries. They can happen to people of all ages. There are many things you can do to make your home safe and to help prevent falls. What can I do on the outside of my home?  Regularly fix the edges of walkways and driveways and fix any cracks.  Remove anything that might make you trip as you walk through a door, such as a raised step or threshold.  Trim any bushes or trees on the path to your home.  Use bright outdoor lighting.  Clear any walking paths of anything that might make someone trip, such as rocks or tools.  Regularly check to see if handrails are loose or broken. Make sure that both sides of any steps have handrails.  Any raised decks and porches should have guardrails on the edges.  Have any leaves, snow, or ice cleared regularly.  Use sand or salt on walking paths during winter.  Clean up any spills in your garage right away. This includes oil or grease spills. What can I do in the bathroom?  Use night lights.  Install grab bars by the toilet and in the tub and shower. Do not use towel bars as grab bars.  Use non-skid mats or decals in the tub or shower.  If you need to sit down in the shower, use a plastic, non-slip stool.  Keep the floor dry. Clean up any water that spills on the floor as soon as it happens.  Remove soap buildup in the tub or shower regularly.  Attach bath mats securely with double-sided non-slip rug tape.  Do not have throw rugs and other things on the floor that can make  you trip. What can I do in the bedroom?  Use night lights.  Make sure that you have a light by your bed that is easy to reach.  Do not use any sheets or blankets that are too big for your bed. They should not hang down onto the floor.  Have a firm chair that has side arms. You can use this for support while you get dressed.  Do not have throw rugs and other things on the floor that can make you trip. What can I do in the kitchen?  Clean up any spills right away.  Avoid walking on wet floors.  Keep items that you use a lot in easy-to-reach places.  If you need to reach something above you, use a strong step stool that has a grab bar.  Keep electrical cords out of the way.  Do not use floor polish or wax that makes floors slippery. If you must use wax, use non-skid floor wax.  Do not have throw rugs and other things on the floor that can make you trip. What can I do with my stairs?  Do not leave any items on the stairs.  Make sure that there are handrails on both sides of the stairs and use them. Fix handrails that are broken or loose. Make sure that handrails are as long as the stairways.  Check any carpeting to make sure that it is firmly attached to the stairs. Fix any carpet that is loose or worn.  Avoid having throw rugs at the top or bottom of the stairs. If you do have throw rugs, attach them to the floor with carpet tape.  Make sure that you have a light switch at the top of the stairs and the bottom of the stairs. If you do not have them, ask someone to add them for you. What else can I do to help prevent falls?  Wear shoes that:  Do not have high heels.  Have rubber bottoms.  Are comfortable and fit you well.  Are closed at the toe. Do not wear sandals.  If you use a stepladder:  Make sure that it is fully opened. Do not climb a closed stepladder.  Make sure that both sides of the stepladder are locked into place.  Ask someone to hold it for you, if  possible.  Clearly mark and make sure that you can see:  Any grab bars or handrails.  First and last steps.  Where the edge of each step is.  Use tools that help you move around (mobility aids) if they are needed. These include:  Canes.  Walkers.  Scooters.  Crutches.  Turn on the lights when you go into a dark area. Replace any light bulbs as soon as they burn out.  Set up your furniture so you have a clear path. Avoid moving your furniture around.  If any of your floors are uneven, fix them.  If there are any pets around you, be aware of where they are.  Review your medicines with your doctor. Some medicines can make you feel dizzy. This can increase your chance of falling. Ask your doctor what other things that you can do to help prevent falls. This information is not intended to replace advice given to you by your health care provider. Make sure you discuss any questions you have with your health care provider. Document Released: 07/11/2009 Document Revised: 02/20/2016 Document Reviewed: 10/19/2014 Elsevier Interactive Patient Education  2017 [employer].

## 2018-07-18 ENCOUNTER — Other Ambulatory Visit: Payer: Self-pay | Admitting: "Endocrinology

## 2018-07-21 ENCOUNTER — Other Ambulatory Visit: Payer: Self-pay | Admitting: "Endocrinology

## 2018-07-21 DIAGNOSIS — M8580 Other specified disorders of bone density and structure, unspecified site: Secondary | ICD-10-CM

## 2018-08-15 ENCOUNTER — Other Ambulatory Visit: Payer: Self-pay

## 2018-08-21 ENCOUNTER — Encounter: Payer: Self-pay | Admitting: Family Medicine

## 2018-08-21 NOTE — Assessment & Plan Note (Signed)
Home safety reviewed, no falls since last visit

## 2018-08-21 NOTE — Assessment & Plan Note (Signed)
Managed by endo and controlled 

## 2018-08-21 NOTE — Assessment & Plan Note (Addendum)
After obtaining informed consent, the vaccine is  administered 

## 2018-08-21 NOTE — Progress Notes (Signed)
   Amanda Harmon     MRN: [id number]      DOB: [birth year]   HPI Ms. Xu is here for follow up and re-evaluation of chronic medical conditions, medication management and review of any available recent lab and radiology data.  Preventive health is updated, specifically  Cancer screening and Immunization.   Questions or concerns regarding consultations or procedures which the PT has had in the interim are  addressed. The PT denies any adverse reactions to current medications since the last visit.  There are no new concerns.  There are no specific complaints   ROS Denies recent fever or chills. Denies sinus pressure, nasal congestion, ear pain or sore throat. Denies chest congestion, productive cough or wheezing. Denies chest pains, palpitations and leg swelling Denies abdominal pain, nausea, vomiting,diarrhea or constipation.   Denies dysuria, frequency, hesitancy or incontinence. Denies joint pain, swelling and limitation in mobility. Denies headaches, seizures, numbness, or tingling. Denies depression, anxiety or insomnia. Denies skin break down or rash.   PE  BP 126/82   Pulse 77   Resp 15   Ht 5\' 5"  (1.651 m)   Wt 133 lb 12.8 oz (60.7 kg)   SpO2 98%   BMI 22.27 kg/m   Patient alert and oriented and in no cardiopulmonary distress.  HEENT: No facial asymmetry, EOMI,   oropharynx pink and moist.  Neck supple no JVD, no mass.  Chest: Clear to auscultation bilaterally.  CVS: S1, S2 no murmurs, no S3.Regular rate.  ABD: Soft non tender.   Ext: No edema  MS: Adequate ROM spine, shoulders, hips and knees.  Skin: Intact, no ulcerations or rash noted.  Psych: Good eye contact, normal affect. Memory intact not anxious or depressed appearing.  CNS: CN 2-12 intact, power,  normal throughout.no focal deficits noted.   Assessment & Plan  Hypothyroidism Managed by endo and controlled  Dyslipidemia Hyperlipidemia:Low fat diet discussed and encouraged.   Lipid  Panel  Lab Results  Component Value Date   CHOL 212 (H) 06/28/2018   HDL 59 06/28/2018   LDLCALC 134 (H) 06/28/2018   TRIG 89 06/28/2018   CHOLHDL 3.6 06/28/2018  uncontrolled , not at goal      Need for immunization against influenza After obtaining informed consent, the vaccine is  administered   At high risk for injury related to fall Home safety reviewed, no falls since last visit

## 2018-08-21 NOTE — Assessment & Plan Note (Signed)
Hyperlipidemia:Low fat diet discussed and encouraged.   Lipid Panel  Lab Results  Component Value Date   CHOL 212 (H) 06/28/2018   HDL 59 06/28/2018   LDLCALC 134 (H) 06/28/2018   TRIG 89 06/28/2018   CHOLHDL 3.6 06/28/2018  uncontrolled , not at goal

## 2018-08-24 ENCOUNTER — Ambulatory Visit: Payer: Medicare Other | Admitting: "Endocrinology

## 2018-08-30 ENCOUNTER — Ambulatory Visit: Payer: Medicare Other | Admitting: "Endocrinology

## 2018-09-07 DIAGNOSIS — H43812 Vitreous degeneration, left eye: Secondary | ICD-10-CM | POA: Diagnosis not present

## 2018-10-04 DIAGNOSIS — M722 Plantar fascial fibromatosis: Secondary | ICD-10-CM | POA: Diagnosis not present

## 2018-10-04 DIAGNOSIS — M79671 Pain in right foot: Secondary | ICD-10-CM | POA: Diagnosis not present

## 2018-10-05 ENCOUNTER — Other Ambulatory Visit: Payer: Self-pay | Admitting: "Endocrinology

## 2018-10-05 ENCOUNTER — Encounter: Payer: Self-pay | Admitting: "Endocrinology

## 2018-10-05 ENCOUNTER — Ambulatory Visit (INDEPENDENT_AMBULATORY_CARE_PROVIDER_SITE_OTHER): Payer: Medicare Other | Admitting: "Endocrinology

## 2018-10-05 VITALS — BP 130/82 | HR 66 | Ht 65.0 in | Wt 132.0 lb

## 2018-10-05 DIAGNOSIS — M810 Age-related osteoporosis without current pathological fracture: Secondary | ICD-10-CM | POA: Diagnosis not present

## 2018-10-05 DIAGNOSIS — M8000XG Age-related osteoporosis with current pathological fracture, unspecified site, subsequent encounter for fracture with delayed healing: Secondary | ICD-10-CM

## 2018-10-05 DIAGNOSIS — E038 Other specified hypothyroidism: Secondary | ICD-10-CM

## 2018-10-05 DIAGNOSIS — M8580 Other specified disorders of bone density and structure, unspecified site: Secondary | ICD-10-CM

## 2018-10-05 MED ORDER — LEVOTHYROXINE SODIUM 50 MCG PO TABS: 50.0000 ug | ORAL_TABLET | Freq: Every morning | ORAL | 4 refills | Status: DC

## 2018-10-05 MED ORDER — ALENDRONATE SODIUM 70 MG PO TABS: ORAL_TABLET | ORAL | 2 refills | Status: DC

## 2018-10-05 NOTE — Progress Notes (Signed)
Endocrinology follow-up note  Subjective:    Patient ID: Amanda Harmon, female    DOB: [birth year], PCP Amanda Helper, MD   Past Medical History:  Diagnosis Date  . 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 Cystitis   . Hyperlipidemia    borderline   . Mitral valve prolapse 1984  . Thyroid disease 2015   hypothyroidism  . Vertigo    Past Surgical History:  Procedure Laterality Date  . BACK SURGERY  2008  . BREAST BIOPSY  1998   left - benign  . CARPECTOMY HAND Right 06/22/2016  . COLONOSCOPY N/A 01/17/2015   Procedure: COLONOSCOPY;  Surgeon: Rogene Houston, MD;  [facility]: [facility];  Service: Endoscopy;  Laterality: N/A;  830 - moved to 4/21 @ 8:30  . EYE SURGERY Left 12/01/2013   cataract  . EYE SURGERY Right 12/15/2013   cataract  . IR GENERIC HISTORICAL  08/19/2016   IR RADIOLOGIST EVAL & MGMT 08/19/2016 [facility]  . SPINE SURGERY    . TONSILLECTOMY  1965  . TUBAL LIGATION  1983   Social History   Socioeconomic History  . Marital status: Married    Spouse name: Chrissie Noa  . Number of children: 3  . Years of education: master's degree  . Highest education level: Master's degree (e.g., MA, MS, MEng, MEd, MSW, MBA)  Occupational History  . Occupation: Arts development officer: RETIRED  Social Needs  . Financial resource strain: Not hard at all  . Food insecurity:    Worry: Never true    Inability: Never true  . Transportation needs:    Medical: No    Non-medical: No  Tobacco Use  . Smoking status: Former Smoker    Packs/day: 1.00    Years: 4.00    Pack years: 4.00    Last attempt to quit: 12/15/1968    Years since quitting: 49.8  . Smokeless tobacco: Never Used  Substance and Sexual Activity  . Alcohol use: Yes    Comment: socially   . Drug use: No  . Sexual activity: Yes  Lifestyle  . Physical activity:    Days per week: 5 days    Minutes per session: 60 min  . Stress: Not at all  Relationships  . Social  connections:    Talks on phone: More than three times a week    Gets together: More than three times a week    Attends religious service: 1 to 4 times per year    Active member of club or organization: Yes    Attends meetings of clubs or organizations: More than 4 times per year    Relationship status: Married  Other Topics Concern  . Not on file  Social History Narrative  . Not on file   Outpatient Encounter Medications as of 10/05/2018  Medication Sig  . alendronate (FOSAMAX) 70 MG tablet TAKE 1 TABLET BY MOUTH WEEKLY WITH A FULL GLASS OF WATER ON AN EMPTY STOMACH  . Ascorbic Acid (VITAMIN C) 1000 MG tablet Take 1,000 mg by mouth daily.  . Biotin 10000 MCG TABS Take 1 tablet by mouth daily.  . Calcium Carbonate-Vit D-Min (CALCIUM 1200 PO) Take 1 tablet by mouth daily.  . Cholecalciferol (VITAMIN D3) 5000 units CAPS Take 1 capsule (5,000 Units total) by mouth daily.  Marland Kitchen ketotifen (ZADITOR) 0.025 % ophthalmic solution Place 1 drop into both eyes every morning.  Marland Kitchen levothyroxine (SYNTHROID, LEVOTHROID) 50 MCG tablet Take  1 tablet (50 mcg total) by mouth every morning.  . sulfamethoxazole-trimethoprim (BACTRIM DS,SEPTRA DS) 800-160 MG tablet One tablet two times daily as needed (Patient taking differently: 1 tablet daily. One tablet two times daily as needed)  . [DISCONTINUED] alendronate (FOSAMAX) 70 MG tablet TAKE 1 TABLET BY MOUTH WEEKLY WITH A FULL GLASS OF WATER ON AN EMPTY STOMACH  . [DISCONTINUED] levothyroxine (SYNTHROID, LEVOTHROID) 50 MCG tablet Take 1 tablet (50 mcg total) by mouth every morning.  . [DISCONTINUED] levothyroxine (SYNTHROID, LEVOTHROID) 50 MCG tablet TAKE 1 TABLET BY MOUTH IN THE MORNING   No facility-administered encounter medications on file as of 10/05/2018.    ALLERGIES: Allergies  Allergen Reactions  . Aspirin Other (See Comments)    Bruising  . Poison Oak Extract [Poison Oak Extract] Rash   VACCINATION STATUS: Immunization History  Administered Date(s)  Administered  . Influenza Split 06/17/2014, 07/23/2017  . Influenza, High Dose Seasonal PF 07/05/2018  . Influenza,inj,Quad PF,6+ Mos 07/20/2013, 07/01/2015, 07/15/2016  . Pneumococcal Conjugate-13 10/30/2014  . Pneumococcal Polysaccharide-23 03/23/2012  . Tdap 12/09/2012  . Zoster 02/24/2007    HPI  73 yr old female with medical hx as above. She is here to f/u  For her hypothyroidism and osteoporosis with repeat TFTs. For hypothyroidism, she was put on levothyroxine 50 mcg po qam, she is tolerating well. - she remains on alendronate 70 mg by mouth weekly by Dr. Moshe Cipro her primary medical doctor.  She has history of hand fracture 2 years ago.  Currently no interval problems, no further falls.   -She missed her appointment for repeat bone density, based on her  bone density , on 08/15/2016 she was regaining significant (9.5%) bone mass on the spine, that time back  in the osteopenia range reversing from osteoporosis.  Her prior DXA on 06/13/2014 shows T score -2.2 on L3 ( L4 has -3.1 , but excluded due to advanced DJD). - She has history of compression fracture of first lumbar vertebra. No recent height change. - Dual  Femur analysis showed non significant change since 2015 and still  considered osteopenic with a T score of -1.7 .   She denies hx of goiter. Her last menstrual period was at age 9. she has 3 grown children. she smoked for 4 years in college. she never took steroids on chronic basis. she denies cold/heat intolerance. she is active , exercises regularly.   Review of Systems Constitutional:  + Steady weight since last visit- a good development for her, no fatigue, no subjective hyperthermia/hypothermia Eyes: no blurry vision, no xerophthalmia ENT: no sore throat, no nodules palpated in throat, no dysphagia/odynophagia, no hoarseness Musculoskeletal: no muscle/joint aches. Skin: no rashes Neurological: no tremors/numbness/tingling/dizziness Psychiatric: no  depression/anxiety  Objective:    BP 130/82   Pulse 66   Ht 5\' 5"  (1.651 m)   Wt 132 lb (59.9 kg)   BMI 21.97 kg/m   Wt Readings from Last 3 Encounters:  10/05/18 132 lb (59.9 kg)  07/13/18 134 lb (60.8 kg)  07/05/18 133 lb 12.8 oz (60.7 kg)    Physical Exam  Constitutional: overweight, in NAD Eyes: PERRLA, EOMI, no exophthalmos ENT: moist mucous membranes, no thyromegaly, no cervical lymphadenopathy  Musculoskeletal: no deformities, strength intact in all 4 Skin: moist, warm, no rashes Neurological: no tremor with outstretched hands, DTR normal in all 4  CMP     Component Value Date/Time   NA 141 06/28/2018 0924   K 4.0 06/28/2018 0924   CL 103 06/28/2018 0924  CO2 31 06/28/2018 0924   GLUCOSE 92 06/28/2018 0924   BUN 15 06/28/2018 0924   CREATININE 0.66 06/28/2018 0924   CALCIUM 9.3 06/28/2018 0924   PROT 5.9 (L) 06/28/2018 0924   ALBUMIN 4.0 12/08/2016 0826   AST 16 06/28/2018 0924   ALT 10 06/28/2018 0924   ALKPHOS 59 12/08/2016 0826   BILITOT 0.5 06/28/2018 0924   GFRNONAA 89 06/28/2018 0924   GFRAA 103 06/28/2018 0924   Lipid Panel     Component Value Date/Time   CHOL 212 (H) 06/28/2018 0924   TRIG 89 06/28/2018 0924   HDL 59 06/28/2018 0924   CHOLHDL 3.6 06/28/2018 0924   VLDL 24 12/08/2016 0829   LDLCALC 134 (H) 06/28/2018 0924   June 28, 2018 labs TSH 2.9 7, free T4 1.2  Assessment & Plan:   1.  hypothyroidism Her thyroid function test from October 2019 were consistent with appropriate replacement.  She is advised to continue thyroxine 50 mcg po qam.  - We discussed about correct intake of levothyroxine, at fasting, with water, separated by at least 30 minutes from breakfast, and separated by more than 4 hours from calcium, iron, multivitamins, acid reflux medications (PPIs). -Patient is made aware of the fact that thyroid hormone replacement is needed for life, dose to be adjusted by periodic monitoring of thyroid function tests.  2.  Osteoporosis  - she is tolerating alendronate 70 mg by mouth weekly.  - Based on her most recent bone density, she has  regained some bone mass on the spine compared to prior study,  and no change on  Dual Femur BMD analysis. - This is better than expected outcome of therapy with alendronate. -  I encouraged her to stay on  alendronate 70 mg weekly for now. She has a propensity for falling ( no recent episodes), and  I advised her on precautions to avoid falling including walking on flat surface or treadmill instead of on outside pavements, and pick up  Strengthening exercises for back muscles. - She is status post therapy with vitamin D 50,000 units weekly for 6 months, her last vitamin D level was 54. - I advised her to continue vitamin D3 2,000 units daily for the next 6 months.  -She will continue with calcium carbonate 1200 mg daily with lunch.  - She will have repeat bone density anytime the next few weeks, will be called if therapeutic changes necessary.   -If her BMD response is not satisfactory, Or if she develops interim fracture, she will be considered for Prolia.  3. Hyperlipidemia:  - She has family history of hyperlipidemia and coronary artery disease. Her LDL is  116 slightly improving from 132 along with HDL at 66. She would like to avoid therapy for now. Her LDL was 138 in January 2016 .  - I advised patient to maintain close follow up with Amanda Helper, MD for primary care needs. Follow up plan: Return in about 1 year (around 10/06/2019) for Follow up with Bone Density, Follow up with Pre-visit Labs.  Glade Lloyd, MD Phone: [phone]  Fax: [phone]   10/05/2018, 11:03 AM

## 2018-10-06 ENCOUNTER — Other Ambulatory Visit: Payer: Self-pay

## 2018-10-06 DIAGNOSIS — M810 Age-related osteoporosis without current pathological fracture: Secondary | ICD-10-CM

## 2018-10-06 DIAGNOSIS — E559 Vitamin D deficiency, unspecified: Secondary | ICD-10-CM

## 2018-10-06 DIAGNOSIS — E1165 Type 2 diabetes mellitus with hyperglycemia: Secondary | ICD-10-CM

## 2018-10-06 DIAGNOSIS — E039 Hypothyroidism, unspecified: Secondary | ICD-10-CM

## 2018-10-06 DIAGNOSIS — M8580 Other specified disorders of bone density and structure, unspecified site: Secondary | ICD-10-CM

## 2018-10-06 MED ORDER — ALENDRONATE SODIUM 70 MG PO TABS: ORAL_TABLET | ORAL | 2 refills | Status: DC

## 2018-10-06 MED ORDER — LEVOTHYROXINE SODIUM 50 MCG PO TABS: 50.0000 ug | ORAL_TABLET | Freq: Every morning | ORAL | 2 refills | Status: DC

## 2018-10-26 DIAGNOSIS — L308 Other specified dermatitis: Secondary | ICD-10-CM | POA: Diagnosis not present

## 2018-11-03 ENCOUNTER — Ambulatory Visit (HOSPITAL_COMMUNITY)
Admission: RE | Admit: 2018-11-03 | Discharge: 2018-11-03 | Disposition: A | Discharge status: Home / Self Care | Payer: Medicare Other | Source: Ambulatory Visit | Attending: "Endocrinology | Admitting: "Endocrinology

## 2018-11-03 DIAGNOSIS — M8580 Other specified disorders of bone density and structure, unspecified site: Secondary | ICD-10-CM | POA: Diagnosis not present

## 2018-11-03 DIAGNOSIS — M8588 Other specified disorders of bone density and structure, other site: Secondary | ICD-10-CM | POA: Diagnosis not present

## 2018-11-03 DIAGNOSIS — M85851 Other specified disorders of bone density and structure, right thigh: Secondary | ICD-10-CM | POA: Diagnosis not present

## 2019-01-10 ENCOUNTER — Other Ambulatory Visit: Payer: Self-pay | Admitting: Family Medicine

## 2019-01-16 ENCOUNTER — Ambulatory Visit: Payer: Medicare Other | Admitting: Cardiovascular Disease

## 2019-02-23 ENCOUNTER — Encounter: Payer: Self-pay | Admitting: Family Medicine

## 2019-02-23 ENCOUNTER — Encounter: Payer: Self-pay | Admitting: *Deleted

## 2019-02-23 ENCOUNTER — Ambulatory Visit (INDEPENDENT_AMBULATORY_CARE_PROVIDER_SITE_OTHER): Payer: Medicare Other | Admitting: Family Medicine

## 2019-02-23 ENCOUNTER — Other Ambulatory Visit: Payer: Self-pay

## 2019-02-23 VITALS — BP 132/85 | Ht 65.0 in | Wt 130.0 lb

## 2019-02-23 DIAGNOSIS — E559 Vitamin D deficiency, unspecified: Secondary | ICD-10-CM

## 2019-02-23 DIAGNOSIS — Z9181 History of falling: Secondary | ICD-10-CM | POA: Diagnosis not present

## 2019-02-23 DIAGNOSIS — N3090 Cystitis, unspecified without hematuria: Secondary | ICD-10-CM

## 2019-02-23 DIAGNOSIS — E785 Hyperlipidemia, unspecified: Secondary | ICD-10-CM | POA: Diagnosis not present

## 2019-02-23 DIAGNOSIS — E038 Other specified hypothyroidism: Secondary | ICD-10-CM | POA: Diagnosis not present

## 2019-02-23 DIAGNOSIS — Z7189 Other specified counseling: Secondary | ICD-10-CM | POA: Diagnosis not present

## 2019-02-23 NOTE — Progress Notes (Signed)
Virtual Visit via Telephone Note  I connected with Amanda Harmon on 02/23/19 at  1:20 PM EDT by telephone and verified that I am speaking with the correct person using two identifiers.  Location: Patient:home Provider: office   I discussed the limitations, risks, security and privacy concerns of performing an evaluation and management service by telephone and the availability of in person appointments. I also discussed with the patient that there may be a patient responsible charge related to this service. The patient expressed understanding and agreed to proceed.   History of Present Illness: F/U chronic health problems, review of medications, update medication Pt practicing social distancing, not mentally stressed, keeping well Denies recent fever or chills. Denies sinus pressure, nasal congestion, ear pain or sore throat. Denies chest congestion, productive cough or wheezing. Denies chest pains, palpitations and leg swelling Denies abdominal pain, nausea, vomiting,diarrhea or constipation.   Denies dysuria, frequency, hesitancy or incontinence. C/o right heel pain significant , has upcoming appt wit Podiatry , may get injection as she states conservative measures have failed. Denies headaches, seizures, numbness, or tingling. Denies depression, anxiety or insomnia. Denies skin break down or rash.       Observations/Objective: BP 132/85   Ht 5\' 5"  (1.651 m)   Wt 130 lb (59 kg)   BMI 21.63 kg/m  Good communication with no confusion and intact memory. Alert and oriented x 3 No signs of respiratory distress during sppech    Assessment and Plan: Dyslipidemia Hyperlipidemia:Low fat diet discussed and encouraged.   Lipid Panel  Lab Results  Component Value Date   CHOL 212 (H) 06/28/2018   HDL 59 06/28/2018   LDLCALC 134 (H) 06/28/2018   TRIG 89 06/28/2018   CHOLHDL 3.6 06/28/2018   Elects to rept lab in January , on an annual basis , will  follow    Hypothyroidism Followed by Endo on an annual basis, stable and controlled   At high risk for injury related to fall Fall precautions reviewed  Recurrent cystitis Episodic UTI associated with sexual intercourse, reviewed need to lubricate, drink water and vooid post intercourse to reduce frequency, does have antibiotics for use if needed  Educated About Covid-19 Virus Infection Covid-19 Education  The signs and symptoms of of COVID -19 were discussed with the patient and how to seek care for testing. ( follow up with PCP or arrange  E-visit) The importance of social  distancing is discussed today.      Follow Up Instructions:    I discussed the assessment and treatment plan with the patient. The patient was provided an opportunity to ask questions and all were answered. The patient agreed with the plan and demonstrated an understanding of the instructions.   The patient was advised to call back or seek an in-person evaluation if the symptoms worsen or if the condition fails to improve as anticipated.  I provided 22 minutes of non-face-to-face time during this encounter.   Tula Nakayama, MD

## 2019-02-23 NOTE — Patient Instructions (Signed)
Keep Wellness appointment  MD annual follow up in 1 year, call if you need me sooner  Fasting lipid and cBC will be ordered top be drawn in January at the same time that you are getting labs for Dr Dorris Fetch, please call in October for lab order to be sent to you, it is too soon to order now  Please do plan to get your mammogram this year, schedul please as  We discussed , this is due in October  Continue all of the practices that you are doing to reduce your risk of exposure to the cOVID 19 virus  Continue to focus on good health practices, regular exercise , good food choice and adequate rest  Social distancing.limit exposure to people you do not live with and maintain at least a 6 ft distance Frequent hand washing with soap and water Keeping your hands off of your face.wear a face mask in public These 3 practices will help to keep both you and your community healthy during this time. Please practice them faithfully!  Thanks for choosing [facility], we consider it a privelige to serve you.

## 2019-02-24 DIAGNOSIS — N3090 Cystitis, unspecified without hematuria: Secondary | ICD-10-CM | POA: Insufficient documentation

## 2019-02-24 DIAGNOSIS — Z7189 Other specified counseling: Secondary | ICD-10-CM | POA: Insufficient documentation

## 2019-02-24 NOTE — Assessment & Plan Note (Signed)
Followed by Endo on an annual basis, stable and controlled

## 2019-02-24 NOTE — Assessment & Plan Note (Signed)
Covid-19 Education  The signs and symptoms of of COVID -19 were discussed with the patient and how to seek care for testing. ( follow up with PCP or arrange  E-visit) The importance of social  distancing is discussed today.  

## 2019-02-24 NOTE — Assessment & Plan Note (Signed)
Episodic UTI associated with sexual intercourse, reviewed need to lubricate, drink water and vooid post intercourse to reduce frequency, does have antibiotics for use if needed

## 2019-02-24 NOTE — Assessment & Plan Note (Signed)
Fall precautions reviewed.

## 2019-02-24 NOTE — Assessment & Plan Note (Signed)
Hyperlipidemia:Low fat diet discussed and encouraged.   Lipid Panel  Lab Results  Component Value Date   CHOL 212 (H) 06/28/2018   HDL 59 06/28/2018   LDLCALC 134 (H) 06/28/2018   TRIG 89 06/28/2018   CHOLHDL 3.6 06/28/2018   Elects to rept lab in January , on an annual basis , will follow

## 2019-02-28 DIAGNOSIS — M722 Plantar fascial fibromatosis: Secondary | ICD-10-CM | POA: Diagnosis not present

## 2019-02-28 DIAGNOSIS — M79671 Pain in right foot: Secondary | ICD-10-CM | POA: Diagnosis not present

## 2019-03-28 ENCOUNTER — Other Ambulatory Visit: Payer: Self-pay

## 2019-03-28 ENCOUNTER — Other Ambulatory Visit: Payer: Medicare Other

## 2019-03-28 DIAGNOSIS — R6889 Other general symptoms and signs: Secondary | ICD-10-CM | POA: Diagnosis not present

## 2019-03-28 DIAGNOSIS — M722 Plantar fascial fibromatosis: Secondary | ICD-10-CM | POA: Diagnosis not present

## 2019-03-28 DIAGNOSIS — L851 Acquired keratosis [keratoderma] palmaris et plantaris: Secondary | ICD-10-CM | POA: Diagnosis not present

## 2019-03-28 DIAGNOSIS — Z20822 Contact with and (suspected) exposure to covid-19: Secondary | ICD-10-CM

## 2019-03-28 DIAGNOSIS — M79671 Pain in right foot: Secondary | ICD-10-CM | POA: Diagnosis not present

## 2019-03-31 LAB — NOVEL CORONAVIRUS, NAA: SARS-CoV-2, NAA: NOT DETECTED

## 2019-05-09 DIAGNOSIS — M722 Plantar fascial fibromatosis: Secondary | ICD-10-CM | POA: Diagnosis not present

## 2019-05-09 DIAGNOSIS — M79671 Pain in right foot: Secondary | ICD-10-CM | POA: Diagnosis not present

## 2019-05-24 NOTE — Progress Notes (Signed)
Chief Complaint  Patient presents with  . Follow-up    dizziness, tachycardia   History of Present Illness: 73 yo female with history of borderline hyperlipidemia who is here today for cardiac follow up. She had atypical chest pain in 2011 during a stressful situation and then other episodes of chest pain and dyspnea at rest in 2012. Echo 02/17/11 with normal LV systolic function, grade 2 diastolic dysfunction. Exercise stress test 02/17/11 with good exercise tolerance and no ischemic EKG changes. Her EKG chronically shows poor R wave progression in the precordial leads but this does not represent a prior infarct.   She is here today for follow up. The patient denies any chest pain, dyspnea, palpitations, lower extremity edema, orthopnea, PND. She has been on steroids recently for plantar fasciitis. She was walking yesterday and felt dizzy. Her heart rate was 130 bpm. No near syncope or syncope. No palpitations.   Primary care: Fayrene Helper, MD  Past Medical History:  Diagnosis Date  . 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 Cystitis   . Hyperlipidemia    borderline   . Mitral valve prolapse 1984  . Thyroid disease 2015   hypothyroidism  . Vertigo     Past Surgical History:  Procedure Laterality Date  . BACK SURGERY  2008  . BREAST BIOPSY  1998   left - benign  . CARPECTOMY HAND Right 06/22/2016  . COLONOSCOPY N/A 01/17/2015   Procedure: COLONOSCOPY;  Surgeon: Rogene Houston, MD;  [facility];  Service: Endoscopy;  Laterality: N/A;  830 - moved to 4/21 @ 8:30  . EYE SURGERY Left 12/01/2013   cataract  . EYE SURGERY Right 12/15/2013   cataract  . IR GENERIC HISTORICAL  08/19/2016   IR RADIOLOGIST EVAL & MGMT 08/19/2016 [facility]  . SPINE SURGERY    . TONSILLECTOMY  1965  . TUBAL LIGATION  1983    Current Outpatient Medications  Medication Sig Dispense Refill  . alendronate (FOSAMAX) 70 MG tablet TAKE 1 TABLET BY MOUTH WEEKLY WITH A FULL GLASS OF  WATER ON AN EMPTY STOMACH 12 tablet 2  . Ascorbic Acid (VITAMIN C) 1000 MG tablet Take 1,000 mg by mouth daily.    . Calcium Carbonate-Vit D-Min (CALCIUM 1200 PO) Take 1 tablet by mouth daily.    Marland Kitchen ketotifen (ZADITOR) 0.025 % ophthalmic solution Place 1 drop into both eyes every morning.    Marland Kitchen levothyroxine (SYNTHROID, LEVOTHROID) 50 MCG tablet Take 1 tablet (50 mcg total) by mouth every morning. 90 tablet 2  . meloxicam (MOBIC) 15 MG tablet Take 15 mg by mouth daily.    Marland Kitchen sulfamethoxazole-trimethoprim (BACTRIM DS,SEPTRA DS) 800-160 MG tablet TAKE 1 TABLET TWICE A DAY AS NEEDED 30 tablet 0  . Biotin 10000 MCG TABS Take 1 tablet by mouth daily.     No current facility-administered medications for this visit.     Allergies  Allergen Reactions  . Aspirin Other (See Comments)    Bruising  . [place] Extract] Rash    Social History   Socioeconomic History  . Marital status: Married    Spouse name: Chrissie Noa  . Number of children: 3  . Years of education: master's degree  . Highest education level: Master's degree (e.g., MA, MS, MEng, MEd, MSW, MBA)  Occupational History  . Occupation: Arts development officer: RETIRED  Social Needs  . Financial resource strain: Not hard at all  . Food insecurity  Worry: Never true    Inability: Never true  . Transportation needs    Medical: No    Non-medical: No  Tobacco Use  . Smoking status: Former Smoker    Packs/day: 1.00    Years: 4.00    Pack years: 4.00    Quit date: 12/15/1968    Years since quitting: 50.4  . Smokeless tobacco: Never Used  Substance and Sexual Activity  . Alcohol use: Yes    Comment: socially   . Drug use: No  . Sexual activity: Yes  Lifestyle  . Physical activity    Days per week: 5 days    Minutes per session: 60 min  . Stress: Not at all  Relationships  . Social connections    Talks on phone: More than three times a week    Gets together: More than three times a week     Attends religious service: 1 to 4 times per year    Active member of club or organization: Yes    Attends meetings of clubs or organizations: More than 4 times per year    Relationship status: Married  . Intimate partner violence    Fear of current or ex partner: No    Emotionally abused: No    Physically abused: No    Forced sexual activity: No  Other Topics Concern  . Not on file  Social History Narrative  . Not on file    Family History  Problem Relation Age of Onset  . Heart attack Mother 15       used tobacco   . Arthritis Mother        severe   . Heart attack Father 27       used tobacco   . Alcohol abuse Father   . Cancer Brother 1       prostate   . Osteoporosis Sister        severe   . Osteoarthritis Sister     Review of Systems:  As stated in the HPI and otherwise negative.   BP 134/70   Pulse 71   Ht 5\' 5"  (1.651 m)   Wt 133 lb 12.8 oz (60.7 kg)   SpO2 97%   BMI 22.27 kg/m   Physical Examination: General: Well developed, well nourished, NAD  HEENT: OP clear, mucus membranes moist  SKIN: warm, dry. No rashes. Neuro: No focal deficits  Musculoskeletal: Muscle strength 5/5 all ext  Psychiatric: Mood and affect normal  Neck: No JVD, right carotid bruit,  no thyromegaly, no lymphadenopathy.  Lungs:Clear bilaterally, no wheezes, rhonci, crackles Cardiovascular: Regular rate and rhythm. No murmurs, gallops or rubs. Abdomen:Soft. Bowel sounds present. Non-tender.  Extremities: No lower extremity edema. Pulses are 2 + in the bilateral DP/PT.  Echo 02/17/11: Left ventricle: The cavity size was normal. Wall thickness was normal. Systolic function was normal. The estimated ejection fraction was in the range of 55% to 60%. Wall motion was normal; there were no regional wall motion abnormalities. Features are consistent with a pseudonormal left ventricular filling pattern, with concomitant abnormal relaxation and increased filling pressure (grade  2 diastolic dysfunction). - Atrial septum: A patent foramen ovale cannot be excluded.  EKG:  EKG is ordered today. The ekg ordered today demonstrates NSR, rate 71 bpm.   Recent Labs: 06/28/2018: ALT 10; BUN 15; Creat 0.66; Hemoglobin 14.2; Platelets 226; Potassium 4.0; Sodium 141; TSH 2.97   Lipid Panel    Component Value Date/Time   CHOL 212 (H) 06/28/2018  0924   TRIG 89 06/28/2018 0924   HDL 59 06/28/2018 0924   CHOLHDL 3.6 06/28/2018 0924   VLDL 24 12/08/2016 0829   LDLCALC 134 (H) 06/28/2018 0924     Wt Readings from Last 3 Encounters:  05/25/19 133 lb 12.8 oz (60.7 kg)  02/23/19 130 lb (59 kg)  10/05/18 132 lb (59.9 kg)     Other studies Reviewed: Additional studies/ records that were reviewed today include: . Review of the above records demonstrates:   Assessment and Plan:   1. Abnormal EKG:  She had a normal stress test and echo in 2012. She has chronic EKG abnormalities. No chest pain. Will continue to follow.   2. Dizziness: Will arrange echo to assess LV systolic function.   3. Right carotid bruit: Will arrange carotid artery dopplers to exclude obstructive disease.   Current medicines are reviewed at length with the patient today.  The patient does not have concerns regarding medicines.  The following changes have been made:  no change  Labs/ tests ordered today include:   Orders Placed This Encounter  Procedures  . ECHOCARDIOGRAM COMPLETE  . VAS US CAROTID     Disposition:   FU with me in 24 months   Signed, Lauree Chandler, MD 05/25/2019 4:42 PM    [facility] [place], [place], [place]  [address] Phone: [phone]; Fax: [phone]

## 2019-05-25 ENCOUNTER — Ambulatory Visit (INDEPENDENT_AMBULATORY_CARE_PROVIDER_SITE_OTHER): Payer: Medicare Other | Admitting: Cardiovascular Disease

## 2019-05-25 ENCOUNTER — Encounter: Payer: Self-pay | Admitting: Cardiovascular Disease

## 2019-05-25 ENCOUNTER — Other Ambulatory Visit: Payer: Self-pay

## 2019-05-25 VITALS — BP 134/70 | HR 71 | Ht 65.0 in | Wt 133.8 lb

## 2019-05-25 DIAGNOSIS — R42 Dizziness and giddiness: Secondary | ICD-10-CM | POA: Diagnosis not present

## 2019-05-25 DIAGNOSIS — R0989 Other specified symptoms and signs involving the circulatory and respiratory systems: Secondary | ICD-10-CM

## 2019-05-25 DIAGNOSIS — R000 Tachycardia, unspecified: Secondary | ICD-10-CM | POA: Diagnosis not present

## 2019-05-25 DIAGNOSIS — E785 Hyperlipidemia, unspecified: Secondary | ICD-10-CM | POA: Diagnosis not present

## 2019-05-25 NOTE — Patient Instructions (Addendum)
Medication Instructions:  Your physician recommends that you continue on your current medications as directed. Please refer to the Current Medication list given to you today.  If you need a refill on your cardiac medications before your next appointment, please call your pharmacy.   Lab work: None If you have labs (blood work) drawn today and your tests are completely normal, you will receive your results only by: Marland Kitchen MyChart Message (if you have MyChart) OR . A paper copy in the mail If you have any lab test that is abnormal or we need to change your treatment, we will call you to review the results.  Testing/Procedures: Your physician has requested that you have an echocardiogram. Echocardiography is a painless test that uses sound waves to create images of your heart. It provides your doctor with information about the size and shape of your heart and how well your heart's chambers and valves are working. This procedure takes approximately one hour. There are no restrictions for this procedure.  Your physician has requested that you have a carotid duplex. This test is an ultrasound of the carotid arteries in your neck. It looks at blood flow through these arteries that supply the brain with blood. Allow one hour for this exam. There are no restrictions or special instructions.   Follow-Up: At [facility], you and your health needs are our priority.  As part of our continuing mission to provide you with exceptional heart care, we have created designated Provider Care Teams.  These Care Teams include your primary Cardiologist (physician) and Advanced Practice Providers (APPs -  Physician Assistants and Nurse Practitioners) who all work together to provide you with the care you need, when you need it. You will need a follow up appointment in 3 months (ok to use 1140A on 12/4).  Please call our office 2 months in advance to schedule this appointment.  You may see Dr. Angelena Form or one of the  following Advanced Practice Providers on your designated Care Team:   Wisner, PA-C Melina Copa, PA-C . Ermalinda Barrios, PA-C  Any Other Special Instructions Will Be Listed Below (If Applicable).

## 2019-06-12 ENCOUNTER — Ambulatory Visit (HOSPITAL_BASED_OUTPATIENT_CLINIC_OR_DEPARTMENT_OTHER): Payer: Medicare Other

## 2019-06-12 ENCOUNTER — Ambulatory Visit (HOSPITAL_COMMUNITY)
Admission: RE | Admit: 2019-06-12 | Discharge: 2019-06-12 | Disposition: A | Discharge status: Home / Self Care | Payer: Medicare Other | Source: Ambulatory Visit | Attending: Cardiology | Admitting: Cardiology

## 2019-06-12 ENCOUNTER — Other Ambulatory Visit: Payer: Self-pay

## 2019-06-12 DIAGNOSIS — E785 Hyperlipidemia, unspecified: Secondary | ICD-10-CM | POA: Insufficient documentation

## 2019-06-12 DIAGNOSIS — I313 Pericardial effusion (noninflammatory): Secondary | ICD-10-CM | POA: Insufficient documentation

## 2019-06-12 DIAGNOSIS — R0989 Other specified symptoms and signs involving the circulatory and respiratory systems: Secondary | ICD-10-CM | POA: Diagnosis not present

## 2019-06-12 DIAGNOSIS — R42 Dizziness and giddiness: Secondary | ICD-10-CM | POA: Diagnosis not present

## 2019-06-12 DIAGNOSIS — Z23 Encounter for immunization: Secondary | ICD-10-CM | POA: Diagnosis not present

## 2019-06-12 DIAGNOSIS — Z87891 Personal history of nicotine dependence: Secondary | ICD-10-CM | POA: Insufficient documentation

## 2019-06-12 DIAGNOSIS — R000 Tachycardia, unspecified: Secondary | ICD-10-CM

## 2019-06-20 DIAGNOSIS — M722 Plantar fascial fibromatosis: Secondary | ICD-10-CM | POA: Diagnosis not present

## 2019-06-20 DIAGNOSIS — M79671 Pain in right foot: Secondary | ICD-10-CM | POA: Diagnosis not present

## 2019-06-27 ENCOUNTER — Other Ambulatory Visit: Payer: Self-pay | Admitting: "Endocrinology

## 2019-07-17 ENCOUNTER — Ambulatory Visit: Payer: Medicare Other

## 2019-07-18 ENCOUNTER — Ambulatory Visit: Payer: Medicare Other | Admitting: Family Medicine

## 2019-07-18 ENCOUNTER — Encounter: Payer: Self-pay | Admitting: Family Medicine

## 2019-07-18 ENCOUNTER — Other Ambulatory Visit: Payer: Self-pay

## 2019-07-18 ENCOUNTER — Ambulatory Visit (INDEPENDENT_AMBULATORY_CARE_PROVIDER_SITE_OTHER): Payer: Medicare Other | Admitting: Family Medicine

## 2019-07-18 VITALS — BP 137/70 | Ht 67.0 in | Wt 133.0 lb

## 2019-07-18 DIAGNOSIS — Z0000 Encounter for general adult medical examination without abnormal findings: Secondary | ICD-10-CM

## 2019-07-18 NOTE — Patient Instructions (Addendum)
Amanda Harmon , Thank you for taking time to come for your Medicare Wellness Visit. I appreciate your ongoing commitment to your health goals. Please review the following plan we discussed and let me know if I can assist you in the future.   Please continue to practice social distancing to keep you, your family, and our community safe.  If you must go out, please wear a Mask and practice good handwashing.  Screening recommendations/referrals: Colonoscopy: Due 2026 you can talk to Dr. Melony Overly about whether or not you need to have this 1 Mammogram: Up-to-date Bone Density: Up-to-date, continue with your supplements. Recommended yearly ophthalmology/optometry visit for glaucoma screening and checkup Recommended yearly dental visit for hygiene and checkup  Vaccinations: Influenza vaccine: completed, 06/13/2019 Pneumococcal vaccine: completed Tdap vaccine: Due 2024 Shingles vaccine: check covearge  Advanced directives: Completed, wants nevertheless please bring [place] a copy so that we can copy it into your chart thank you.  Conditions/risks identified: Falls, can be a concern for everyone please just be mindful of your surroundings and step downs.  Next appointment: 02/19/2019    Preventive Care 73 Years and Older, Female Preventive care refers to lifestyle choices and visits with your health care provider that can promote health and wellness. What does preventive care include?  A yearly physical exam. This is also called an annual well check.  Dental exams once or twice a year.  Routine eye exams. Ask your health care provider how often you should have your eyes checked.  Personal lifestyle choices, including:  Daily care of your teeth and gums.  Regular physical activity.  Eating a healthy diet.  Avoiding tobacco and drug use.  Limiting alcohol use.  Practicing safe sex.  Taking low-dose aspirin every day.  Taking vitamin and mineral supplements as recommended by your health  care provider. What happens during an annual well check? The services and screenings done by your health care provider during your annual well check will depend on your age, overall health, lifestyle risk factors, and family history of disease. Counseling  Your health care provider may ask you questions about your:  Alcohol use.  Tobacco use.  Drug use.  Emotional well-being.  Home and relationship well-being.  Sexual activity.  Eating habits.  History of falls.  Memory and ability to understand (cognition).  Work and work Statistician.  Reproductive health. Screening  You may have the following tests or measurements:  Height, weight, and BMI.  Blood pressure.  Lipid and cholesterol levels. These may be checked every 5 years, or more frequently if you are over 71 years old.  Skin check.  Lung cancer screening. You may have this screening every year starting at age 90 if you have a 30-pack-year history of smoking and currently smoke or have quit within the past 15 years.  Fecal occult blood test (FOBT) of the stool. You may have this test every year starting at age 62.  Flexible sigmoidoscopy or colonoscopy. You may have a sigmoidoscopy every 5 years or a colonoscopy every 10 years starting at age 22.  Hepatitis C blood test.  Hepatitis B blood test.  Sexually transmitted disease (STD) testing.  Diabetes screening. This is done by checking your blood sugar (glucose) after you have not eaten for a while (fasting). You may have this done every 1-3 years.  Bone density scan. This is done to screen for osteoporosis. You may have this done starting at age 32.  Mammogram. This may be done every 1-2 years. Talk to  your health care provider about how often you should have regular mammograms. Talk with your health care provider about your test results, treatment options, and if necessary, the need for more tests. Vaccines  Your health care provider may recommend certain  vaccines, such as:  Influenza vaccine. This is recommended every year.  Tetanus, diphtheria, and acellular pertussis (Tdap, Td) vaccine. You may need a Td booster every 10 years.  Zoster vaccine. You may need this after age 50.  Pneumococcal 13-valent conjugate (PCV13) vaccine. One dose is recommended after age 63.  Pneumococcal polysaccharide (PPSV23) vaccine. One dose is recommended after age 49. Talk to your health care provider about which screenings and vaccines you need and how often you need them. This information is not intended to replace advice given to you by your health care provider. Make sure you discuss any questions you have with your health care provider. Document Released: 10/11/2015 Document Revised: 06/03/2016 Document Reviewed: 07/16/2015 Elsevier Interactive Patient Education  2017 [employer] Prevention in the Home Falls can cause injuries. They can happen to people of all ages. There are many things you can do to make your home safe and to help prevent falls. What can I do on the outside of my home?  Regularly fix the edges of walkways and driveways and fix any cracks.  Remove anything that might make you trip as you walk through a door, such as a raised step or threshold.  Trim any bushes or trees on the path to your home.  Use bright outdoor lighting.  Clear any walking paths of anything that might make someone trip, such as rocks or tools.  Regularly check to see if handrails are loose or broken. Make sure that both sides of any steps have handrails.  Any raised decks and porches should have guardrails on the edges.  Have any leaves, snow, or ice cleared regularly.  Use sand or salt on walking paths during winter.  Clean up any spills in your garage right away. This includes oil or grease spills. What can I do in the bathroom?  Use night lights.  Install grab bars by the toilet and in the tub and shower. Do not use towel bars as grab  bars.  Use non-skid mats or decals in the tub or shower.  If you need to sit down in the shower, use a plastic, non-slip stool.  Keep the floor dry. Clean up any water that spills on the floor as soon as it happens.  Remove soap buildup in the tub or shower regularly.  Attach bath mats securely with double-sided non-slip rug tape.  Do not have throw rugs and other things on the floor that can make you trip. What can I do in the bedroom?  Use night lights.  Make sure that you have a light by your bed that is easy to reach.  Do not use any sheets or blankets that are too big for your bed. They should not hang down onto the floor.  Have a firm chair that has side arms. You can use this for support while you get dressed.  Do not have throw rugs and other things on the floor that can make you trip. What can I do in the kitchen?  Clean up any spills right away.  Avoid walking on wet floors.  Keep items that you use a lot in easy-to-reach places.  If you need to reach something above you, use a strong step stool that has  a grab bar.  Keep electrical cords out of the way.  Do not use floor polish or wax that makes floors slippery. If you must use wax, use non-skid floor wax.  Do not have throw rugs and other things on the floor that can make you trip. What can I do with my stairs?  Do not leave any items on the stairs.  Make sure that there are handrails on both sides of the stairs and use them. Fix handrails that are broken or loose. Make sure that handrails are as long as the stairways.  Check any carpeting to make sure that it is firmly attached to the stairs. Fix any carpet that is loose or worn.  Avoid having throw rugs at the top or bottom of the stairs. If you do have throw rugs, attach them to the floor with carpet tape.  Make sure that you have a light switch at the top of the stairs and the bottom of the stairs. If you do not have them, ask someone to add them for  you. What else can I do to help prevent falls?  Wear shoes that:  Do not have high heels.  Have rubber bottoms.  Are comfortable and fit you well.  Are closed at the toe. Do not wear sandals.  If you use a stepladder:  Make sure that it is fully opened. Do not climb a closed stepladder.  Make sure that both sides of the stepladder are locked into place.  Ask someone to hold it for you, if possible.  Clearly mark and make sure that you can see:  Any grab bars or handrails.  First and last steps.  Where the edge of each step is.  Use tools that help you move around (mobility aids) if they are needed. These include:  Canes.  Walkers.  Scooters.  Crutches.  Turn on the lights when you go into a dark area. Replace any light bulbs as soon as they burn out.  Set up your furniture so you have a clear path. Avoid moving your furniture around.  If any of your floors are uneven, fix them.  If there are any pets around you, be aware of where they are.  Review your medicines with your doctor. Some medicines can make you feel dizzy. This can increase your chance of falling. Ask your doctor what other things that you can do to help prevent falls. This information is not intended to replace advice given to you by your health care provider. Make sure you discuss any questions you have with your health care provider. Document Released: 07/11/2009 Document Revised: 02/20/2016 Document Reviewed: 10/19/2014 Elsevier Interactive Patient Education  2017 [employer].

## 2019-07-18 NOTE — Progress Notes (Signed)
Subjective:   Amanda Harmon is a 73 y.o. female who presents for Medicare Annual (Subsequent) preventive examination.  Location of Patient: Home Location of Provider: Telehealth Consent was obtain for visit to be over via telehealth.  I verified that I am speaking with the correct person using two identifiers.   Review of Systems:   Cardiac Risk Factors include: advanced age (>45men, >52 women);hypertension     Objective:     Vitals: BP 137/70   Ht 5\' 7"  (1.702 m)   Wt 133 lb (60.3 kg)   BMI 20.83 kg/m   Body mass index is 20.83 kg/m.  Advanced Directives 07/18/2019 04/27/2017 04/13/2017 12/15/2016 08/31/2016 02/20/2015 01/17/2015  Does Patient Have a Medical Advance Directive? Yes No No No No No No  Type of Paramedic of [place];Living will - - - - - -  Copy of [place] in Chart? No - copy requested - - - - - -  Would patient like information on creating a medical advance directive? - No - Patient declined No - Patient declined Yes (MAU/Ambulatory/Procedural Areas - Information given) No - Patient declined No - patient declined information No - patient declined information    Tobacco Social History   Tobacco Use  Smoking Status Former Smoker  . Packs/day: 1.00  . Years: 4.00  . Pack years: 4.00  . Quit date: 12/15/1968  . Years since quitting: 50.6  Smokeless Tobacco Never Used     Counseling given: Not Answered   Clinical Intake:  Pre-visit preparation completed: No  Pain : No/denies pain     Nutritional Status: BMI of 19-24  Normal Diabetes: No  How often do you need to have someone help you when you read instructions, pamphlets, or other written materials from your doctor or pharmacy?: 1 - Never What is the last grade level you completed in school?: 19  Interpreter Needed?: No     Past Medical History:  Diagnosis Date  . 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 Cystitis   . Hyperlipidemia    borderline   . Mitral valve prolapse 1984  . Thyroid disease 2015   hypothyroidism  . Vertigo    Past Surgical History:  Procedure Laterality Date  . BACK SURGERY  2008  . BREAST BIOPSY  1998   left - benign  . CARPECTOMY HAND Right 06/22/2016  . COLONOSCOPY N/A 01/17/2015   Procedure: COLONOSCOPY;  Surgeon: Rogene Houston, MD;  Location: [facility];  Service: Endoscopy;  Laterality: N/A;  830 - moved to 4/21 @ 8:30  . EYE SURGERY Left 12/01/2013   cataract  . EYE SURGERY Right 12/15/2013   cataract  . IR GENERIC HISTORICAL  08/19/2016   IR RADIOLOGIST EVAL & MGMT 08/19/2016 [facility]  . SPINE SURGERY    . TONSILLECTOMY  1965  . TUBAL LIGATION  1983   Family History  Problem Relation Age of Onset  . Heart attack Mother 40       used tobacco   . Arthritis Mother        severe   . Heart attack Father 24       used tobacco   . Alcohol abuse Father   . Cancer Brother 53       prostate   . Osteoporosis Sister        severe   . Osteoarthritis Sister    Social History   Socioeconomic History  . Marital status:  Married    Spouse name: Amanda Harmon  . Number of children: 3  . Years of education: master's degree  . Highest education level: Master's degree (e.g., MA, MS, MEng, MEd, MSW, MBA)  Occupational History  . Occupation: Arts development officer: RETIRED  Social Needs  . Financial resource strain: Not hard at all  . Food insecurity    Worry: Never true    Inability: Never true  . Transportation needs    Medical: No    Non-medical: No  Tobacco Use  . Smoking status: Former Smoker    Packs/day: 1.00    Years: 4.00    Pack years: 4.00    Quit date: 12/15/1968    Years since quitting: 50.6  . Smokeless tobacco: Never Used  Substance and Sexual Activity  . Alcohol use: Yes    Comment: socially   . Drug use: No  . Sexual activity: Yes  Lifestyle  . Physical activity    Days per week: 5 days    Minutes per session: 60 min  . Stress: Not at  all  Relationships  . Social connections    Talks on phone: More than three times a week    Gets together: More than three times a week    Attends religious service: 1 to 4 times per year    Active member of club or organization: Yes    Attends meetings of clubs or organizations: More than 4 times per year    Relationship status: Married  Other Topics Concern  . Not on file  Social History Narrative  . Not on file    Outpatient Encounter Medications as of 07/18/2019  Medication Sig  . alendronate (FOSAMAX) 70 MG tablet TAKE 1 TABLET BY MOUTH WEEKLY WITH A FULL GLASS OF WATER ON AN EMPTY STOMACH  . Ascorbic Acid (VITAMIN C) 1000 MG tablet Take 1,000 mg by mouth daily.  . Biotin 10000 MCG TABS Take 1 tablet by mouth daily.  . Calcium Carbonate-Vit D-Min (CALCIUM 1200 PO) Take 1 tablet by mouth daily.  Marland Kitchen ketotifen (ZADITOR) 0.025 % ophthalmic solution Place 1 drop into both eyes every morning.  Marland Kitchen levothyroxine (SYNTHROID) 50 MCG tablet TAKE 1 TABLET BY MOUTH EVERY DAY IN THE MORNING  . meloxicam (MOBIC) 15 MG tablet Take 15 mg by mouth daily.  Marland Kitchen sulfamethoxazole-trimethoprim (BACTRIM DS,SEPTRA DS) 800-160 MG tablet TAKE 1 TABLET TWICE A DAY AS NEEDED   No facility-administered encounter medications on file as of 07/18/2019.     Activities of Daily Living In your present state of health, do you have any difficulty performing the following activities: 07/18/2019  Hearing? N  Vision? N  Difficulty concentrating or making decisions? N  Walking or climbing stairs? N  Dressing or bathing? N  Doing errands, shopping? N  Some recent data might be hidden    Patient Care Team: Amanda Helper, MD as PCP - General (Family Medicine) Amanda Planas, MD as Consulting Physician (Orthopedic Surgery) Amanda Blanks, MD as Consulting Physician (Cardiology)    Assessment:   This is a routine wellness examination for Amanda Harmon.  Exercise Activities and Dietary recommendations  Current Exercise Habits: Home exercise routine, Type of exercise: walking, Time (Minutes): 60, Frequency (Times/Week): 5, Weekly Exercise (Minutes/Week): 300, Intensity: Mild  Goals    . Exercise strength training     Patient would like to start strength training 3 days a week for 30 minutes at a time.     . Patient Stated  Patient would like to go through closets in her home and organize and clean out        Fall Risk Fall Risk  07/18/2019 02/23/2019 08/15/2018 07/13/2018 08/24/2017  Falls in the past year? 0 0 0 No No  Comment - - Emmi Telephone Survey: data to providers prior to load - -  Number falls in past yr: 0 0 - - -  Injury with Fall? 0 0 - - -  Risk for fall due to : - - - - -  Follow up Falls evaluation completed;Education provided - - - -  Comment - - - - -   Is the patient's home free of loose throw rugs in walkways, pet beds, electrical cords, etc?   yes      Grab bars in the bathroom? yes      Handrails on the stairs?   yes      Adequate lighting?   yes  Depression Screen PHQ 2/9 Scores 07/18/2019 02/23/2019 07/13/2018 10/20/2017  PHQ - 2 Score 0 0 0 0  PHQ- 9 Score - - - 0     Cognitive Function     6CIT Screen 07/13/2018 12/15/2016  What Year? 0 points 0 points  What month? 0 points 0 points  What time? 0 points 0 points  Count back from 20 0 points 0 points  Months in reverse 0 points 0 points  Repeat phrase 0 points 0 points  Total Score 0 0    Immunization History  Administered Date(s) Administered  . Influenza Split 06/17/2014, 07/23/2017  . Influenza, High Dose Seasonal PF 07/05/2018  . Influenza,inj,Quad PF,6+ Mos 07/20/2013, 07/01/2015, 07/15/2016  . Pneumococcal Conjugate-13 10/30/2014  . Pneumococcal Polysaccharide-23 03/23/2012  . Tdap 12/09/2012  . Zoster 02/24/2007    Qualifies for Shingles Vaccine? Completed, would like shingrix  Screening Tests Health Maintenance  Topic Date Due  . MAMMOGRAM  06/27/2020  . TETANUS/TDAP   12/10/2022  . COLONOSCOPY  01/16/2025  . INFLUENZA VACCINE  Completed  . DEXA SCAN  Completed  . Hepatitis C Screening  Completed  . PNA vac Low Risk Adult  Completed  . URINE MICROALBUMIN  Discontinued    Cancer Screenings: Lung: Low Dose CT Chest recommended if Age 38-80 years, 30 pack-year currently smoking OR have quit w/in 15years. Patient does not qualify. Breast:  Up to date on Mammogram? Yes   Up to date of Bone Density/Dexa? Yes Colorectal: Due 2026  Additional Screenings:  Hepatitis C Screening: completed     Plan:      1. Encounter for Medicare annual wellness exam   I have personally reviewed and noted the following in the patient's chart:   . Medical and social history . Use of alcohol, tobacco or illicit drugs  . Current medications and supplements . Functional ability and status . Nutritional status . Physical activity . Advanced directives . List of other physicians . Hospitalizations, surgeries, and ER visits in previous 12 months . Vitals . Screenings to include cognitive, depression, and falls . Referrals and appointments  In addition, I have reviewed and discussed with patient certain preventive protocols, quality metrics, and best practice recommendations. A written personalized care plan for preventive services as well as general preventive health recommendations were provided to patient.     I provided 20 minutes of non-face-to-face time during this encounter.   Perlie Mayo, NP  07/18/2019

## 2019-08-30 NOTE — Progress Notes (Signed)
Chief Complaint  Patient presents with   Follow-up    Dizziness   History of Present Illness: 73 yo female with history of borderline hyperlipidemia who is here today for cardiac follow up. She had atypical chest pain in 2011 during a stressful situation and then other episodes of chest pain and dyspnea at rest in 2012. Echo 02/17/11 with normal LV systolic function, grade 2 diastolic dysfunction. Exercise stress test 02/17/11 with good exercise tolerance and no ischemic EKG changes. Her EKG chronically shows poor R wave progression in the precordial leads but this does not represent a prior infarct. She was seen in the office 05/25/19 with c/o dizziness and rapid heart rates. Echo 06/12/19 with LVEF=60-65%, no valve disease, small circumferential pericardial effusion. Carotid artery dopplers September 2020 with no evidence of carotid artery disease.   She is here today for follow up. The patient denies any chest pain, dyspnea, lower extremity edema, orthopnea, PND, dizziness, near syncope or syncope. No recurrent dizziness or palpitations.   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    Cystitis    Hyperlipidemia    borderline    Mitral valve prolapse 1984   Thyroid disease 2015   hypothyroidism   Vertigo     Past Surgical History:  Procedure Laterality Date   BACK SURGERY  2008   BREAST BIOPSY  1998   left - benign   CARPECTOMY HAND Right 06/22/2016   COLONOSCOPY N/A 01/17/2015   Procedure: COLONOSCOPY;  Surgeon: Rogene Houston, MD;  [facility];  Service: Endoscopy;  Laterality: N/A;  830 - moved to 4/21 @ 8:30   EYE SURGERY Left 12/01/2013   cataract   EYE SURGERY Right 12/15/2013   cataract   IR GENERIC HISTORICAL  08/19/2016   IR RADIOLOGIST EVAL & MGMT 08/19/2016 [facility]   SPINE SURGERY     TONSILLECTOMY  1965   TUBAL LIGATION  1983    Current Outpatient  Medications  Medication Sig Dispense Refill   alendronate (FOSAMAX) 70 MG tablet TAKE 1 TABLET BY MOUTH WEEKLY WITH A FULL GLASS OF WATER ON AN EMPTY STOMACH 12 tablet 2   Ascorbic Acid (VITAMIN C) 1000 MG tablet Take 1,000 mg by mouth daily.     Calcium Carbonate-Vit D-Min (CALCIUM 1200 PO) Take 1 tablet by mouth daily.     ketotifen (ZADITOR) 0.025 % ophthalmic solution Place 1 drop into both eyes every morning.     levothyroxine (SYNTHROID) 50 MCG tablet TAKE 1 TABLET BY MOUTH EVERY DAY IN THE MORNING 90 tablet 2   sulfamethoxazole-trimethoprim (BACTRIM DS,SEPTRA DS) 800-160 MG tablet TAKE 1 TABLET TWICE A DAY AS NEEDED 30 tablet 0   No current facility-administered medications for this visit.     Allergies  Allergen Reactions   Aspirin Other (See Comments)    Bruising   Poison Oak Extract [Poison Oak Extract] Rash    Social History   Socioeconomic History   Marital status: Married    Spouse name: Chrissie Noa   Number of children: 3   Years of education: master's degree   Highest education level: Master's degree (e.g., MA, MS, MEng, MEd, MSW, MBA)  Occupational History   Occupation: Arts development officer: RETIRED  Social Designer, fashion/clothing strain: Not hard at all   Food insecurity    Worry: Never true    Inability: Never true  Transportation needs    Medical: No    Non-medical: No  Tobacco Use   Smoking status: Former Smoker    Packs/day: 1.00    Years: 4.00    Pack years: 4.00    Quit date: 12/15/1968    Years since quitting: 50.7   Smokeless tobacco: Never Used  Substance and Sexual Activity   Alcohol use: Yes    Comment: socially    Drug use: No   Sexual activity: Yes  Lifestyle   Physical activity    Days per week: 5 days    Minutes per session: 60 min   Stress: Not at all  Relationships   Social connections    Talks on phone: More than three times a week    Gets together: More than three times a week     Attends religious service: 1 to 4 times per year    Active member of club or organization: Yes    Attends meetings of clubs or organizations: More than 4 times per year    Relationship status: Married   Intimate partner violence    Fear of current or ex partner: No    Emotionally abused: No    Physically abused: No    Forced sexual activity: No  Other Topics Concern   Not on file  Social History Narrative   Not on file    Family History  Problem Relation Age of Onset   Heart attack Mother 26       used tobacco    Arthritis Mother        severe    Heart attack Father 37       used tobacco    Alcohol abuse Father    Cancer Brother 23       prostate    Osteoporosis Sister        severe    Osteoarthritis Sister     Review of Systems:  As stated in the HPI and otherwise negative.   BP (!) 154/88    Pulse 83    Ht 5\' 7"  (1.702 m)    Wt 133 lb 12.8 oz (60.7 kg)    SpO2 97%    BMI 20.96 kg/m   Physical Examination: General: Well developed, well nourished, NAD  HEENT: OP clear, mucus membranes moist  SKIN: warm, dry. No rashes. Neuro: No focal deficits  Musculoskeletal: Muscle strength 5/5 all ext  Psychiatric: Mood and affect normal  Neck: No JVD, no carotid bruits, no thyromegaly, no lymphadenopathy.  Lungs:Clear bilaterally, no wheezes, rhonci, crackles Cardiovascular: Regular rate and rhythm. No murmurs, gallops or rubs. Abdomen:Soft. Bowel sounds present. Non-tender.  Extremities: No lower extremity edema. Pulses are 2 + in the bilateral DP/PT.  Echo 06/12/19:  1. The left ventricle has normal systolic function with an ejection fraction of 60-65%. The cavity size was normal. Left ventricular diastolic parameters were normal. No evidence of left ventricular regional wall motion abnormalities.  2. The right ventricle has normal systolic function. The cavity was normal. There is no increase in right ventricular wall thickness. Right ventricular systolic pressure  is normal.  3. Small pericardial effusion.  4. The pericardial effusion is circumferential.  5. No evidence of mitral valve stenosis.  6. The aortic valve is tricuspid. No stenosis of the aortic valve.  7. The aorta is normal unless otherwise noted.  8. The aortic root, ascending aorta and aortic arch are normal in size and structure.  EKG:  EKG is not ordered today.  The ekg ordered today demonstrates .   Recent Labs: No results found for requested labs within last 8760 hours.   Lipid Panel    Component Value Date/Time   CHOL 212 (H) 06/28/2018 0924   TRIG 89 06/28/2018 0924   HDL 59 06/28/2018 0924   CHOLHDL 3.6 06/28/2018 0924   VLDL 24 12/08/2016 0829   LDLCALC 134 (H) 06/28/2018 0924     Wt Readings from Last 3 Encounters:  09/01/19 133 lb 12.8 oz (60.7 kg)  07/18/19 133 lb (60.3 kg)  05/25/19 133 lb 12.8 oz (60.7 kg)     Other studies Reviewed: Additional studies/ records that were reviewed today include: . Review of the above records demonstrates:   Assessment and Plan:   1. Dizziness: Normal LV systolic function on echo September 2020.  No evidence of carotid artery disease.   2. HTN: BP has been elevated at home over the past several days. This has not been an issue in the past. She will trend her BP over the next 10 days and let us know. We discussed starting Norvasc if her BP remains elevated.   Current medicines are reviewed at length with the patient today.  The patient does not have concerns regarding medicines.  The following changes have been made:  no change  Labs/ tests ordered today include:   No orders of the defined types were placed in this encounter.    Disposition:   FU with me in 12 months   Signed, Lauree Chandler, MD 09/01/2019 12:05 PM    [address], [place], [place]  [address] Phone: [phone]; Fax: [phone]

## 2019-08-31 ENCOUNTER — Telehealth: Payer: Self-pay | Admitting: Cardiovascular Disease

## 2019-08-31 NOTE — Telephone Encounter (Signed)
The patient is calling because she would like to change her OV to a Virtual visit with Dr Angelena Form on 12/4 @ 11:40 am.  Please call to confirm, if ok.  Thank you.

## 2019-08-31 NOTE — Telephone Encounter (Signed)
Patient would like appt with Dr. Angelena Form tomorrow at 11:40 to be virtual. Please advise.

## 2019-09-01 ENCOUNTER — Ambulatory Visit (INDEPENDENT_AMBULATORY_CARE_PROVIDER_SITE_OTHER): Payer: Medicare Other | Admitting: Cardiovascular Disease

## 2019-09-01 ENCOUNTER — Other Ambulatory Visit: Payer: Self-pay

## 2019-09-01 ENCOUNTER — Encounter: Payer: Self-pay | Admitting: Cardiovascular Disease

## 2019-09-01 VITALS — BP 154/88 | HR 83 | Ht 67.0 in | Wt 133.8 lb

## 2019-09-01 DIAGNOSIS — R42 Dizziness and giddiness: Secondary | ICD-10-CM

## 2019-09-01 DIAGNOSIS — I10 Essential (primary) hypertension: Secondary | ICD-10-CM

## 2019-09-01 NOTE — Telephone Encounter (Signed)
Did not see this message in time to contact patient. She is now arrived.

## 2019-09-01 NOTE — Patient Instructions (Addendum)
Medication Instructions:  none *If you need a refill on your cardiac medications before your next appointment, please call your pharmacy*  Lab Work: none If you have labs (blood work) drawn today and your tests are completely normal, you will receive your results only by: Marland Kitchen MyChart Message (if you have MyChart) OR . A paper copy in the mail If you have any lab test that is abnormal or we need to change your treatment, we will call you to review the results.  Testing/Procedures: none  Follow-Up: At [facility], you and your health needs are our priority.  As part of our continuing mission to provide you with exceptional heart care, we have created designated Provider Care Teams.  These Care Teams include your primary Cardiologist (physician) and Advanced Practice Providers (APPs -  Physician Assistants and Nurse Practitioners) who all work together to provide you with the care you need, when you need it.  Your next appointment:   12 month(s)  The format for your next appointment:   In Person  Provider:   Lauree Chandler, MD  Other Instructions  Please monitor blood pressure at home and call or send a MyChart message with your readings in about 2 weeks.

## 2019-09-14 ENCOUNTER — Other Ambulatory Visit: Payer: Self-pay | Admitting: "Endocrinology

## 2019-09-14 DIAGNOSIS — M810 Age-related osteoporosis without current pathological fracture: Secondary | ICD-10-CM

## 2019-10-02 ENCOUNTER — Telehealth: Payer: Self-pay | Admitting: "Endocrinology

## 2019-10-02 DIAGNOSIS — E559 Vitamin D deficiency, unspecified: Secondary | ICD-10-CM

## 2019-10-02 DIAGNOSIS — E039 Hypothyroidism, unspecified: Secondary | ICD-10-CM

## 2019-10-02 NOTE — Telephone Encounter (Signed)
Can you update the lab order, pt is going today for labs

## 2019-10-02 NOTE — Telephone Encounter (Signed)
Lab orders sent

## 2019-10-03 ENCOUNTER — Telehealth: Payer: Self-pay

## 2019-10-03 ENCOUNTER — Encounter: Payer: Self-pay | Admitting: Family Medicine

## 2019-10-03 DIAGNOSIS — E039 Hypothyroidism, unspecified: Secondary | ICD-10-CM | POA: Diagnosis not present

## 2019-10-03 DIAGNOSIS — E559 Vitamin D deficiency, unspecified: Secondary | ICD-10-CM | POA: Diagnosis not present

## 2019-10-03 DIAGNOSIS — E785 Hyperlipidemia, unspecified: Secondary | ICD-10-CM

## 2019-10-03 LAB — CBC
HCT: 43.6 % (ref 35.0–45.0)
Hemoglobin: 14.6 g/dL (ref 11.7–15.5)
MCH: 30.2 pg (ref 27.0–33.0)
MCHC: 33.5 g/dL (ref 32.0–36.0)
MCV: 90.3 fL (ref 80.0–100.0)
MPV: 10.1 fL (ref 7.5–12.5)
Platelets: 243 10*3/uL (ref 140–400)
RBC: 4.83 10*6/uL (ref 3.80–5.10)
RDW: 12.3 % (ref 11.0–15.0)
WBC: 5.4 10*3/uL (ref 3.8–10.8)

## 2019-10-03 LAB — LIPID PANEL
Cholesterol: 222 mg/dL — ABNORMAL HIGH (ref <200)
HDL: 66 mg/dL (ref >=50)
LDL Cholesterol (Calc): 135 mg/dL (calc) — ABNORMAL HIGH
Non-HDL Cholesterol (Calc): 156 mg/dL (calc) — ABNORMAL HIGH (ref <130)
Total CHOL/HDL Ratio: 3.4 (calc) (ref <5.0)
Triglycerides: 106 mg/dL (ref <150)

## 2019-10-03 NOTE — Telephone Encounter (Signed)
Labs ordered.

## 2019-10-04 LAB — COMPLETE METABOLIC PANEL WITH GFR
AG Ratio: 2.1 (calc) (ref 1.0–2.5)
ALT: 11 U/L (ref 6–29)
AST: 17 U/L (ref 10–35)
Albumin: 4.2 g/dL (ref 3.6–5.1)
Alkaline phosphatase (APISO): 61 U/L (ref 37–153)
BUN: 16 mg/dL (ref 7–25)
CO2: 33 mmol/L — ABNORMAL HIGH (ref 20–32)
Calcium: 9.4 mg/dL (ref 8.6–10.4)
Chloride: 105 mmol/L (ref 98–110)
Creat: 0.61 mg/dL (ref 0.60–0.93)
GFR, Est African American: 104 mL/min/{1.73_m2} (ref >=60)
GFR, Est Non African American: 90 mL/min/{1.73_m2} (ref >=60)
Globulin: 2 g/dL (calc) (ref 1.9–3.7)
Glucose, Bld: 95 mg/dL (ref 65–99)
Potassium: 4.9 mmol/L (ref 3.5–5.3)
Sodium: 144 mmol/L (ref 135–146)
Total Bilirubin: 0.6 mg/dL (ref 0.2–1.2)
Total Protein: 6.2 g/dL (ref 6.1–8.1)

## 2019-10-04 LAB — TSH: TSH: 2.62 mIU/L (ref 0.40–4.50)

## 2019-10-04 LAB — VITAMIN D 25 HYDROXY (VIT D DEFICIENCY, FRACTURES): Vit D, 25-Hydroxy: 40 ng/mL (ref 30–100)

## 2019-10-04 LAB — T4, FREE: Free T4: 1 ng/dL (ref 0.8–1.8)

## 2019-10-06 ENCOUNTER — Other Ambulatory Visit: Payer: Self-pay

## 2019-10-06 ENCOUNTER — Ambulatory Visit (INDEPENDENT_AMBULATORY_CARE_PROVIDER_SITE_OTHER): Payer: Medicare Other | Admitting: "Endocrinology

## 2019-10-06 ENCOUNTER — Encounter: Payer: Self-pay | Admitting: "Endocrinology

## 2019-10-06 DIAGNOSIS — E559 Vitamin D deficiency, unspecified: Secondary | ICD-10-CM

## 2019-10-06 DIAGNOSIS — E039 Hypothyroidism, unspecified: Secondary | ICD-10-CM | POA: Diagnosis not present

## 2019-10-06 DIAGNOSIS — M810 Age-related osteoporosis without current pathological fracture: Secondary | ICD-10-CM

## 2019-10-06 NOTE — Progress Notes (Signed)
10/06/2019                                Endocrinology Telehealth Visit Follow up Note -During COVID -19 Pandemic  I connected with Amanda Harmon on 10/06/2019   by telephone and verified that I am speaking with the correct person using two identifiers. Amanda Harmon, [birth year]. she has verbally consented to this visit. All issues noted in this document were discussed and addressed. The format was not optimal for physical exam.    Subjective:    Patient ID: Amanda Harmon, female    DOB: 1946/06/15, PCP Amanda Helper, MD   Past Medical History:  Diagnosis Date  . 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 Cystitis   . Hyperlipidemia    borderline   . Mitral valve prolapse 1984  . Thyroid disease 2015   hypothyroidism  . Vertigo    Past Surgical History:  Procedure Laterality Date  . BACK SURGERY  2008  . BREAST BIOPSY  1998   left - benign  . CARPECTOMY HAND Right 06/22/2016  . COLONOSCOPY N/A 01/17/2015   Procedure: COLONOSCOPY;  Surgeon: Rogene Houston, MD;  [facility];  Service: Endoscopy;  Laterality: N/A;  830 - moved to 4/21 @ 8:30  . EYE SURGERY Left 12/01/2013   cataract  . EYE SURGERY Right 12/15/2013   cataract  . IR GENERIC HISTORICAL  08/19/2016   IR RADIOLOGIST EVAL & MGMT 08/19/2016 [facility]  . SPINE SURGERY    . TONSILLECTOMY  1965  . TUBAL LIGATION  1983   Social History   Socioeconomic History  . Marital status: Married    Spouse name: Amanda Harmon  . Number of children: 3  . Years of education: master's degree  . Highest education level: Master's degree (e.g., MA, MS, MEng, MEd, MSW, MBA)  Occupational History  . Occupation: Arts development officer: RETIRED  Tobacco Use  . Smoking status: Former Smoker    Packs/day: 1.00    Years: 4.00    Pack years: 4.00    Quit date: 12/15/1968    Years since quitting: 50.8  . Smokeless tobacco: Never Used  Substance and Sexual Activity  . Alcohol use: Yes     Comment: socially   . Drug use: No  . Sexual activity: Yes  Other Topics Concern  . Not on file  Social History Narrative  . Not on file   Social Determinants of Health   Financial Resource Strain:   . Difficulty of Paying Living Expenses: Not on file  Food Insecurity:   . Worried About Charity fundraiser in the Last Year: Not on file  . Ran Out of Food in the Last Year: Not on file  Transportation Needs:   . Lack of Transportation (Medical): Not on file  . Lack of Transportation (Non-Medical): Not on file  Physical Activity:   . Days of Exercise per Week: Not on file  . Minutes of Exercise per Session: Not on file  Stress:   . Feeling of Stress : Not on file  Social Connections:   . Frequency of Communication with Friends and Family: Not on file  . Frequency of Social Gatherings with Friends and Family: Not on file  . Attends Religious Services: Not on file  . Active Member of Clubs or Organizations: Not on file  . Attends Archivist  Meetings: Not on file  . Marital Status: Not on file   Outpatient Encounter Medications as of 10/06/2019  Medication Sig  . alendronate (FOSAMAX) 70 MG tablet TAKE 1 TABLET BY MOUTH WEEKLY WITH A FULL GLASS OF WATER ON AN EMPTY STOMACH  . Ascorbic Acid (VITAMIN C) 1000 MG tablet Take 1,000 mg by mouth daily.  . Calcium Carbonate-Vit D-Min (CALCIUM 1200 PO) Take 1 tablet by mouth daily.  Marland Kitchen ketotifen (ZADITOR) 0.025 % ophthalmic solution Place 1 drop into both eyes every morning.  Marland Kitchen levothyroxine (SYNTHROID) 50 MCG tablet TAKE 1 TABLET BY MOUTH EVERY DAY IN THE MORNING  . [DISCONTINUED] sulfamethoxazole-trimethoprim (BACTRIM DS,SEPTRA DS) 800-160 MG tablet TAKE 1 TABLET TWICE A DAY AS NEEDED   No facility-administered encounter medications on file as of 10/06/2019.   ALLERGIES: Allergies  Allergen Reactions  . Aspirin Other (See Comments)    Bruising  . Poison Oak Extract [Poison Oak Extract] Rash   VACCINATION STATUS: Immunization  History  Administered Date(s) Administered  . Influenza Split 06/17/2014, 07/23/2017  . Influenza, High Dose Seasonal PF 07/05/2018  . Influenza,inj,Quad PF,6+ Mos 07/20/2013, 07/01/2015, 07/15/2016  . Pneumococcal Conjugate-13 10/30/2014  . Pneumococcal Polysaccharide-23 03/23/2012  . Tdap 12/09/2012  . Zoster 02/24/2007    HPI  74  yr old female with medical hx as above. She is being engaged in telehealth via telephone for her hypothyroidism and osteoporosis with repeat  DXA scan and  TFTs. For hypothyroidism, she was put on levothyroxine 50 mcg po qam, she is tolerating well. - she remains on alendronate 70 mg by mouth weekly by Dr. Moshe Cipro her primary medical doctor.  She has history of hand fracture 2 years ago.  Currently no interval problems, no further falls.   -Her DXA scan in Feb 2020 was c/w clinically significant improvement in the BMD of the spine, stable BMD in hip.  - She has history of compression fracture of first lumbar vertebra. No recent height change. She denies hx of goiter. Her last menstrual period was at age 80. she has 3 grown children. she smoked for 4 years in college. she never took steroids on chronic basis. she denies cold/heat intolerance. she is active , exercises regularly.   Review of Systems Limited as above.  Objective:    There were no vitals taken for this visit.  Wt Readings from Last 3 Encounters:  09/01/19 133 lb 12.8 oz (60.7 kg)  07/18/19 133 lb (60.3 kg)  05/25/19 133 lb 12.8 oz (60.7 kg)    Physical Exam   CMP     Component Value Date/Time   NA 144 10/03/2019 0932   K 4.9 10/03/2019 0932   CL 105 10/03/2019 0932   CO2 33 (H) 10/03/2019 0932   GLUCOSE 95 10/03/2019 0932   BUN 16 10/03/2019 0932   CREATININE 0.61 10/03/2019 0932   CALCIUM 9.4 10/03/2019 0932   PROT 6.2 10/03/2019 0932   ALBUMIN 4.0 12/08/2016 0826   AST 17 10/03/2019 0932   ALT 11 10/03/2019 0932   ALKPHOS 59 12/08/2016 0826   BILITOT 0.6 10/03/2019  0932   GFRNONAA 90 10/03/2019 0932   GFRAA 104 10/03/2019 0932   Lipid Panel     Component Value Date/Time   CHOL 222 (H) 10/03/2019 0927   TRIG 106 10/03/2019 0927   HDL 66 10/03/2019 0927   CHOLHDL 3.4 10/03/2019 0927   VLDL 24 12/08/2016 0829   LDLCALC 135 (H) 10/03/2019 0927   June 28, 2018 labs TSH 2.9  7, free T4 1.2  Recent Results (from the past 2160 hour(s))  CBC     Status: None   Collection Time: 10/03/19  9:27 AM  Result Value Ref Range   WBC 5.4 3.8 - 10.8 Thousand/uL   RBC 4.83 3.80 - 5.10 Million/uL   Hemoglobin 14.6 11.7 - 15.5 g/dL   HCT 43.6 35.0 - 45.0 %   MCV 90.3 80.0 - 100.0 fL   MCH 30.2 27.0 - 33.0 pg   MCHC 33.5 32.0 - 36.0 g/dL   RDW 12.3 11.0 - 15.0 %   Platelets 243 140 - 400 Thousand/uL   MPV 10.1 7.5 - 12.5 fL  Lipid panel     Status: Abnormal   Collection Time: 10/03/19  9:27 AM  Result Value Ref Range   Cholesterol 222 (H) <200 mg/dL   HDL 66 > OR = 50 mg/dL   Triglycerides 106 <150 mg/dL   LDL Cholesterol (Calc) 135 (H) mg/dL (calc)    Comment: Reference range: <100 . Desirable range <100 mg/dL for primary prevention;   <70 mg/dL for patients with CHD or diabetic patients  with > or = 2 CHD risk factors. Marland Kitchen LDL-C is now calculated using the Martin-Hopkins  calculation, which is a validated novel method providing  better accuracy than the Friedewald equation in the  estimation of LDL-C.  Cresenciano Genre et al. Annamaria Helling. [id number]): 2061-2068  (http://education.[facility].com/faq/FAQ164)    Total CHOL/HDL Ratio 3.4 <5.0 (calc)   Non-HDL Cholesterol (Calc) 156 (H) <130 mg/dL (calc)    Comment: For patients with diabetes plus 1 major ASCVD risk  factor, treating to a non-HDL-C goal of <100 mg/dL  (LDL-C of <70 mg/dL) is considered a therapeutic  option.   COMPLETE METABOLIC PANEL WITH GFR     Status: Abnormal   Collection Time: 10/03/19  9:32 AM  Result Value Ref Range   Glucose, Bld 95 65 - 99 mg/dL    Comment: .             Fasting reference interval .    BUN 16 7 - 25 mg/dL   Creat 0.61 0.60 - 0.93 mg/dL    Comment: For patients >55 years of age, the reference limit for Creatinine is approximately 13% higher for people identified as African-American. .    GFR, Est Non African American 90 > OR = 60 mL/min/1.74m2   GFR, Est African American 104 > OR = 60 mL/min/1.84m2   BUN/Creatinine Ratio NOT APPLICABLE 6 - 22 (calc)   Sodium 144 135 - 146 mmol/L   Potassium 4.9 3.5 - 5.3 mmol/L   Chloride 105 98 - 110 mmol/L   CO2 33 (H) 20 - 32 mmol/L   Calcium 9.4 8.6 - 10.4 mg/dL   Total Protein 6.2 6.1 - 8.1 g/dL   Albumin 4.2 3.6 - 5.1 g/dL   Globulin 2.0 1.9 - 3.7 g/dL (calc)   AG Ratio 2.1 1.0 - 2.5 (calc)   Total Bilirubin 0.6 0.2 - 1.2 mg/dL   Alkaline phosphatase (APISO) 61 37 - 153 U/L   AST 17 10 - 35 U/L   ALT 11 6 - 29 U/L  TSH     Status: None   Collection Time: 10/03/19  9:32 AM  Result Value Ref Range   TSH 2.62 0.40 - 4.50 mIU/L  T4, Free     Status: None   Collection Time: 10/03/19  9:32 AM  Result Value Ref Range   Free T4 1.0 0.8 - 1.8 ng/dL  VITAMIN D 25 Hydroxy (Vit-D Deficiency, Fractures)     Status: None   Collection Time: 10/03/19  9:32 AM  Result Value Ref Range   Vit D, 25-Hydroxy 40 30 - 100 ng/mL    Comment: Vitamin D Status         25-OH Vitamin D: . Deficiency:                    <20 ng/mL Insufficiency:             20 - 29 ng/mL Optimal:                 > or = 30 ng/mL . For 25-OH Vitamin D testing on patients on  D2-supplementation and patients for whom quantitation  of D2 and D3 fractions is required, the [facility](TM) 25-OH VIT D, (D2,D3), LC/MS/MS is recommended: order  code [id number] (patients >15yrs). See Note 1 . Note 1 . For additional information, please refer to  http://education.[facility].com/faq/FAQ199  (This link is being provided for informational/ educational purposes only.)    DXA on Feb. 6, 2020  AP Spine L2-L4 11/03/2018 72.2  Osteopenia -1.8 0.979 g/cm2 0.1% - AP Spine L2-L4 08/05/2016 69.9 Osteopenia -1.8 0.978 g/cm2 9.5% Yes AP Spine L2-L4 06/13/2014 67.8 Osteoporosis -2.6 0.893 g/cm2 -4.9% Yes AP Spine L2-L4 10/28/2005 59.2 Osteopenia -2.2 0.939 g/cm2 - -  DualFemur Neck Right 11/03/2018 72.2 Osteopenia -1.9 0.768 g/cm2 -3.6% - DualFemur Neck Right 08/05/2016 69.9 Osteopenia -1.7 0.797 g/cm2 1.5% - DualFemur Neck Right 06/13/2014 67.8 Osteopenia -1.8 0.785 g/cm2  Assessment & Plan:   1.  hypothyroidism Her thyroid function test from October 2019 were consistent with appropriate replacement.  She is advised to continue thyroxine 50 mcg po qam.  - We discussed about the correct intake of her thyroid hormone, on empty stomach at fasting, with water, separated by at least 30 minutes from breakfast and other medications,  and separated by more than 4 hours from calcium, iron, multivitamins, acid reflux medications (PPIs). -Patient is made aware of the fact that thyroid hormone replacement is needed for life, dose to be adjusted by periodic monitoring of thyroid function tests.   2. Osteoporosis  - she is tolerating alendronate 70 mg by mouth weekly.  - Based on her most recent bone density, she continued to gain some bone mass on the spine compared to prior study,  and no change on  Dual Femur BMD analysis. - This is better than expected outcome of therapy with alendronate. -  I encouraged her to stay on  alendronate 70 mg weekly for now. She has a propensity for falling ( no recent episodes), and  I advised her on precautions to avoid falling including walking on flat surface or treadmill instead of on outside pavements, and pick up  Strengthening exercises for back muscles. . - I advised her to continue vitamin D3 2,000 units daily for the next 6 months.  -She will continue with calcium carbonate 1200 mg daily with lunch.   -If her next  BMD response is not satisfactory, Or if she develops interim fracture,  she will be considered for Prolia.  3. Hyperlipidemia:  - She has family history of hyperlipidemia and coronary artery disease. Her LDL is  135 worsening from 116 . She would like to avoid therapy for now.   - I advised patient to maintain close follow up with Amanda Helper, MD for primary care needs.     - Time spent on this  patient care encounter:  25 minutes of which 50% was spent in  counseling and the rest reviewing  her current and  previous labs / studies and medications  doses and developing a plan for long term care. Amanda Harmon  participated in the discussions, expressed understanding, and voiced agreement with the above plans.  All questions were answered to her satisfaction. she is encouraged to contact clinic should she have any questions or concerns prior to her return visit.  Follow up plan: Return in about 6 months (around 04/04/2020) for Follow up with Pre-visit Labs.  Glade Lloyd, MD Phone: [phone]  Fax: [phone]   10/06/2019, 11:17 AM

## 2020-02-09 ENCOUNTER — Other Ambulatory Visit: Payer: Self-pay | Admitting: Family Medicine

## 2020-02-19 ENCOUNTER — Ambulatory Visit: Payer: Medicare Other | Admitting: Family Medicine

## 2020-02-21 ENCOUNTER — Other Ambulatory Visit: Payer: Self-pay | Admitting: Family Medicine

## 2020-02-22 ENCOUNTER — Encounter: Payer: Self-pay | Admitting: Family Medicine

## 2020-02-22 ENCOUNTER — Other Ambulatory Visit: Payer: Self-pay | Admitting: Family Medicine

## 2020-02-23 ENCOUNTER — Other Ambulatory Visit: Payer: Self-pay

## 2020-02-23 MED ORDER — SULFAMETHOXAZOLE-TRIMETHOPRIM 800-160 MG PO TABS: ORAL_TABLET | ORAL | 0 refills | Status: DC

## 2020-03-12 ENCOUNTER — Encounter: Payer: Self-pay | Admitting: Family Medicine

## 2020-03-12 ENCOUNTER — Ambulatory Visit (INDEPENDENT_AMBULATORY_CARE_PROVIDER_SITE_OTHER): Payer: Medicare Other | Admitting: Family Medicine

## 2020-03-12 ENCOUNTER — Other Ambulatory Visit: Payer: Self-pay

## 2020-03-12 VITALS — BP 156/82 | HR 76 | Temp 97.4°F | Resp 15 | Ht 67.0 in | Wt 129.0 lb

## 2020-03-12 DIAGNOSIS — N3090 Cystitis, unspecified without hematuria: Secondary | ICD-10-CM

## 2020-03-12 DIAGNOSIS — E785 Hyperlipidemia, unspecified: Secondary | ICD-10-CM | POA: Diagnosis not present

## 2020-03-12 DIAGNOSIS — M8580 Other specified disorders of bone density and structure, unspecified site: Secondary | ICD-10-CM

## 2020-03-12 DIAGNOSIS — R030 Elevated blood-pressure reading, without diagnosis of hypertension: Secondary | ICD-10-CM

## 2020-03-12 DIAGNOSIS — E038 Other specified hypothyroidism: Secondary | ICD-10-CM

## 2020-03-12 NOTE — Patient Instructions (Addendum)
Keep wellness appointment,  MD follow up in office in 6 months, call if you need me sooner  Mammogram to be scheduled at checkout  Blood pressure is elevated at this visit. Please check blood pressure once weekly, check in the morning beforeyou get busy in the day, record and leave readings in the office in 4 to 6 weeks If your higher number  is most often 145 or more, like your cardiologist , I would recommend a low dose of medication  Continue great exercise habits and low sodium diet with a lot of vegetables and fruit fresh or frozen, vegetables more than fruit  Please  Get fasting lipid panel and chem 7 and eGFr in next 2 months  Thanks for choosing [facility], we consider it a privelige to serve you.  DASH Eating Plan DASH stands for "Dietary Approaches to Stop Hypertension." The DASH eating plan is a healthy eating plan that has been shown to reduce high blood pressure (hypertension). It may also reduce your risk for type 2 diabetes, heart disease, and stroke. The DASH eating plan may also help with weight loss. What are tips for following this plan?  General guidelines  Avoid eating more than 2,300 mg (milligrams) of salt (sodium) a day. If you have hypertension, you may need to reduce your sodium intake to 1,500 mg a day.  Limit alcohol intake to no more than 1 drink a day for nonpregnant women and 2 drinks a day for men. One drink equals 12 oz of beer, 5 oz of wine, or 1 oz of hard liquor.  Work with your health care provider to maintain a healthy body weight or to lose weight. Ask what an ideal weight is for you.  Get at least 30 minutes of exercise that causes your heart to beat faster (aerobic exercise) most days of the week. Activities may include walking, swimming, or biking.  Work with your health care provider or diet and nutrition specialist (dietitian) to adjust your eating plan to your individual calorie needs. Reading food labels   Check food labels  for the amount of sodium per serving. Choose foods with less than 5 percent of the Daily Value of sodium. Generally, foods with less than 300 mg of sodium per serving fit into this eating plan.  To find whole grains, look for the word "whole" as the first word in the ingredient list. Shopping  Buy products labeled as "low-sodium" or "no salt added."  Buy fresh foods. Avoid canned foods and premade or frozen meals. Cooking  Avoid adding salt when cooking. Use salt-free seasonings or herbs instead of table salt or sea salt. Check with your health care provider or pharmacist before using salt substitutes.  Do not fry foods. Cook foods using healthy methods such as baking, boiling, grilling, and broiling instead.  Cook with heart-healthy oils, such as olive, canola, soybean, or sunflower oil. Meal planning  Eat a balanced diet that includes: ? 5 or more servings of fruits and vegetables each day. At each meal, try to fill half of your plate with fruits and vegetables. ? Up to 6-8 servings of whole grains each day. ? Less than 6 oz of lean meat, poultry, or fish each day. A 3-oz serving of meat is about the same size as a deck of cards. One egg equals 1 oz. ? 2 servings of low-fat dairy each day. ? A serving of nuts, seeds, or beans 5 times each week. ? Heart-healthy fats. Healthy fats called  Omega-3 fatty acids are found in foods such as flaxseeds and coldwater fish, like sardines, salmon, and mackerel.  Limit how much you eat of the following: ? Canned or prepackaged foods. ? Food that is high in trans fat, such as fried foods. ? Food that is high in saturated fat, such as fatty meat. ? Sweets, desserts, sugary drinks, and other foods with added sugar. ? Full-fat dairy products.  Do not salt foods before eating.  Try to eat at least 2 vegetarian meals each week.  Eat more home-cooked food and less restaurant, buffet, and fast food.  When eating at a restaurant, ask that your food  be prepared with less salt or no salt, if possible. What foods are recommended? The items listed may not be a complete list. Talk with your dietitian about what dietary choices are best for you. Grains Whole-grain or whole-wheat bread. Whole-grain or whole-wheat pasta. Brown rice. [place]. Bulgur. Whole-grain and low-sodium cereals. Pita bread. Low-fat, low-sodium crackers. Whole-wheat flour tortillas. Vegetables Fresh or frozen vegetables (raw, steamed, roasted, or grilled). Low-sodium or reduced-sodium tomato and vegetable juice. Low-sodium or reduced-sodium tomato sauce and tomato paste. Low-sodium or reduced-sodium canned vegetables. Fruits All fresh, dried, or frozen fruit. Canned fruit in natural juice (without added sugar). Meat and other protein foods Skinless chicken or [place]. Ground chicken or [place]. Pork with fat trimmed off. Fish and seafood. Egg whites. Dried beans, peas, or lentils. Unsalted nuts, nut butters, and seeds. Unsalted canned beans. Lean cuts of beef with fat trimmed off. Low-sodium, lean deli meat. Dairy Low-fat (1%) or fat-free (skim) milk. Fat-free, low-fat, or reduced-fat cheeses. Nonfat, low-sodium ricotta or cottage cheese. Low-fat or nonfat yogurt. Low-fat, low-sodium cheese. Fats and oils Soft margarine without trans fats. Vegetable oil. Low-fat, reduced-fat, or light mayonnaise and salad dressings (reduced-sodium). Canola, safflower, olive, soybean, and sunflower oils. Avocado. Seasoning and other foods Herbs. Spices. Seasoning mixes without salt. Unsalted popcorn and pretzels. Fat-free sweets. What foods are not recommended? The items listed may not be a complete list. Talk with your dietitian about what dietary choices are best for you. Grains Baked goods made with fat, such as croissants, muffins, or some breads. Dry pasta or rice meal packs. Vegetables Creamed or fried vegetables. Vegetables in a cheese sauce. Regular canned vegetables (not  low-sodium or reduced-sodium). Regular canned tomato sauce and paste (not low-sodium or reduced-sodium). Regular tomato and vegetable juice (not low-sodium or reduced-sodium). Angie Fava. Olives. Fruits Canned fruit in a light or heavy syrup. Fried fruit. Fruit in cream or butter sauce. Meat and other protein foods Fatty cuts of meat. Ribs. Fried meat. Berniece Salines. Sausage. Bologna and other processed lunch meats. Salami. Fatback. Hotdogs. Bratwurst. Salted nuts and seeds. Canned beans with added salt. Canned or smoked fish. Whole eggs or egg yolks. Chicken or [place] with skin. Dairy Whole or 2% milk, cream, and half-and-half. Whole or full-fat cream cheese. Whole-fat or sweetened yogurt. Full-fat cheese. Nondairy creamers. Whipped toppings. Processed cheese and cheese spreads. Fats and oils Butter. Stick margarine. Lard. Shortening. Ghee. Bacon fat. Tropical oils, such as coconut, palm kernel, or palm oil. Seasoning and other foods Salted popcorn and pretzels. Onion salt, garlic salt, seasoned salt, table salt, and sea salt. Worcestershire sauce. Tartar sauce. Barbecue sauce. Teriyaki sauce. Soy sauce, including reduced-sodium. Steak sauce. Canned and packaged gravies. Fish sauce. Oyster sauce. Cocktail sauce. Horseradish that you find on the shelf. Ketchup. Mustard. Meat flavorings and tenderizers. Bouillon cubes. Hot sauce and Tabasco sauce. Premade or packaged marinades. Premade  or packaged taco seasonings. Relishes. Regular salad dressings. Where to find more information:  National Heart, Lung, and [place]: https://wilson-eaton.com/  American Heart Association: www.heart.org Summary  The DASH eating plan is a healthy eating plan that has been shown to reduce high blood pressure (hypertension). It may also reduce your risk for type 2 diabetes, heart disease, and stroke.  With the DASH eating plan, you should limit salt (sodium) intake to 2,300 mg a day. If you have hypertension, you may need to reduce  your sodium intake to 1,500 mg a day.  When on the DASH eating plan, aim to eat more fresh fruits and vegetables, whole grains, lean proteins, low-fat dairy, and heart-healthy fats.  Work with your health care provider or diet and nutrition specialist (dietitian) to adjust your eating plan to your individual calorie needs. This information is not intended to replace advice given to you by your health care provider. Make sure you discuss any questions you have with your health care provider. Document Revised: 08/27/2017 Document Reviewed: 09/07/2016 Elsevier Patient Education  2020 [employer].

## 2020-03-12 NOTE — Progress Notes (Signed)
   Amanda Harmon     MRN: [id number]      DOB: January 16, 1946   HPI Amanda Harmon is here for follow up and re-evaluation of chronic medical conditions, medication management and review of any available recent lab and radiology data.  Preventive health is updated, specifically  Cancer screening and Immunization.    The PT denies any adverse reactions to current medications since the last visit.  6 month h/o dedicated exercise , on avg 1 hr each time, no falls , no exercise intolerance noted  ROS Denies recent fever or chills. Denies sinus pressure, nasal congestion, ear pain or sore throat. Denies chest congestion, productive cough or wheezing. Denies chest pains, palpitations and leg swelling Denies abdominal pain, nausea, vomiting,diarrhea or constipation.   Denies dysuria, frequency, hesitancy or incontinence. Denies joint pain, swelling and limitation in mobility. Denies headaches, seizures, numbness, or tingling. Denies depression, does have mild anxiety and denies  insomnia. Denies skin break down or rash.   PE  BP (!) 156/82   Pulse 76   Temp (!) 97.4 F (36.3 C) (Temporal)   Resp 15   Ht 5\' 7"  (1.702 m)   Wt 129 lb (58.5 kg)   SpO2 98%   BMI 20.20 kg/m    Patient alert and oriented and in no cardiopulmonary distress.  HEENT: No facial asymmetry, EOMI,     Neck supple .  Chest: Clear to auscultation bilaterally.  CVS: S1, S2 no murmurs, no S3.Regular rate.  ABD: Soft non tender.   Ext: No edema  MS: Adequate ROM spine, shoulders, hips and knees.  Skin: Intact, no ulcerations or rash noted.  Psych: Good eye contact, normal affect. Memory intact not anxious or depressed appearing.  CNS: CN 2-12 intact, power,  normal throughout.no focal deficits noted.   Assessment & Plan  Elevated blood pressure reading in office without diagnosis of hypertension Pt to test weekly at home and turn in values in next 4 to 6 weeks. Of note, this was alos elevated at  cardiologyu visit, same plan proposed but did not follow through. DASH diet and commitment to daily physical activity for a minimum of 30 minutes discussed and encouraged, as a part of hypertension management. The importance of attaining a healthy weight is also discussed.  BP/Weight 03/12/2020 09/01/2019 07/18/2019 05/25/2019 02/23/2019 10/05/2018 29/24/4628  Systolic BP 638 177 116 579 [phone]  Diastolic BP 82 88 70 70 85 82 80  Wt. (Lbs) 129 133.8 133 133.8 130 132 134  BMI 20.2 20.96 20.83 22.27 21.63 21.97 22.3       Recurrent cystitis Septra as needed, generally post coital  Osteopenia Treated by Endo has fall and fracture injury, none since last visit  Hypothyroidism Controlled and managed by Endo

## 2020-03-13 ENCOUNTER — Other Ambulatory Visit: Payer: Self-pay | Admitting: Family Medicine

## 2020-03-13 DIAGNOSIS — Z1231 Encounter for screening mammogram for malignant neoplasm of breast: Secondary | ICD-10-CM

## 2020-03-18 ENCOUNTER — Encounter: Payer: Self-pay | Admitting: Family Medicine

## 2020-03-18 DIAGNOSIS — R030 Elevated blood-pressure reading, without diagnosis of hypertension: Secondary | ICD-10-CM | POA: Insufficient documentation

## 2020-03-18 NOTE — Assessment & Plan Note (Signed)
Controlled and managed by Endo 

## 2020-03-18 NOTE — Assessment & Plan Note (Signed)
Treated by Endo has fall and fracture injury, none since last visit

## 2020-03-18 NOTE — Assessment & Plan Note (Signed)
Septra as needed, generally post coital

## 2020-03-18 NOTE — Assessment & Plan Note (Signed)
Pt to test weekly at home and turn in values in next 4 to 6 weeks. Of note, this was alos elevated at cardiologyu visit, same plan proposed but did not follow through. DASH diet and commitment to daily physical activity for a minimum of 30 minutes discussed and encouraged, as a part of hypertension management. The importance of attaining a healthy weight is also discussed.  BP/Weight 03/12/2020 09/01/2019 07/18/2019 05/25/2019 02/23/2019 10/05/2018 76/22/6333  Systolic BP 545 625 638 937 [phone]  Diastolic BP 82 88 70 70 85 82 80  Wt. (Lbs) 129 133.8 133 133.8 130 132 134  BMI 20.2 20.96 20.83 22.27 21.63 21.97 22.3

## 2020-03-29 ENCOUNTER — Ambulatory Visit
Admission: RE | Admit: 2020-03-29 | Discharge: 2020-03-29 | Disposition: A | Discharge status: Home / Self Care | Payer: Medicare Other | Source: Ambulatory Visit | Attending: Family Medicine | Admitting: Family Medicine

## 2020-03-29 ENCOUNTER — Other Ambulatory Visit: Payer: Self-pay

## 2020-03-29 DIAGNOSIS — Z1231 Encounter for screening mammogram for malignant neoplasm of breast: Secondary | ICD-10-CM

## 2020-04-03 ENCOUNTER — Other Ambulatory Visit: Payer: Self-pay | Admitting: "Endocrinology

## 2020-04-04 ENCOUNTER — Encounter: Payer: Self-pay | Admitting: Family Medicine

## 2020-04-04 ENCOUNTER — Telehealth: Payer: Self-pay | Admitting: "Endocrinology

## 2020-04-04 NOTE — Telephone Encounter (Signed)
Patient needs her lab order updated. [facility] needs to go Monday at [facility]

## 2020-04-08 ENCOUNTER — Other Ambulatory Visit: Payer: Self-pay

## 2020-04-08 DIAGNOSIS — E039 Hypothyroidism, unspecified: Secondary | ICD-10-CM | POA: Diagnosis not present

## 2020-04-08 DIAGNOSIS — E559 Vitamin D deficiency, unspecified: Secondary | ICD-10-CM

## 2020-04-08 DIAGNOSIS — E785 Hyperlipidemia, unspecified: Secondary | ICD-10-CM | POA: Diagnosis not present

## 2020-04-08 LAB — LIPID PANEL
Cholesterol: 211 mg/dL — ABNORMAL HIGH (ref <200)
HDL: 63 mg/dL (ref >=50)
LDL Cholesterol (Calc): 126 mg/dL (calc) — ABNORMAL HIGH
Non-HDL Cholesterol (Calc): 148 mg/dL (calc) — ABNORMAL HIGH (ref <130)
Total CHOL/HDL Ratio: 3.3 (calc) (ref <5.0)
Triglycerides: 111 mg/dL (ref <150)

## 2020-04-08 LAB — COMPREHENSIVE METABOLIC PANEL
AG Ratio: 2 (calc) (ref 1.0–2.5)
ALT: 11 U/L (ref 6–29)
AST: 15 U/L (ref 10–35)
Albumin: 4 g/dL (ref 3.6–5.1)
Alkaline phosphatase (APISO): 67 U/L (ref 37–153)
BUN: 15 mg/dL (ref 7–25)
CO2: 29 mmol/L (ref 20–32)
Calcium: 9 mg/dL (ref 8.6–10.4)
Chloride: 108 mmol/L (ref 98–110)
Creat: 0.68 mg/dL (ref 0.60–0.93)
Globulin: 2 g/dL (calc) (ref 1.9–3.7)
Glucose, Bld: 93 mg/dL (ref 65–99)
Potassium: 3.8 mmol/L (ref 3.5–5.3)
Sodium: 143 mmol/L (ref 135–146)
Total Bilirubin: 0.6 mg/dL (ref 0.2–1.2)
Total Protein: 6 g/dL — ABNORMAL LOW (ref 6.1–8.1)

## 2020-04-08 LAB — TSH: TSH: 1.68 mIU/L (ref 0.40–4.50)

## 2020-04-08 LAB — T4, FREE: Free T4: 1.2 ng/dL (ref 0.8–1.8)

## 2020-04-08 LAB — VITAMIN D 25 HYDROXY (VIT D DEFICIENCY, FRACTURES): Vit D, 25-Hydroxy: 34 ng/mL (ref 30–100)

## 2020-04-08 NOTE — Telephone Encounter (Signed)
Lab orders were updated this morning.

## 2020-04-09 ENCOUNTER — Ambulatory Visit: Payer: Medicare Other | Admitting: "Endocrinology

## 2020-04-09 ENCOUNTER — Encounter: Payer: Self-pay | Admitting: "Endocrinology

## 2020-04-09 ENCOUNTER — Ambulatory Visit (INDEPENDENT_AMBULATORY_CARE_PROVIDER_SITE_OTHER): Payer: Medicare Other | Admitting: "Endocrinology

## 2020-04-09 ENCOUNTER — Encounter: Payer: Self-pay | Admitting: Family Medicine

## 2020-04-09 ENCOUNTER — Other Ambulatory Visit: Payer: Self-pay

## 2020-04-09 VITALS — BP 146/85 | HR 71 | Ht 67.0 in | Wt 131.6 lb

## 2020-04-09 DIAGNOSIS — E039 Hypothyroidism, unspecified: Secondary | ICD-10-CM | POA: Diagnosis not present

## 2020-04-09 DIAGNOSIS — M810 Age-related osteoporosis without current pathological fracture: Secondary | ICD-10-CM | POA: Diagnosis not present

## 2020-04-09 DIAGNOSIS — E559 Vitamin D deficiency, unspecified: Secondary | ICD-10-CM | POA: Diagnosis not present

## 2020-04-09 MED ORDER — ROSUVASTATIN CALCIUM 5 MG PO TABS: 5.0000 mg | ORAL_TABLET | Freq: Every day | ORAL | 3 refills | Status: DC

## 2020-04-09 MED ORDER — LEVOTHYROXINE SODIUM 50 MCG PO TABS: ORAL_TABLET | ORAL | 3 refills | Status: DC

## 2020-04-09 NOTE — Progress Notes (Signed)
04/09/2020      Endocrinology follow-up note   Subjective:    Patient ID: Amanda Harmon, female    DOB: 07-14-1946, PCP Fayrene Helper, MD   Past Medical History:  Diagnosis Date  . 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 Cystitis   . Hyperlipidemia    borderline   . Mitral valve prolapse 1984  . Thyroid disease 2015   hypothyroidism  . Vertigo    Past Surgical History:  Procedure Laterality Date  . BACK SURGERY  2008  . BREAST BIOPSY  1998   left - benign  . CARPECTOMY HAND Right 06/22/2016  . COLONOSCOPY N/A 01/17/2015   Procedure: COLONOSCOPY;  Surgeon: Rogene Houston, MD;  [facility];  Service: Endoscopy;  Laterality: N/A;  830 - moved to 4/21 @ 8:30  . EYE SURGERY Left 12/01/2013   cataract  . EYE SURGERY Right 12/15/2013   cataract  . IR GENERIC HISTORICAL  08/19/2016   IR RADIOLOGIST EVAL & MGMT 08/19/2016 [facility]  . SPINE SURGERY    . TONSILLECTOMY  1965  . TUBAL LIGATION  1983   Social History   Socioeconomic History  . Marital status: Married    Spouse name: Chrissie Noa  . Number of children: 3  . Years of education: master's degree  . Highest education level: Master's degree (e.g., MA, MS, MEng, MEd, MSW, MBA)  Occupational History  . Occupation: Arts development officer: RETIRED  Tobacco Use  . Smoking status: Former Smoker    Packs/day: 1.00    Years: 4.00    Pack years: 4.00    Quit date: 12/15/1968    Years since quitting: 51.3  . Smokeless tobacco: Never Used  Vaping Use  . Vaping Use: Never used  Substance and Sexual Activity  . Alcohol use: Yes    Comment: socially   . Drug use: No  . Sexual activity: Yes  Other Topics Concern  . Not on file  Social History Narrative  . Not on file   Social Determinants of Health   Financial Resource Strain:   . Difficulty of Paying Living Expenses:   Food Insecurity:   . Worried About Charity fundraiser in the Last Year:   . Arboriculturist in the  Last Year:   Transportation Needs:   . Film/video editor (Medical):   Marland Kitchen Lack of Transportation (Non-Medical):   Physical Activity:   . Days of Exercise per Week:   . Minutes of Exercise per Session:   Stress:   . Feeling of Stress :   Social Connections:   . Frequency of Communication with Friends and Family:   . Frequency of Social Gatherings with Friends and Family:   . Attends Religious Services:   . Active Member of Clubs or Organizations:   . Attends Archivist Meetings:   Marland Kitchen Marital Status:    Outpatient Encounter Medications as of 04/09/2020  Medication Sig  . alendronate (FOSAMAX) 70 MG tablet TAKE 1 TABLET BY MOUTH WEEKLY WITH A FULL GLASS OF WATER ON AN EMPTY STOMACH  . Ascorbic Acid (VITAMIN C) 1000 MG tablet Take 1,000 mg by mouth daily.  . Calcium Carbonate-Vit D-Min (CALCIUM 1200 PO) Take 1 tablet by mouth daily.  Marland Kitchen ketotifen (ZADITOR) 0.025 % ophthalmic solution Place 1 drop into both eyes every morning.  Marland Kitchen levothyroxine (SYNTHROID) 50 MCG tablet TAKE 1 TABLET BY MOUTH EVERY DAY IN THE MORNING  .  rosuvastatin (CRESTOR) 5 MG tablet Take 1 tablet (5 mg total) by mouth daily.  Marland Kitchen sulfamethoxazole-trimethoprim (BACTRIM DS) 800-160 MG tablet Take 1 tablet by mouth as directed.  . [DISCONTINUED] levothyroxine (SYNTHROID) 50 MCG tablet TAKE 1 TABLET BY MOUTH EVERY DAY IN THE MORNING   No facility-administered encounter medications on file as of 04/09/2020.   ALLERGIES: Allergies  Allergen Reactions  . Aspirin Other (See Comments)    Bruising  . Poison Oak Extract [Poison Oak Extract] Rash   VACCINATION STATUS: Immunization History  Administered Date(s) Administered  . Influenza Split 06/17/2014, 07/23/2017  . Influenza, High Dose Seasonal PF 07/05/2018  . Influenza,inj,Quad PF,6+ Mos 07/20/2013, 07/01/2015, 07/15/2016  . Moderna SARS-COVID-2 Vaccination 11/02/2019, 12/05/2019  . Pneumococcal Conjugate-13 10/30/2014  . Pneumococcal Polysaccharide-23  03/23/2012  . Tdap 12/09/2012  . Zoster 02/24/2007    HPI  74  yr old female with medical hx as above. She is being engaged in telehealth via telephone for her hypothyroidism and osteoporosis with repeat  DXA scan and  TFTs. For hypothyroidism, she was put on levothyroxine 50 mcg po qam, she is tolerating well.  Her previsit thyroid function tests are consistent with appropriate replacement. - she remains on alendronate 70 mg by mouth weekly by Dr. Moshe Cipro her primary medical doctor.  She has history of hand fracture 2 years ago.  Currently no interval problems, no further falls.   -Her DXA scan in Feb 2020 was c/w clinically significant improvement in the BMD of the spine, stable BMD in hip.  - She has history of compression fracture of first lumbar vertebra. No recent height change. She denies hx of goiter. Her last menstrual period was at age 89. she has 3 grown children. she smoked for 4 years in college. she never took steroids on chronic basis. she denies cold/heat intolerance. she is active , exercises regularly.   Review of Systems Limited as above.  Objective:    BP (!) 146/85   Pulse 71   Ht 5\' 7"  (1.702 m)   Wt 131 lb 9.6 oz (59.7 kg)   BMI 20.61 kg/m   Wt Readings from Last 3 Encounters:  04/09/20 131 lb 9.6 oz (59.7 kg)  03/12/20 129 lb (58.5 kg)  09/01/19 133 lb 12.8 oz (60.7 kg)    Physical Exam   CMP     Component Value Date/Time   NA 143 04/08/2020 0846   K 3.8 04/08/2020 0846   CL 108 04/08/2020 0846   CO2 29 04/08/2020 0846   GLUCOSE 93 04/08/2020 0846   BUN 15 04/08/2020 0846   CREATININE 0.68 04/08/2020 0846   CALCIUM 9.0 04/08/2020 0846   PROT 6.0 (L) 04/08/2020 0846   ALBUMIN 4.0 12/08/2016 0826   AST 15 04/08/2020 0846   ALT 11 04/08/2020 0846   ALKPHOS 59 12/08/2016 0826   BILITOT 0.6 04/08/2020 0846   GFRNONAA 90 10/03/2019 0932   GFRAA 104 10/03/2019 0932   Lipid Panel     Component Value Date/Time   CHOL 211 (H) 04/08/2020 0854    TRIG 111 04/08/2020 0854   HDL 63 04/08/2020 0854   CHOLHDL 3.3 04/08/2020 0854   VLDL 24 12/08/2016 0829   LDLCALC 126 (H) 04/08/2020 0854   June 28, 2018 labs TSH 2.9 7, free T4 1.2  Recent Results (from the past 2160 hour(s))  Comprehensive metabolic panel     Status: Abnormal   Collection Time: 04/08/20  8:46 AM  Result Value Ref Range   Glucose, Bld  93 65 - 99 mg/dL    Comment: .            Fasting reference interval .    BUN 15 7 - 25 mg/dL   Creat 0.68 0.60 - 0.93 mg/dL    Comment: For patients >63 years of age, the reference limit for Creatinine is approximately 13% higher for people identified as African-American. .    BUN/Creatinine Ratio NOT APPLICABLE 6 - 22 (calc)   Sodium 143 135 - 146 mmol/L   Potassium 3.8 3.5 - 5.3 mmol/L   Chloride 108 98 - 110 mmol/L   CO2 29 20 - 32 mmol/L   Calcium 9.0 8.6 - 10.4 mg/dL   Total Protein 6.0 (L) 6.1 - 8.1 g/dL   Albumin 4.0 3.6 - 5.1 g/dL   Globulin 2.0 1.9 - 3.7 g/dL (calc)   AG Ratio 2.0 1.0 - 2.5 (calc)   Total Bilirubin 0.6 0.2 - 1.2 mg/dL   Alkaline phosphatase (APISO) 67 37 - 153 U/L   AST 15 10 - 35 U/L   ALT 11 6 - 29 U/L  T4, Free     Status: None   Collection Time: 04/08/20  8:46 AM  Result Value Ref Range   Free T4 1.2 0.8 - 1.8 ng/dL  TSH     Status: None   Collection Time: 04/08/20  8:46 AM  Result Value Ref Range   TSH 1.68 0.40 - 4.50 mIU/L  Vitamin D, 25-hydroxy     Status: None   Collection Time: 04/08/20  8:46 AM  Result Value Ref Range   Vit D, 25-Hydroxy 34 30 - 100 ng/mL    Comment: Vitamin D Status         25-OH Vitamin D: . Deficiency:                    <20 ng/mL Insufficiency:             20 - 29 ng/mL Optimal:                 > or = 30 ng/mL . For 25-OH Vitamin D testing on patients on  D2-supplementation and patients for whom quantitation  of D2 and D3 fractions is required, the [facility](TM) 25-OH VIT D, (D2,D3), LC/MS/MS is recommended: order  code [phone] (patients  >58yrs). See Note 1 . Note 1 . For additional information, please refer to  http://education.[facility].com/faq/FAQ199  (This link is being provided for informational/ educational purposes only.)   Lipid panel     Status: Abnormal   Collection Time: 04/08/20  8:54 AM  Result Value Ref Range   Cholesterol 211 (H) <200 mg/dL   HDL 63 > OR = 50 mg/dL   Triglycerides 111 <150 mg/dL   LDL Cholesterol (Calc) 126 (H) mg/dL (calc)    Comment: Reference range: <100 . Desirable range <100 mg/dL for primary prevention;   <70 mg/dL for patients with CHD or diabetic patients  with > or = 2 CHD risk factors. Marland Kitchen LDL-C is now calculated using the Martin-Hopkins  calculation, which is a validated novel method providing  better accuracy than the Friedewald equation in the  estimation of LDL-C.  Cresenciano Genre et al. Annamaria Helling. 1914;782(95): 2061-2068  (http://education.[facility].com/faq/FAQ164)    Total CHOL/HDL Ratio 3.3 <5.0 (calc)   Non-HDL Cholesterol (Calc) 148 (H) <130 mg/dL (calc)    Comment: For patients with diabetes plus 1 major ASCVD risk  factor, treating to a non-HDL-C goal of <100 mg/dL  (  LDL-C of <70 mg/dL) is considered a therapeutic  option.    DXA on Feb. 6, 2020  AP Spine L2-L4 11/03/2018 72.2 Osteopenia -1.8 0.979 g/cm2 0.1% - AP Spine L2-L4 08/05/2016 69.9 Osteopenia -1.8 0.978 g/cm2 9.5% Yes AP Spine L2-L4 06/13/2014 67.8 Osteoporosis -2.6 0.893 g/cm2 -4.9% Yes AP Spine L2-L4 10/28/2005 59.2 Osteopenia -2.2 0.939 g/cm2 - -  DualFemur Neck Right 11/03/2018 72.2 Osteopenia -1.9 0.768 g/cm2 -3.6% - DualFemur Neck Right 08/05/2016 69.9 Osteopenia -1.7 0.797 g/cm2 1.5% - DualFemur Neck Right 06/13/2014 67.8 Osteopenia -1.8 0.785 g/cm2  Assessment & Plan:   1.  hypothyroidism Her thyroid function test from October 2019 were consistent with appropriate replacement.  She is advised to continue levothyroxine 50 mcg p.o. daily before breakfast.    - We discussed  about the correct intake of her thyroid hormone, on empty stomach at fasting, with water, separated by at least 30 minutes from breakfast and other medications,  and separated by more than 4 hours from calcium, iron, multivitamins, acid reflux medications (PPIs). -Patient is made aware of the fact that thyroid hormone replacement is needed for life, dose to be adjusted by periodic monitoring of thyroid function tests.   2. Osteoporosis  - she is tolerating alendronate 70 mg by mouth weekly.  - Based on her most recent bone density, she continued to gain some bone mass on the spine compared to prior study,  and no change on  Dual Femur BMD analysis. - This is better than expected outcome of therapy with alendronate. -  I encouraged her to stay on  alendronate 70 mg weekly for now. She has a propensity for falling ( no recent episodes), and  I advised her on precautions to avoid falling including walking on flat surface or treadmill instead of on outside pavements, and pick up  Strengthening exercises for back muscles. . - I advised her to continue vitamin D3 2,000 units daily for the next 6 months.  -She will continue with calcium carbonate 1200 mg daily with lunch.   -If her next  BMD response is not satisfactory, Or if she develops interim fracture, she will be considered for Prolia. -Her next bone density will be done in February 2022.  3. Hyperlipidemia:  - She has family history of hyperlipidemia and coronary artery disease. Her LDL remains high at 126.  She has been has started on statin intervention, however this time she is willing to consider.  I discussed and initiated Crestor 5 mg p.o. nightly.  Side effects and precautions discussed with her.  - I advised patient to maintain close follow up with Fayrene Helper, MD for primary care needs.      - Time spent on this patient care encounter:  20 minutes of which 50% was spent in  counseling and the rest reviewing  her current and   previous labs / studies and medications  doses and developing a plan for long term care. Hendricks Limes  participated in the discussions, expressed understanding, and voiced agreement with the above plans.  All questions were answered to her satisfaction. she is encouraged to contact clinic should she have any questions or concerns prior to her return visit.   Follow up plan: Return in about 8 months (around 12/08/2020) for DXA Scan B4 NV, F/U with Pre-visit Labs.  Glade Lloyd, MD Phone: [phone]  Fax: [phone]   04/09/2020, 1:02 PM

## 2020-05-08 DIAGNOSIS — R220 Localized swelling, mass and lump, head: Secondary | ICD-10-CM | POA: Diagnosis not present

## 2020-05-08 DIAGNOSIS — T783XXA Angioneurotic edema, initial encounter: Secondary | ICD-10-CM | POA: Diagnosis not present

## 2020-06-06 ENCOUNTER — Other Ambulatory Visit: Payer: Self-pay | Admitting: "Endocrinology

## 2020-06-06 DIAGNOSIS — M810 Age-related osteoporosis without current pathological fracture: Secondary | ICD-10-CM

## 2020-06-10 DIAGNOSIS — L308 Other specified dermatitis: Secondary | ICD-10-CM | POA: Diagnosis not present

## 2020-06-12 ENCOUNTER — Telehealth: Payer: Self-pay | Admitting: *Deleted

## 2020-06-12 NOTE — Telephone Encounter (Signed)
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 She just wanted you to know in case they call you.

## 2020-06-13 NOTE — Telephone Encounter (Signed)
Noted  

## 2020-07-18 ENCOUNTER — Other Ambulatory Visit: Payer: Self-pay

## 2020-07-18 ENCOUNTER — Ambulatory Visit (INDEPENDENT_AMBULATORY_CARE_PROVIDER_SITE_OTHER): Payer: Medicare Other

## 2020-07-18 VITALS — BP 146/85 | HR 71 | Ht 67.0 in | Wt 131.0 lb

## 2020-07-18 DIAGNOSIS — Z0000 Encounter for general adult medical examination without abnormal findings: Secondary | ICD-10-CM

## 2020-07-18 NOTE — Progress Notes (Addendum)
Subjective:   YARROW LINHART is a 74 y.o. female who presents for Medicare Annual (Subsequent) preventive examination.       Objective:    There were no vitals filed for this visit. There is no height or weight on file to calculate BMI.  Advanced Directives 07/18/2019 04/27/2017 04/13/2017 12/15/2016 08/31/2016 02/20/2015 01/17/2015  Does Patient Have a Medical Advance Directive? Yes No No No No No No  Type of Paramedic of [place];Living will - - - - - -  Copy of [place] in Chart? No - copy requested - - - - - -  Would patient like information on creating a medical advance directive? - No - Patient declined No - Patient declined Yes (MAU/Ambulatory/Procedural Areas - Information given) No - Patient declined No - patient declined information No - patient declined information    Current Medications (verified) Outpatient Encounter Medications as of 07/18/2020  Medication Sig  . alendronate (FOSAMAX) 70 MG tablet TAKE 1 TABLET BY MOUTH WEEKLY WITH A FULL GLASS OF WATER ON AN EMPTY STOMACH  . Ascorbic Acid (VITAMIN C) 1000 MG tablet Take 1,000 mg by mouth daily.  . Calcium Carbonate-Vit D-Min (CALCIUM 1200 PO) Take 1 tablet by mouth daily.  Marland Kitchen ketotifen (ZADITOR) 0.025 % ophthalmic solution Place 1 drop into both eyes every morning.  Marland Kitchen levothyroxine (SYNTHROID) 50 MCG tablet TAKE 1 TABLET BY MOUTH EVERY DAY IN THE MORNING  . rosuvastatin (CRESTOR) 5 MG tablet Take 1 tablet (5 mg total) by mouth daily.  Marland Kitchen sulfamethoxazole-trimethoprim (BACTRIM DS) 800-160 MG tablet Take 1 tablet by mouth as directed.   No facility-administered encounter medications on file as of 07/18/2020.    Allergies (verified) Aspirin and Poison oak extract [poison oak extract]   History: Past Medical History:  Diagnosis Date  . 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 Cystitis   . Hyperlipidemia    borderline   . Mitral valve prolapse 1984  . Thyroid disease  2015   hypothyroidism  . Vertigo    Past Surgical History:  Procedure Laterality Date  . BACK SURGERY  2008  . BREAST BIOPSY  1998   left - benign  . CARPECTOMY HAND Right 06/22/2016  . COLONOSCOPY N/A 01/17/2015   Procedure: COLONOSCOPY;  Surgeon: Rogene Houston, MD;  [facility];  Service: Endoscopy;  Laterality: N/A;  830 - moved to 4/21 @ 8:30  . EYE SURGERY Left 12/01/2013   cataract  . EYE SURGERY Right 12/15/2013   cataract  . IR GENERIC HISTORICAL  08/19/2016   IR RADIOLOGIST EVAL & MGMT 08/19/2016 [facility]  . SPINE SURGERY    . TONSILLECTOMY  1965  . TUBAL LIGATION  1983   Family History  Problem Relation Age of Onset  . Heart attack Mother 43       used tobacco   . Arthritis Mother        severe   . Heart attack Father 44       used tobacco   . Alcohol abuse Father   . Cancer Brother 3       prostate   . Osteoporosis Sister        severe   . Osteoarthritis Sister    Social History   Socioeconomic History  . Marital status: Married    Spouse name: Chrissie Noa  . Number of children: 3  . Years of education: master's degree  . Highest education level: Master's  degree (e.g., MA, MS, MEng, MEd, MSW, MBA)  Occupational History  . Occupation: Arts development officer: RETIRED  Tobacco Use  . Smoking status: Former Smoker    Packs/day: 1.00    Years: 4.00    Pack years: 4.00    Quit date: 12/15/1968    Years since quitting: 51.6  . Smokeless tobacco: Never Used  Vaping Use  . Vaping Use: Never used  Substance and Sexual Activity  . Alcohol use: Yes    Comment: socially   . Drug use: No  . Sexual activity: Yes  Other Topics Concern  . Not on file  Social History Narrative  . Not on file   Social Determinants of Health   Financial Resource Strain:   . Difficulty of Paying Living Expenses: Not on file  Food Insecurity:   . Worried About Charity fundraiser in the Last Year: Not on file  . Ran Out of Food in the Last  Year: Not on file  Transportation Needs:   . Lack of Transportation (Medical): Not on file  . Lack of Transportation (Non-Medical): Not on file  Physical Activity:   . Days of Exercise per Week: Not on file  . Minutes of Exercise per Session: Not on file  Stress:   . Feeling of Stress : Not on file  Social Connections:   . Frequency of Communication with Friends and Family: Not on file  . Frequency of Social Gatherings with Friends and Family: Not on file  . Attends Religious Services: Not on file  . Active Member of Clubs or Organizations: Not on file  . Attends Archivist Meetings: Not on file  . Marital Status: Not on file    Tobacco Counseling Counseling given: Not Answered   Clinical Intake:                 Diabetic? No         Activities of Daily Living No flowsheet data found.  Patient Care Team: Fayrene Helper, MD as PCP - General (Family Medicine) Iran Planas, MD as Consulting Physician (Orthopedic Surgery) Burnell Blanks, MD as Consulting Physician (Cardiology)  Indicate any recent Medical Services you may have received from other than [facility] providers in the past year (date may be approximate).     Assessment:   This is a routine wellness examination for Lavern.  Hearing/Vision screen No exam data present  Dietary issues and exercise activities discussed:    Goals    . Exercise strength training     Patient would like to start strength training 3 days a week for 30 minutes at a time.     . Patient Stated     Patient would like to go through closets in her home and organize and clean out       Depression Screen PHQ 2/9 Scores 03/12/2020 07/18/2019 02/23/2019 07/13/2018 10/20/2017 08/24/2017 12/15/2016  PHQ - 2 Score 0 0 0 0 0 0 0  PHQ- 9 Score - - - - 0 - -    Fall Risk Fall Risk  03/12/2020 07/18/2019 02/23/2019 08/15/2018 07/13/2018  Falls in the past year? 0 0 0 0 No  Comment - - - Emmi Telephone Survey: data  to providers prior to load -  Number falls in past yr: - 0 0 - -  Injury with Fall? - 0 0 - -  Risk for fall due to : - - - - -  Follow up -  Falls evaluation completed;Education provided - - -  Comment - - - - -    Any stairs in or around the home? Yes  If so, are there any without handrails? Yes  Home free of loose throw rugs in walkways, pet beds, electrical cords, etc? Yes  Adequate lighting in your home to reduce risk of falls? Yes   ASSISTIVE DEVICES UTILIZED TO PREVENT FALLS:  Life alert? No  Use of a cane, walker or w/c? No  Grab bars in the bathroom? No  Shower chair or bench in shower? No  Elevated toilet seat or a handicapped toilet? No   TIMED UP AND GO:  Was the test performed? No .      Cognitive Function:     6CIT Screen 07/18/2019 07/13/2018 12/15/2016  What Year? 0 points 0 points 0 points  What month? 0 points 0 points 0 points  What time? 0 points 0 points 0 points  Count back from 20 0 points 0 points 0 points  Months in reverse 0 points 0 points 0 points  Repeat phrase 0 points 0 points 0 points  Total Score 0 0 0    Immunizations Immunization History  Administered Date(s) Administered  . Influenza Split 06/17/2014, 07/23/2017  . Influenza, High Dose Seasonal PF 07/05/2018  . Influenza,inj,Quad PF,6+ Mos 07/20/2013, 07/01/2015, 07/15/2016  . Moderna SARS-COVID-2 Vaccination 11/02/2019, 12/05/2019  . Pneumococcal Conjugate-13 10/30/2014  . Pneumococcal Polysaccharide-23 03/23/2012  . Tdap 12/09/2012  . Zoster 02/24/2007    TDAP status: Up to date Flu Vaccine status: Completed at today's visit Pneumococcal vaccine status: Up to date Covid-19 vaccine status: Completed vaccines  Qualifies for Shingles Vaccine? Yes   Zostavax completed Yes   Shingrix Completed?: Yes  Screening Tests Health Maintenance  Topic Date Due  . INFLUENZA VACCINE  04/28/2020  . MAMMOGRAM  03/29/2022  . TETANUS/TDAP  12/10/2022  . COLONOSCOPY  01/16/2025  .  DEXA SCAN  Completed  . COVID-19 Vaccine  Completed  . Hepatitis C Screening  Completed  . PNA vac Low Risk Adult  Completed  . URINE MICROALBUMIN  Discontinued    Health Maintenance  Health Maintenance Due  Topic Date Due  . INFLUENZA VACCINE  04/28/2020    Colorectal cancer screening: Completed normal. Repeat every 10 years Mammogram status: Completed normal. Repeat every year Bone Density status: Completed normal. Results reflect: Bone density results: OSTEOPENIA. Repeat every 2 years.  Lung Cancer Screening: (Low Dose CT Chest recommended if Age 54-80 years, 30 pack-year currently smoking OR have quit w/in 15years.) does not qualify.    Additional Screening:  Hepatitis C Screening: does qualify; Completed   Vision Screening: Recommended annual ophthalmology exams for early detection of glaucoma and other disorders of the eye. Is the patient up to date with their annual eye exam?  Yes    Who is the provider or what is the name of the office in which the patient attends annual eye exams? [facility]   If pt is not established with a provider, would they like to be referred to a provider to establish care? No .   Dental Screening: Recommended annual dental exams for proper oral hygiene  Community Resource Referral / Chronic Care Management: CRR required this visit?  No   CCM required this visit?  No      Plan:     I have personally reviewed and noted the following in the patient's chart:   . Medical and social history . Use of alcohol, tobacco  or illicit drugs  . Current medications and supplements . Functional ability and status . Nutritional status . Physical activity . Advanced directives . List of other physicians . Hospitalizations, surgeries, and ER visits in previous 12 months . Vitals . Screenings to include cognitive, depression, and falls . Referrals and appointments  In addition, I have reviewed and discussed with patient certain preventive protocols,  quality metrics, and best practice recommendations. A written personalized care plan for preventive services as well as general preventive health recommendations were provided to patient.     Lonn Georgia, LPN   [id number]   Nurse Notes: AWV conducted over the phone with pt in the home, and provider in the office.

## 2020-07-19 ENCOUNTER — Ambulatory Visit (INDEPENDENT_AMBULATORY_CARE_PROVIDER_SITE_OTHER): Payer: Medicare Other

## 2020-07-19 ENCOUNTER — Other Ambulatory Visit: Payer: Self-pay

## 2020-07-19 ENCOUNTER — Encounter: Payer: Self-pay | Admitting: Physician Assistant

## 2020-07-19 DIAGNOSIS — Z23 Encounter for immunization: Secondary | ICD-10-CM | POA: Diagnosis not present

## 2020-07-23 DIAGNOSIS — Z23 Encounter for immunization: Secondary | ICD-10-CM | POA: Diagnosis not present

## 2020-09-05 ENCOUNTER — Ambulatory Visit: Payer: Medicare Other | Admitting: Physician Assistant

## 2020-09-11 ENCOUNTER — Ambulatory Visit: Payer: Medicare Other | Admitting: Family Medicine

## 2020-10-10 ENCOUNTER — Other Ambulatory Visit: Payer: Medicare Other

## 2020-10-10 DIAGNOSIS — Z20822 Contact with and (suspected) exposure to covid-19: Secondary | ICD-10-CM | POA: Diagnosis not present

## 2020-10-13 LAB — NOVEL CORONAVIRUS, NAA: SARS-CoV-2, NAA: NOT DETECTED

## 2020-10-28 NOTE — Telephone Encounter (Signed)
Due to [facility] medical board guidelines the patient should be physically located in the same state as the provider is licensed at time of virtual visit, so needs to reschedule. Please assist. Please thank her for letting us know!

## 2020-10-31 ENCOUNTER — Ambulatory Visit: Payer: Medicare Other | Admitting: Physician Assistant

## 2020-12-02 ENCOUNTER — Telehealth: Payer: Self-pay | Admitting: "Endocrinology

## 2020-12-02 ENCOUNTER — Other Ambulatory Visit: Payer: Self-pay

## 2020-12-02 DIAGNOSIS — E559 Vitamin D deficiency, unspecified: Secondary | ICD-10-CM

## 2020-12-02 DIAGNOSIS — E039 Hypothyroidism, unspecified: Secondary | ICD-10-CM

## 2020-12-02 NOTE — Telephone Encounter (Signed)
I seen this patient I scheduled next monady for a office visit to follow up with dexa. I do not see she has done that or been sch.

## 2020-12-02 NOTE — Telephone Encounter (Signed)
Pt scheduled for dexa scan on 12/11/20 at 12:30p.m. and appointment with our office has been rescheduled for 12/23/20 at 10:30a.m. Pt made aware.

## 2020-12-03 ENCOUNTER — Other Ambulatory Visit: Payer: Self-pay

## 2020-12-03 DIAGNOSIS — E039 Hypothyroidism, unspecified: Secondary | ICD-10-CM

## 2020-12-03 DIAGNOSIS — E559 Vitamin D deficiency, unspecified: Secondary | ICD-10-CM

## 2020-12-09 ENCOUNTER — Ambulatory Visit: Payer: Medicare Other | Admitting: "Endocrinology

## 2020-12-10 ENCOUNTER — Other Ambulatory Visit: Payer: Self-pay

## 2020-12-10 DIAGNOSIS — E039 Hypothyroidism, unspecified: Secondary | ICD-10-CM | POA: Diagnosis not present

## 2020-12-10 DIAGNOSIS — E785 Hyperlipidemia, unspecified: Secondary | ICD-10-CM

## 2020-12-10 DIAGNOSIS — E559 Vitamin D deficiency, unspecified: Secondary | ICD-10-CM | POA: Diagnosis not present

## 2020-12-11 ENCOUNTER — Ambulatory Visit (HOSPITAL_COMMUNITY)
Admission: RE | Admit: 2020-12-11 | Discharge: 2020-12-11 | Disposition: A | Discharge status: Home / Self Care | Payer: Medicare Other | Source: Ambulatory Visit | Attending: "Endocrinology | Admitting: "Endocrinology

## 2020-12-11 DIAGNOSIS — M810 Age-related osteoporosis without current pathological fracture: Secondary | ICD-10-CM | POA: Diagnosis not present

## 2020-12-11 DIAGNOSIS — M8589 Other specified disorders of bone density and structure, multiple sites: Secondary | ICD-10-CM | POA: Diagnosis not present

## 2020-12-11 DIAGNOSIS — Z780 Asymptomatic menopausal state: Secondary | ICD-10-CM | POA: Diagnosis not present

## 2020-12-11 LAB — COMPREHENSIVE METABOLIC PANEL
ALT: 10 IU/L (ref 0–32)
AST: 17 IU/L (ref 0–40)
Albumin/Globulin Ratio: 2.3 — ABNORMAL HIGH (ref 1.2–2.2)
Albumin: 4.5 g/dL (ref 3.7–4.7)
Alkaline Phosphatase: 74 IU/L (ref 44–121)
BUN/Creatinine Ratio: 18 (ref 12–28)
BUN: 12 mg/dL (ref 8–27)
Bilirubin Total: 0.5 mg/dL (ref 0.0–1.2)
CO2: 24 mmol/L (ref 20–29)
Calcium: 9.5 mg/dL (ref 8.7–10.3)
Chloride: 105 mmol/L (ref 96–106)
Creatinine, Ser: 0.66 mg/dL (ref 0.57–1.00)
Globulin, Total: 2 g/dL (ref 1.5–4.5)
Glucose: 95 mg/dL (ref 65–99)
Potassium: 4.2 mmol/L (ref 3.5–5.2)
Sodium: 145 mmol/L — ABNORMAL HIGH (ref 134–144)
Total Protein: 6.5 g/dL (ref 6.0–8.5)
eGFR: 92 mL/min/{1.73_m2} (ref >59)

## 2020-12-11 LAB — CBC
Hematocrit: 43.5 % (ref 34.0–46.6)
Hemoglobin: 14.5 g/dL (ref 11.1–15.9)
MCH: 29.9 pg (ref 26.6–33.0)
MCHC: 33.3 g/dL (ref 31.5–35.7)
MCV: 90 fL (ref 79–97)
Platelets: 257 10*3/uL (ref 150–450)
RBC: 4.85 x10E6/uL (ref 3.77–5.28)
RDW: 12.8 % (ref 11.7–15.4)
WBC: 5.9 10*3/uL (ref 3.4–10.8)

## 2020-12-11 LAB — VITAMIN D 25 HYDROXY (VIT D DEFICIENCY, FRACTURES): Vit D, 25-Hydroxy: 39.7 ng/mL (ref 30.0–100.0)

## 2020-12-11 LAB — LIPID PANEL
Chol/HDL Ratio: 2.2 ratio (ref 0.0–4.4)
Cholesterol, Total: 167 mg/dL (ref 100–199)
HDL: 76 mg/dL (ref >39)
LDL Chol Calc (NIH): 75 mg/dL (ref 0–99)
Triglycerides: 90 mg/dL (ref 0–149)
VLDL Cholesterol Cal: 16 mg/dL (ref 5–40)

## 2020-12-11 LAB — T4, FREE: Free T4: 1.35 ng/dL (ref 0.82–1.77)

## 2020-12-11 LAB — TSH: TSH: 2.02 u[IU]/mL (ref 0.450–4.500)

## 2020-12-16 ENCOUNTER — Ambulatory Visit: Payer: Medicare Other | Admitting: "Endocrinology

## 2020-12-23 ENCOUNTER — Other Ambulatory Visit: Payer: Self-pay

## 2020-12-23 ENCOUNTER — Encounter: Payer: Self-pay | Admitting: "Endocrinology

## 2020-12-23 ENCOUNTER — Ambulatory Visit (INDEPENDENT_AMBULATORY_CARE_PROVIDER_SITE_OTHER): Payer: Medicare Other | Admitting: "Endocrinology

## 2020-12-23 VITALS — BP 142/78 | HR 68 | Ht 67.0 in | Wt 136.6 lb

## 2020-12-23 DIAGNOSIS — E559 Vitamin D deficiency, unspecified: Secondary | ICD-10-CM

## 2020-12-23 DIAGNOSIS — E782 Mixed hyperlipidemia: Secondary | ICD-10-CM | POA: Diagnosis not present

## 2020-12-23 DIAGNOSIS — M810 Age-related osteoporosis without current pathological fracture: Secondary | ICD-10-CM | POA: Diagnosis not present

## 2020-12-23 DIAGNOSIS — E039 Hypothyroidism, unspecified: Secondary | ICD-10-CM | POA: Diagnosis not present

## 2020-12-23 MED ORDER — ROSUVASTATIN CALCIUM 5 MG PO TABS: 5.0000 mg | ORAL_TABLET | Freq: Every day | ORAL | 3 refills | Status: DC

## 2020-12-23 MED ORDER — LEVOTHYROXINE SODIUM 50 MCG PO TABS: ORAL_TABLET | ORAL | 3 refills | Status: DC

## 2020-12-23 NOTE — Progress Notes (Signed)
12/23/2020      Endocrinology follow-up note   Subjective:    Patient ID: Amanda Harmon, female    DOB: June 09, 1946, PCP Fayrene Helper, MD   Past Medical History:  Diagnosis Date  . 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 Cystitis   . Hyperlipidemia    borderline   . Mitral valve prolapse 1984  . Thyroid disease 2015   hypothyroidism  . Vertigo    Past Surgical History:  Procedure Laterality Date  . BACK SURGERY  2008  . BREAST BIOPSY  1998   left - benign  . CARPECTOMY HAND Right 06/22/2016  . COLONOSCOPY N/A 01/17/2015   Procedure: COLONOSCOPY;  Surgeon: Rogene Houston, MD;  Location: [facility];  Service: Endoscopy;  Laterality: N/A;  830 - moved to 4/21 @ 8:30  . EYE SURGERY Left 12/01/2013   cataract  . EYE SURGERY Right 12/15/2013   cataract  . IR GENERIC HISTORICAL  08/19/2016   IR RADIOLOGIST EVAL & MGMT 08/19/2016 [facility]  . SPINE SURGERY    . TONSILLECTOMY  1965  . TUBAL LIGATION  1983   Social History   Socioeconomic History  . Marital status: Married    Spouse name: Chrissie Noa  . Number of children: 3  . Years of education: master's degree  . Highest education level: Master's degree (e.g., MA, MS, MEng, MEd, MSW, MBA)  Occupational History  . Occupation: Arts development officer: RETIRED  Tobacco Use  . Smoking status: Former Smoker    Packs/day: 1.00    Years: 4.00    Pack years: 4.00    Quit date: 12/15/1968    Years since quitting: 52.0  . Smokeless tobacco: Never Used  Vaping Use  . Vaping Use: Never used  Substance and Sexual Activity  . Alcohol use: Yes    Comment: socially   . Drug use: No  . Sexual activity: Yes  Other Topics Concern  . Not on file  Social History Narrative  . Not on file   Social Determinants of Health   Financial Resource Strain: Low Risk   . Difficulty of Paying Living Expenses: Not very hard  Food Insecurity: No Food Insecurity  . Worried About Charity fundraiser in the  Last Year: Never true  . Ran Out of Food in the Last Year: Never true  Transportation Needs: No Transportation Needs  . Lack of Transportation (Medical): No  . Lack of Transportation (Non-Medical): No  Physical Activity: Sufficiently Active  . Days of Exercise per Week: 5 days  . Minutes of Exercise per Session: 60 min  Stress: Stress Concern Present  . Feeling of Stress : To some extent  Social Connections: Socially Integrated  . Frequency of Communication with Friends and Family: More than three times a week  . Frequency of Social Gatherings with Friends and Family: Twice a week  . Attends Religious Services: More than 4 times per year  . Active Member of Clubs or Organizations: Yes  . Attends Archivist Meetings: More than 4 times per year  . Marital Status: Married   Outpatient Encounter Medications as of 12/23/2020  Medication Sig  . alendronate (FOSAMAX) 70 MG tablet TAKE 1 TABLET BY MOUTH WEEKLY WITH A FULL GLASS OF WATER ON AN EMPTY STOMACH  . Ascorbic Acid (VITAMIN C) 1000 MG tablet Take 1,000 mg by mouth daily.  . Calcium Carbonate-Vit D-Min (CALCIUM 1200 PO) Take 1 tablet by mouth  daily.  . ketotifen (ZADITOR) 0.025 % ophthalmic solution Place 1 drop into both eyes every morning. (Patient not taking: Reported on 12/23/2020)  . levothyroxine (SYNTHROID) 50 MCG tablet TAKE 1 TABLET BY MOUTH EVERY DAY IN THE MORNING  . rosuvastatin (CRESTOR) 5 MG tablet Take 1 tablet (5 mg total) by mouth daily.  . [DISCONTINUED] levothyroxine (SYNTHROID) 50 MCG tablet TAKE 1 TABLET BY MOUTH EVERY DAY IN THE MORNING  . [DISCONTINUED] rosuvastatin (CRESTOR) 5 MG tablet Take 1 tablet (5 mg total) by mouth daily.  . [DISCONTINUED] sulfamethoxazole-trimethoprim (BACTRIM DS) 800-160 MG tablet Take 1 tablet by mouth as directed. (Patient not taking: Reported on 07/18/2020)   No facility-administered encounter medications on file as of 12/23/2020.   ALLERGIES: Allergies  Allergen Reactions   . Aspirin Other (See Comments)    Bruising  . Poison Oak Extract [Poison Oak Extract] Rash   VACCINATION STATUS: Immunization History  Administered Date(s) Administered  . Fluad Quad(high Dose 65+) 07/19/2020  . Influenza Split 06/17/2014, 07/23/2017  . Influenza, High Dose Seasonal PF 07/05/2018  . Influenza,inj,Quad PF,6+ Mos 07/20/2013, 07/01/2015, 07/15/2016  . Moderna Sars-Covid-2 Vaccination 11/02/2019, 12/05/2019  . Pneumococcal Conjugate-13 10/30/2014  . Pneumococcal Polysaccharide-23 03/23/2012  . Tdap 12/09/2012  . Zoster 02/24/2007    HPI  75 year old female patient with medical history as above.  She is returning for follow-up in the management of her hypothyroidism, hyperlipidemia and osteoporosis with repeat  DXA scan and  TFTs. For hypothyroidism, she remains on levothyroxine 50 mcg p.o. daily before breakfast.  She continues to tolerate very well.  She has no new complaints.  Her previsit thyroid function tests are consistent with appropriate replacement.    - she remains on alendronate 70 mg by mouth weekly by Dr. Moshe Cipro her primary medical doctor-approximately 5 years.  She has history of hand fracture 2 years ago.  Currently no interval problems, she had at least one episode of fall with no fractures.     -Her most recent DXA scan in December 11, 2020  was c/w clinically significant improvement in the BMD of the spine, stable BMD in hip.  - She has history of compression fracture of first lumbar vertebra. No recent height change. She denies hx of goiter. Her last menstrual period was at age 58. she has 3 grown children. she smoked for only 4 years in college. she never took steroids on chronic basis. she denies cold/heat intolerance. she is active , exercises regularly. She was started on low-dose Crestor for dyslipidemia, her repeat previsit fasting lipid panel showed significant improvement in LDL to 75 from 126.  Review of Systems Limited as  above.  Objective:    BP (!) 142/78   Pulse 68   Ht _0  (1.702 m)   Wt 136 lb 9.6 oz (62 kg)   BMI 21.39 kg/m   Wt Readings from Last 3 Encounters:  12/23/20 136 lb 9.6 oz (62 kg)  07/18/20 131 lb (59.4 kg)  04/09/20 131 lb 9.6 oz (59.7 kg)    Physical Exam   CMP     Component Value Date/Time   NA 145 (H) 12/10/2020 0847   K 4.2 12/10/2020 0847   CL 105 12/10/2020 0847   CO2 24 12/10/2020 0847   GLUCOSE 95 12/10/2020 0847   GLUCOSE 93 04/08/2020 0846   BUN 12 12/10/2020 0847   CREATININE 0.66 12/10/2020 0847   CREATININE 0.68 04/08/2020 0846   CALCIUM 9.5 12/10/2020 0847   PROT 6.5 12/10/2020 0847  ALBUMIN 4.5 12/10/2020 0847   AST 17 12/10/2020 0847   ALT 10 12/10/2020 0847   ALKPHOS 74 12/10/2020 0847   BILITOT 0.5 12/10/2020 0847   GFRNONAA 90 10/03/2019 0932   GFRAA 104 10/03/2019 0932   Lipid Panel     Component Value Date/Time   CHOL 167 12/10/2020 0848   TRIG 90 12/10/2020 0848   HDL 76 12/10/2020 0848   CHOLHDL 2.2 12/10/2020 0848   CHOLHDL 3.3 04/08/2020 0854   VLDL 24 12/08/2016 0829   LDLCALC 75 12/10/2020 0848   LDLCALC 126 (H) 04/08/2020 0854   June 28, 2018 labs TSH 2.9 7, free T4 1.2  Recent Results (from the past 2160 hour(s))  Novel Coronavirus, NAA ([facility])     Status: None   Collection Time: 10/10/20 11:37 AM   Specimen: Nasopharyngeal(NP) swabs in vial transport medium   Nasopharynge  Screenin  Result Value Ref Range   SARS-CoV-2, NAA Not Detected Not Detected    Comment: This nucleic acid amplification test was developed and its performance characteristics determined by [employer]. Nucleic acid amplification tests include RT-PCR and TMA. This test has not been FDA cleared or approved. This test has been authorized by FDA under an Emergency Use Authorization (EUA). This test is only authorized for the duration of time the declaration that circumstances exist justifying the authorization of the emergency use of in  vitro diagnostic tests for detection of SARS-CoV-2 virus and/or diagnosis of COVID-19 infection under section 564(b)(1) of the Act, 21 U.S.C. 578ION-6(E) (1), unless the authorization is terminated or revoked sooner. When diagnostic testing is negative, the possibility of a false negative result should be considered in the context of a patient's recent exposures and the presence of clinical signs and symptoms consistent with COVID-19. An individual without symptoms of COVID-19 and who is not shedding SARS-CoV-2 virus wo uld expect to have a negative (not detected) result in this assay.   Comprehensive metabolic panel     Status: Abnormal   Collection Time: 12/10/20  8:47 AM  Result Value Ref Range   Glucose 95 65 - 99 mg/dL   BUN 12 8 - 27 mg/dL   Creatinine, Ser 0.66 0.57 - 1.00 mg/dL   eGFR 92 >59 mL/min/1.73   BUN/Creatinine Ratio 18 12 - 28   Sodium 145 (H) 134 - 144 mmol/L   Potassium 4.2 3.5 - 5.2 mmol/L   Chloride 105 96 - 106 mmol/L   CO2 24 20 - 29 mmol/L   Calcium 9.5 8.7 - 10.3 mg/dL   Total Protein 6.5 6.0 - 8.5 g/dL   Albumin 4.5 3.7 - 4.7 g/dL   Globulin, Total 2.0 1.5 - 4.5 g/dL   Albumin/Globulin Ratio 2.3 (H) 1.2 - 2.2   Bilirubin Total 0.5 0.0 - 1.2 mg/dL   Alkaline Phosphatase 74 44 - 121 IU/L   AST 17 0 - 40 IU/L   ALT 10 0 - 32 IU/L  T4, Free     Status: None   Collection Time: 12/10/20  8:47 AM  Result Value Ref Range   Free T4 1.35 0.82 - 1.77 ng/dL  TSH     Status: None   Collection Time: 12/10/20  8:47 AM  Result Value Ref Range   TSH 2.020 0.450 - 4.500 uIU/mL  Vitamin D, 25-hydroxy     Status: None   Collection Time: 12/10/20  8:47 AM  Result Value Ref Range   Vit D, 25-Hydroxy 39.7 30.0 - 100.0 ng/mL    Comment:  Vitamin D deficiency has been defined by the [place] practice guideline as a level of serum 25-OH vitamin D less than 20 ng/mL (1,2). The Endocrine Society went on to further define vitamin  D insufficiency as a level between 21 and 29 ng/mL (2). 1. IOM (Institute of Medicine). 2010. Dietary reference    intakes for calcium and D. [employer]: The    [employer]. 2. Holick MF, Binkley [place], Bischoff-Ferrari HA, et al.    Evaluation, treatment, and prevention of vitamin D    deficiency: an Endocrine Society clinical practice    guideline. JCEM. 2011 Jul; 96(7):1911-30.   Lipid panel     Status: None   Collection Time: 12/10/20  8:48 AM  Result Value Ref Range   Cholesterol, Total 167 100 - 199 mg/dL   Triglycerides 90 0 - 149 mg/dL   HDL 76 >39 mg/dL   VLDL Cholesterol Cal 16 5 - 40 mg/dL   LDL Chol Calc (NIH) 75 0 - 99 mg/dL   Chol/HDL Ratio 2.2 0.0 - 4.4 ratio    Comment:                                   T. Chol/HDL Ratio                                             Men  Women                               1/2 Avg.Risk  3.4    3.3                                   Avg.Risk  5.0    4.4                                2X Avg.Risk  9.6    7.1                                3X Avg.Risk 23.4   11.0   CBC     Status: None   Collection Time: 12/10/20  8:48 AM  Result Value Ref Range   WBC 5.9 3.4 - 10.8 x10E3/uL   RBC 4.85 3.77 - 5.28 x10E6/uL   Hemoglobin 14.5 11.1 - 15.9 g/dL   Hematocrit 43.5 34.0 - 46.6 %   MCV 90 79 - 97 fL   MCH 29.9 26.6 - 33.0 pg   MCHC 33.3 31.5 - 35.7 g/dL   RDW 12.8 11.7 - 15.4 %   Platelets 257 150 - 450 x10E3/uL    Latest DEXA on December 11, 2020: Compared to her last 2 measurements as follows  AP Spine L2-L4 12/11/2020 74.3 Osteopenia -1.8 0.988 g/cm2 0.9% - [facility] Spine L2-L4 11/03/2018 72.2 Osteopenia -1.8 0.979 g/cm2 0.1% - AP Spine L2-L4 08/05/2016 69.9 Osteopenia -1.8 0.978 g/cm2 9.5% Yes  DualFemur Neck Right 12/11/2020 74.3 Osteopenia -1.8 0.793 g/cm2 3.3% - DualFemur Neck Right 11/03/2018 72.2 Osteopenia -1.9 0.768 g/cm2 -3.6% - DualFemur  Neck Right 08/05/2016 69.9 Osteopenia -1.7 0.797 g/cm2 1.5% -  DualFemur  Total Mean 12/11/2020 74.3 Osteopenia -1.6 0.808 g/cm2 -0.6% - DualFemur Total Mean 11/03/2018 72.2 Osteopenia -1.5 0.813 g/cm2 1.9% - DualFemur Total Mean 08/05/2016 69.9 Osteopenia -1.7 0.798 g/cm2 0.5% -    Assessment & Plan:   1.  hypothyroidism Her thyroid function test from December 10, 2020 were consistent with appropriate replacement.  She is advised to continue levothyroxine 50 mcg p.o. daily before breakfast.     - We discussed about the correct intake of her thyroid hormone, on empty stomach at fasting, with water, separated by at least 30 minutes from breakfast and other medications,  and separated by more than 4 hours from calcium, iron, multivitamins, acid reflux medications (PPIs). -Patient is made aware of the fact that thyroid hormone replacement is needed for life, dose to be adjusted by periodic monitoring of thyroid function tests.    2. Osteoporosis  - she is tolerating and benefiting from  alendronate 70 mg by mouth weekly.  - Based on her most recent bone density, she continued to gain some bone mass on the spine compared to prior study,  and no change on  Dual Femur BMD analysis. -This is better than expected outcome of therapy with alendronate.   -  I encouraged her to stay on  alendronate 70 mg weekly for now. She has a propensity for falling (fell once since last visit with no fractures), and  I advised her on precautions to avoid falling including walking on flat surface or treadmill instead of on outside pavements, and pick up  Strengthening exercises for back muscles. Carotid Doppler was negative in 2020. -She is advised to home monitor blood pressure and address it if it is greater than 140/90 on 2 separate occasions.  -She is advised to continue vitamin D3  2,000 units daily . -She will continue with calcium carbonate 1200 mg daily with lunch.   -If her next  BMD response is not satisfactory, Or if she develops interim fracture, she will be considered for  Prolia. -Her next bone density will be done in March 2024.    3. Hyperlipidemia:  - She has family history of hyperlipidemia and coronary artery disease.  She agreed on intervention during her last visit with Crestor 5 mg p.o. nightly.  She has seen significant improvement in her LDL to 75 from 126.  She is advised to continue same dose of Crestor 5 mg p.o. nightly.   Side effects and precautions discussed with her.  - I advised patient to maintain close follow up with Fayrene Helper, MD for primary care needs.      - Time spent on this patient care encounter:  30 minutes of which 50% was spent in  counseling and the rest reviewing  her current and  previous labs / studies and medications  doses and developing a plan for long term care, and documenting this care. Hendricks Limes  participated in the discussions, expressed understanding, and voiced agreement with the above plans.  All questions were answered to her satisfaction. she is encouraged to contact clinic should she have any questions or concerns prior to her return visit.   Follow up plan: Return in about 1 year (around 12/23/2021) for F/U with Pre-visit Labs.  Glade Lloyd, MD Phone: [phone]  Fax: [phone]   12/23/2020, 1:03 PM

## 2020-12-31 ENCOUNTER — Ambulatory Visit: Payer: Medicare Other | Admitting: Physician Assistant

## 2021-02-11 ENCOUNTER — Encounter: Payer: Self-pay | Admitting: Physician Assistant

## 2021-02-11 NOTE — Progress Notes (Signed)
Cardiology Office Note    Date:  02/12/2021   ID:  Amanda, Harmon Jan 16, 1946, MRN [id number]  PCP:  Fayrene Helper, MD  Cardiologist:  Lauree Chandler, MD  Electrophysiologist:  None   Chief Complaint: f/u HLD, HTN  History of Present Illness:   Amanda Harmon is a 75 y.o. female with history of borderline HLD, essential HTN, small pericardial effusion, prior reported MVP, hypothyroidism, right carotid bruit (normal duplex 05/2019), vertigo who presents for cardiac follow-up. She had atypical chest pain in 2011 during a stressful situation and then other episodes of chest pain and dyspnea at rest in 2012. Echo 02/17/11 showed normal LV systolic function, grade 2 diastolic dysfunction. Exercise stress test 01/2011 showed good exercise tolerance and no ischemic EKG changes. Her EKG is reported to chronically show poor R wave progression in the precordial leads not felt to represent a prior infarct. She was seen in the office 05/25/19 with c/o dizziness and episode of elevated HR. Echo 05/2019 showed LVEF=60-65%, no valve disease, normal diastolic parameters, small circumferential pericardial effusion. Carotid artery dopplers done for right carotid bruit in September 2020 showed no evidence of carotid artery disease. She was slightly hypertensive at last cardiology OV with planned outpatient self monitoring of BP. Per MyChart message she was started on Crestor 06/2020 with f/u LDL 75 in 11/2020.  She returns for follow-up clinically doing well. She is originally from [place] but also lived in [place] in the past. No chest pain, dyspnea, palpitations, near syncope or syncope. She has been under a lot of stress. Her daughter underwent a lengthy surgery for a benign brain tumor. They are up in [place] and she spent 6 weeks there. Her mother in law passed away around Christmas and the family unfortunately all developed Covid shortly after her funeral. She has been noticing episodic elevated  BPs at home (majority 130s-140s). She has noticed sporadic quick pinpricks of discomfort in various spots and wonders if this is related to her rosuvastatin.   Labwork independently reviewed: 11/2020 CBC wnl, LDL 75 (PCP), TSH wnl, CMET OK (Na 145), Cr 0.66, K 4.2   Past Medical History:  Diagnosis Date  . 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 Cystitis   . Hyperlipidemia    borderline   . Mitral valve prolapse 1984  . Pericardial effusion   . Thyroid disease 2015   hypothyroidism  . Vertigo     Past Surgical History:  Procedure Laterality Date  . BACK SURGERY  2008  . BREAST BIOPSY  1998   left - benign  . CARPECTOMY HAND Right 06/22/2016  . COLONOSCOPY N/A 01/17/2015   Procedure: COLONOSCOPY;  Surgeon: Rogene Houston, MD;  [facility];  Service: Endoscopy;  Laterality: N/A;  830 - moved to 4/21 @ 8:30  . EYE SURGERY Left 12/01/2013   cataract  . EYE SURGERY Right 12/15/2013   cataract  . IR GENERIC HISTORICAL  08/19/2016   IR RADIOLOGIST EVAL & MGMT 08/19/2016 [facility]  . SPINE SURGERY    . TONSILLECTOMY  1965  . TUBAL LIGATION  1983    Current Medications: Current Meds  Medication Sig  . alendronate (FOSAMAX) 70 MG tablet TAKE 1 TABLET BY MOUTH WEEKLY WITH A FULL GLASS OF WATER ON AN EMPTY STOMACH  . Ascorbic Acid (VITAMIN C) 1000 MG tablet Take 1,000 mg by mouth daily.  . Calcium Carbonate-Vit D-Min (CALCIUM 1200 PO) Take 1 tablet by mouth daily.  Marland Kitchen  ketotifen (ZADITOR) 0.025 % ophthalmic solution Place 1 drop into both eyes every morning.  Marland Kitchen levothyroxine (SYNTHROID) 50 MCG tablet TAKE 1 TABLET BY MOUTH EVERY DAY IN THE MORNING  . rosuvastatin (CRESTOR) 5 MG tablet Take 1 tablet (5 mg total) by mouth daily.      Allergies:   Aspirin and Poison oak extract [poison oak extract]   Social History   Socioeconomic History  . Marital status: Married    Spouse name: Chrissie Noa  . Number of children: 3  . Years of education: master's degree  .  Highest education level: Master's degree (e.g., MA, MS, MEng, MEd, MSW, MBA)  Occupational History  . Occupation: Arts development officer: RETIRED  Tobacco Use  . Smoking status: Former Smoker    Packs/day: 1.00    Years: 4.00    Pack years: 4.00    Quit date: 12/15/1968    Years since quitting: 52.1  . Smokeless tobacco: Never Used  Vaping Use  . Vaping Use: Never used  Substance and Sexual Activity  . Alcohol use: Yes    Comment: socially   . Drug use: No  . Sexual activity: Yes  Other Topics Concern  . Not on file  Social History Narrative  . Not on file   Social Determinants of Health   Financial Resource Strain: Low Risk   . Difficulty of Paying Living Expenses: Not very hard  Food Insecurity: No Food Insecurity  . Worried About Charity fundraiser in the Last Year: Never true  . Ran Out of Food in the Last Year: Never true  Transportation Needs: No Transportation Needs  . Lack of Transportation (Medical): No  . Lack of Transportation (Non-Medical): No  Physical Activity: Sufficiently Active  . Days of Exercise per Week: 5 days  . Minutes of Exercise per Session: 60 min  Stress: Stress Concern Present  . Feeling of Stress : To some extent  Social Connections: Socially Integrated  . Frequency of Communication with Friends and Family: More than three times a week  . Frequency of Social Gatherings with Friends and Family: Twice a week  . Attends Religious Services: More than 4 times per year  . Active Member of Clubs or Organizations: Yes  . Attends Archivist Meetings: More than 4 times per year  . Marital Status: Married     Family History:  The patient's family history includes Alcohol abuse in her father; Arthritis in her mother; Cancer (age of onset: 39) in her brother; Heart attack (age of onset: 24) in her father; Heart attack (age of onset: 35) in her mother; Osteoarthritis in her sister; Osteoporosis in her sister.  ROS:   Please  see the history of present illness. Mild cough since Covid (instructed to f/u PCP).  All other systems are reviewed and otherwise negative.    EKGs/Labs/Other Studies Reviewed:    Studies reviewed are outlined and summarized above. Reports included below if pertinent.  2d echo 05/2019 1. The left ventricle has normal systolic function with an ejection  fraction of 60-65%. The cavity size was normal. Left ventricular diastolic  parameters were normal. No evidence of left ventricular regional wall  motion abnormalities.  2. The right ventricle has normal systolic function. The cavity was  normal. There is no increase in right ventricular wall thickness. Right  ventricular systolic pressure is normal.  3. Small pericardial effusion.  4. The pericardial effusion is circumferential.  5. No evidence of mitral valve stenosis.  6. The aortic valve is tricuspid. No stenosis of the aortic valve.  7. The aorta is normal unless otherwise noted.  8. The aortic root, ascending aorta and aortic arch are normal in size  and structure.     EKG:  EKG is ordered today, personally reviewed, demonstrating NSR 69bpm, no acute STT changes (anterior progression is similar to prior)  Recent Labs: 12/10/2020: ALT 10; BUN 12; Creatinine, Ser 0.66; Hemoglobin 14.5; Platelets 257; Potassium 4.2; Sodium 145; TSH 2.020  Recent Lipid Panel    Component Value Date/Time   CHOL 167 12/10/2020 0848   TRIG 90 12/10/2020 0848   HDL 76 12/10/2020 0848   CHOLHDL 2.2 12/10/2020 0848   CHOLHDL 3.3 04/08/2020 0854   VLDL 24 12/08/2016 0829   LDLCALC 75 12/10/2020 0848   LDLCALC 126 (H) 04/08/2020 0854    PHYSICAL EXAM:    VS:  BP (!) 162/90   Pulse 69   Ht 5\' 7"  (1.702 m)   Wt 132 lb 3.2 oz (60 kg)   SpO2 96%   BMI 20.71 kg/m   BMI: Body mass index is 20.71 kg/m.  GEN: Well nourished, well developed female in no acute distress HEENT: normocephalic, atraumatic Neck: no JVD, +right carotid bruit, no  left carotid bruit, no masses Cardiac: RRR; no murmurs, rubs, or gallops, no edema  Respiratory:  clear to auscultation bilaterally, normal work of breathing GI: soft, nontender, nondistended, + BS MS: no deformity or atrophy Skin: warm and dry, no rash Neuro:  Alert and Oriented x 3, Strength and sensation are intact, follows commands Psych: euthymic mood, full affect  Wt Readings from Last 3 Encounters:  02/12/21 132 lb 3.2 oz (60 kg)  12/23/20 136 lb 9.6 oz (62 kg)  07/18/20 131 lb (59.4 kg)     ASSESSMENT & PLAN:   1. Essential HTN - Suboptimal blood pressure control noted today as in similar OV. Home values frequently >130-140s. Will start amlodipine 5mg  - since she is naive to antihypertensives with borderline home control, will have her start 1/2 tablet daily. I told her that if her SBP is still >130 after 3 days on this dose to increase to 1 whole tablet daily. The patient was provided instructions on monitoring blood pressure at home for 1 week and relaying results to our office. Also reviewed typical lifestyle modifications as well.  2. Small pericardial effusion - incidentally noted in 2020. She is completely asymptomatic from this standpoint so we will continue to follow clinically. Warning sx reviewed.  3. Hyperlipidemia - atypical pinpoint types of pains periodically, not specifically bothersome, just noted by patient temporally related to starting Crestor. No actual joint aches/myalgias. Given mild degree of symptoms, would continue Crestor at present dose but if this persists/worsens, can consider drug holiday to reassess. Last labs 11/2020 reviewed.  4. Prior mitral valve prolapse - per pt, dx age 10. Not seen on subsequent echocardiogram.  Disposition: F/u with Dr. Angelena Form in 1 year.  Medication Adjustments/Labs and Tests Ordered: Current medicines are reviewed at length with the patient today.  Concerns regarding medicines are outlined above. Medication changes, Labs  and Tests ordered today are summarized above and listed in the Patient Instructions accessible in Encounters.   Signed, Charlie Pitter, PA-C  02/12/2021 2:17 PM    [facility] [address], [place], [place]  [address] Phone: [phone]; Fax: [phone]

## 2021-02-12 ENCOUNTER — Ambulatory Visit (INDEPENDENT_AMBULATORY_CARE_PROVIDER_SITE_OTHER): Payer: Medicare Other | Admitting: Physician Assistant

## 2021-02-12 ENCOUNTER — Encounter: Payer: Self-pay | Admitting: Physician Assistant

## 2021-02-12 ENCOUNTER — Other Ambulatory Visit: Payer: Self-pay

## 2021-02-12 VITALS — BP 162/90 | HR 69 | Ht 67.0 in | Wt 132.2 lb

## 2021-02-12 DIAGNOSIS — E785 Hyperlipidemia, unspecified: Secondary | ICD-10-CM | POA: Diagnosis not present

## 2021-02-12 DIAGNOSIS — I10 Essential (primary) hypertension: Secondary | ICD-10-CM

## 2021-02-12 DIAGNOSIS — I313 Pericardial effusion (noninflammatory): Secondary | ICD-10-CM

## 2021-02-12 DIAGNOSIS — Z8679 Personal history of other diseases of the circulatory system: Secondary | ICD-10-CM

## 2021-02-12 DIAGNOSIS — I3139 Other pericardial effusion (noninflammatory): Secondary | ICD-10-CM

## 2021-02-12 MED ORDER — AMLODIPINE BESYLATE 5 MG PO TABS: 5.0000 mg | ORAL_TABLET | ORAL | 1 refills | Status: DC

## 2021-02-12 NOTE — Patient Instructions (Addendum)
Medication Instructions:   Your physician has recommended you make the following change in your medication:   1.  Start Amlodipine one half tablet by mouth ( 2.5 mg) daily than increase to one tablet by mouth ( 5 mg) daily  If top        Number ( systolic) is still over 163 consistently X 3 days on medication.  Sent in # 90 as directed to requested      pharmacy.   *If you need a refill on your cardiac medications before your next appointment, please call your pharmacy*   Lab Work: -None  If you have labs (blood work) drawn today and your tests are completely normal, you will receive your results only by: Marland Kitchen MyChart Message (if you have MyChart) OR . A paper copy in the mail If you have any lab test that is abnormal or we need to change your treatment, we will call you to review the results.   Testing/Procedures: -None   Follow-Up: At [facility], you and your health needs are our priority.  As part of our continuing mission to provide you with exceptional heart care, we have created designated Provider Care Teams.  These Care Teams include your primary Cardiologist (physician) and Advanced Practice Providers (APPs -  Physician Assistants and Nurse Practitioners) who all work together to provide you with the care you need, when you need it.  We recommend signing up for the patient portal called "MyChart".  Sign up information is provided on this After Visit Summary.  MyChart is used to connect with patients for Virtual Visits (Telemedicine).  Patients are able to view lab/test results, encounter notes, upcoming appointments, etc.  Non-urgent messages can be sent to your provider as well.   To learn more about what you can do with MyChart, go to [email or web address].    Your next appointment:   1 year(s)  The format for your next appointment:   In Person  Provider:   Lauree Chandler, MD   Other Instructions Your physician wants you to follow-up in: 1 year with Dr.  Angelena Form.  You will receive a reminder letter in the mail two months in advance. If you don't receive a letter, please call our office to schedule the follow-up appointment.  To check your blood pressure, choose a time at least 3 hours after taking your blood pressure medicines. If you can sample it at different times of the day, that's great - it might give you more information about how your blood pressure fluctuates. Remain seated in a chair for 5 minutes quietly beforehand, then check it. Please record a list of those readings and call us/send in MyChart message with them for our review in 1 week.

## 2021-02-25 ENCOUNTER — Other Ambulatory Visit: Payer: Self-pay | Admitting: Family Medicine

## 2021-03-03 ENCOUNTER — Other Ambulatory Visit: Payer: Self-pay | Admitting: "Endocrinology

## 2021-03-03 ENCOUNTER — Other Ambulatory Visit: Payer: Self-pay | Admitting: Family Medicine

## 2021-03-03 DIAGNOSIS — M810 Age-related osteoporosis without current pathological fracture: Secondary | ICD-10-CM

## 2021-03-11 ENCOUNTER — Telehealth: Payer: Self-pay

## 2021-03-11 ENCOUNTER — Encounter: Payer: Self-pay | Admitting: Family Medicine

## 2021-03-11 ENCOUNTER — Other Ambulatory Visit: Payer: Self-pay | Admitting: Nurse Practitioner

## 2021-03-11 MED ORDER — SULFAMETHOXAZOLE-TRIMETHOPRIM 800-160 MG PO TABS: 1.0000 | ORAL_TABLET | Freq: Two times a day (BID) | ORAL | 0 refills | Status: DC | PRN

## 2021-03-11 NOTE — Telephone Encounter (Signed)
Called patient left voicemail to call office to schedule appointment!  

## 2021-03-11 NOTE — Telephone Encounter (Signed)
Patient called need med refill  Sulfamethoxazole-tmp ds tablet   Pharmacy: [facility]

## 2021-03-11 NOTE — Telephone Encounter (Signed)
Can you refill this for pt or what do you advise?

## 2021-03-11 NOTE — Telephone Encounter (Signed)
This is an antibiotic please ask what pt needs this for may need a visit

## 2021-03-11 NOTE — Telephone Encounter (Signed)
Pt informed

## 2021-04-04 DIAGNOSIS — Z23 Encounter for immunization: Secondary | ICD-10-CM | POA: Diagnosis not present

## 2021-05-06 ENCOUNTER — Telehealth: Payer: Self-pay | Admitting: Family Medicine

## 2021-05-06 NOTE — Telephone Encounter (Signed)
Please place orders for Mammogram for [facility],   Once placed the appt needs to be made

## 2021-05-07 ENCOUNTER — Other Ambulatory Visit: Payer: Self-pay

## 2021-05-07 DIAGNOSIS — Z1231 Encounter for screening mammogram for malignant neoplasm of breast: Secondary | ICD-10-CM

## 2021-05-07 NOTE — Telephone Encounter (Signed)
Ordered. Just need to schedule.

## 2021-05-08 NOTE — Telephone Encounter (Signed)
Can you schedule this for pt please and call her with appt info.

## 2021-05-12 NOTE — Telephone Encounter (Signed)
Appt made

## 2021-05-20 ENCOUNTER — Other Ambulatory Visit: Payer: Self-pay | Admitting: Family Medicine

## 2021-05-20 DIAGNOSIS — Z1231 Encounter for screening mammogram for malignant neoplasm of breast: Secondary | ICD-10-CM

## 2021-05-21 ENCOUNTER — Ambulatory Visit (HOSPITAL_COMMUNITY)
Admission: RE | Admit: 2021-05-21 | Discharge: 2021-05-21 | Disposition: A | Discharge status: Home / Self Care | Payer: Medicare Other | Source: Ambulatory Visit | Attending: Family Medicine | Admitting: Family Medicine

## 2021-05-21 ENCOUNTER — Other Ambulatory Visit: Payer: Self-pay

## 2021-05-21 DIAGNOSIS — Z1231 Encounter for screening mammogram for malignant neoplasm of breast: Secondary | ICD-10-CM | POA: Diagnosis not present

## 2021-07-03 ENCOUNTER — Encounter: Payer: 59 | Admitting: Family Medicine

## 2021-07-10 ENCOUNTER — Telehealth: Payer: Self-pay | Admitting: Family Medicine

## 2021-07-10 NOTE — Telephone Encounter (Signed)
LM with new appt info- rescheduled AWV to [facility] schedule

## 2021-07-16 DIAGNOSIS — Z23 Encounter for immunization: Secondary | ICD-10-CM | POA: Diagnosis not present

## 2021-07-22 ENCOUNTER — Telehealth: Payer: Medicare Other | Admitting: Family Medicine

## 2021-07-22 MED ORDER — AMLODIPINE BESYLATE 2.5 MG PO TABS: 2.5000 mg | ORAL_TABLET | Freq: Every day | ORAL | 3 refills | Status: DC

## 2021-07-22 NOTE — Addendum Note (Signed)
Addended by: Gaetano Net on: 07/22/2021 02:29 PM   Modules accepted: Orders

## 2021-08-04 DIAGNOSIS — Z20822 Contact with and (suspected) exposure to covid-19: Secondary | ICD-10-CM | POA: Diagnosis not present

## 2021-08-06 ENCOUNTER — Other Ambulatory Visit: Payer: Self-pay

## 2021-08-06 ENCOUNTER — Ambulatory Visit (INDEPENDENT_AMBULATORY_CARE_PROVIDER_SITE_OTHER): Payer: Medicare Other | Admitting: *Deleted

## 2021-08-06 DIAGNOSIS — Z0000 Encounter for general adult medical examination without abnormal findings: Secondary | ICD-10-CM | POA: Diagnosis not present

## 2021-08-06 NOTE — Progress Notes (Signed)
Subjective:   Amanda Harmon is a 75 y.o. female who presents for Medicare Annual (Subsequent) preventive examination. I connected with  Amanda Harmon on 08/06/21 by audio enabled telemedicine application and verified that I am speaking with the correct person using two identifiers.   I discussed the limitations of evaluation and management by telemedicine. The patient expressed understanding and agreed to proceed.   Review of Systems    NA       Objective:    There were no vitals filed for this visit. There is no height or weight on file to calculate BMI.  Advanced Directives 07/18/2020 07/18/2019 04/27/2017 04/13/2017 12/15/2016 08/31/2016 02/20/2015  Does Patient Have a Medical Advance Directive? No Yes No No No No No  Type of Advance Directive - Healthcare Power of [place];Living will - - - - -  Copy of [place] in Chart? - No - copy requested - - - - -  Would patient like information on creating a medical advance directive? No - Patient declined - No - Patient declined No - Patient declined Yes (MAU/Ambulatory/Procedural Areas - Information given) No - Patient declined No - patient declined information    Current Medications (verified) Outpatient Encounter Medications as of 08/06/2021  Medication Sig   alendronate (FOSAMAX) 70 MG tablet TAKE 1 TABLET BY MOUTH WEEKLY WITH A FULL GLASS OF WATER ON AN EMPTY STOMACH   amLODipine (NORVASC) 2.5 MG tablet Take 1 tablet (2.5 mg total) by mouth daily.   Ascorbic Acid (VITAMIN C) 1000 MG tablet Take 1,000 mg by mouth daily.   Calcium Carbonate-Vit D-Min (CALCIUM 1200 PO) Take 1 tablet by mouth daily.   ketotifen (ZADITOR) 0.025 % ophthalmic solution Place 1 drop into both eyes every morning.   levothyroxine (SYNTHROID) 50 MCG tablet TAKE 1 TABLET BY MOUTH EVERY DAY IN THE MORNING   rosuvastatin (CRESTOR) 5 MG tablet Take 1 tablet (5 mg total) by mouth daily.   sulfamethoxazole-trimethoprim (BACTRIM DS) 800-160 MG  tablet Take 1 tablet by mouth 2 (two) times daily as needed.   No facility-administered encounter medications on file as of 08/06/2021.    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    Cystitis    Hyperlipidemia    borderline    Mitral valve prolapse 1984   Pericardial effusion    Thyroid disease 2015   hypothyroidism   Vertigo    Past Surgical History:  Procedure Laterality Date   BACK SURGERY  2008   BREAST BIOPSY  1998   left - benign   CARPECTOMY HAND Right 06/22/2016   COLONOSCOPY N/A 01/17/2015   Procedure: COLONOSCOPY;  Surgeon: Rogene Houston, MD;  [facility];  Service: Endoscopy;  Laterality: N/A;  830 - moved to 4/21 @ 8:30   EYE SURGERY Left 12/01/2013   cataract   EYE SURGERY Right 12/15/2013   cataract   IR GENERIC HISTORICAL  08/19/2016   IR RADIOLOGIST EVAL & MGMT 08/19/2016 [facility]   SPINE SURGERY     TONSILLECTOMY  1965   TUBAL LIGATION  1983   Family History  Problem Relation Age of Onset   Heart attack Mother 46       used tobacco    Arthritis Mother        severe    Heart attack Father 25  used tobacco    Alcohol abuse Father    Cancer Brother 69       prostate    Osteoporosis Sister        severe    Osteoarthritis Sister    Social History   Socioeconomic History   Marital status: Married    Spouse name: Chrissie Noa   Number of children: 3   Years of education: master's degree   Highest education level: Master's degree (e.g., MA, MS, MEng, MEd, MSW, MBA)  Occupational History   Occupation: Arts development officer: RETIRED  Tobacco Use   Smoking status: Former    Packs/day: 1.00    Years: 4.00    Pack years: 4.00    Types: Cigarettes    Quit date: 12/15/1968    Years since quitting: 52.6   Smokeless tobacco: Never  Vaping Use   Vaping Use: Never used  Substance and Sexual  Activity   Alcohol use: Yes    Comment: socially    Drug use: No   Sexual activity: Yes  Other Topics Concern   Not on file  Social History Narrative   Not on file   Social Determinants of Health   Financial Resource Strain: Not on file  Food Insecurity: Not on file  Transportation Needs: Not on file  Physical Activity: Not on file  Stress: Not on file  Social Connections: Not on file    Tobacco Counseling Counseling given: Not Answered   Clinical Intake:                 Diabetic?No         Activities of Daily Living No flowsheet data found.  Patient Care Team: Fayrene Helper, MD as PCP - General (Family Medicine) Burnell Blanks, MD as PCP - Cardiology (Cardiology) Iran Planas, MD as Consulting Physician (Orthopedic Surgery) Burnell Blanks, MD as Consulting Physician (Cardiology)  Indicate any recent Medical Services you may have received from other than [facility] providers in the past year (date may be approximate).     Assessment:   This is a routine wellness examination for Amanda Harmon.  Hearing/Vision screen No results found.  Dietary issues and exercise activities discussed:     Goals Addressed   None   Depression Screen PHQ 2/9 Scores 07/18/2020 07/18/2020 03/12/2020 07/18/2019 02/23/2019 07/13/2018 10/20/2017  PHQ - 2 Score 0 0 0 0 0 0 0  PHQ- 9 Score - - - - - - 0    Fall Risk Fall Risk  07/18/2020 03/12/2020 07/18/2019 02/23/2019 08/15/2018  Falls in the past year? 0 0 0 0 0  Comment - - - - Emmi Telephone Survey: data to providers prior to load  Number falls in past yr: 0 - 0 0 -  Injury with Fall? 0 - 0 0 -  Risk for fall due to : No Fall Risks - - - -  Follow up Falls evaluation completed - Falls evaluation completed;Education provided - -  Comment - - - - -    FALL RISK PREVENTION PERTAINING TO THE HOME:  Any stairs in or around the home? Yes  If so, are there any without handrails? No  Home free of loose  throw rugs in walkways, pet beds, electrical cords, etc? Yes  Adequate lighting in your home to reduce risk of falls? Yes   ASSISTIVE DEVICES UTILIZED TO PREVENT FALLS:  Life alert? No  Use of a cane, Annali Lybrand or w/c? No  Grab bars in the  bathroom? No  Shower chair or bench in shower? No  Elevated toilet seat or a handicapped toilet? No   TIMED UP AND GO:  Was the test performed? No .  Length of time to ambulate 10 feet: NA sec.     Cognitive Function:     6CIT Screen 07/18/2020 07/18/2019 07/13/2018 12/15/2016  What Year? 0 points 0 points 0 points 0 points  What month? 0 points 0 points 0 points 0 points  What time? 0 points 0 points 0 points 0 points  Count back from 20 0 points 0 points 0 points 0 points  Months in reverse 0 points 0 points 0 points 0 points  Repeat phrase 0 points 0 points 0 points 0 points  Total Score 0 0 0 0    Immunizations Immunization History  Administered Date(s) Administered   Fluad Quad(high Dose 65+) 07/19/2020   Influenza Split 06/17/2014, 07/23/2017   Influenza, High Dose Seasonal PF 07/05/2018   Influenza,inj,Quad PF,6+ Mos 07/20/2013, 07/01/2015, 07/15/2016   Moderna Sars-Covid-2 Vaccination 11/02/2019, 12/05/2019   Pneumococcal Conjugate-13 10/30/2014   Pneumococcal Polysaccharide-23 03/23/2012   Tdap 12/09/2012   Zoster, Live 02/24/2007    TDAP status: Up to date  Flu Vaccine status: Up to date  Pneumococcal vaccine status: Up to date  Covid-19 vaccine status: Completed vaccines  Qualifies for Shingles Vaccine? Yes   Zostavax completed No   Shingrix Completed?: Yes  Screening Tests Health Maintenance  Topic Date Due   COVID-19 Vaccine (3 - Booster for Moderna series) 01/30/2020   INFLUENZA VACCINE  04/28/2021   TETANUS/TDAP  12/10/2022   MAMMOGRAM  05/22/2023   COLONOSCOPY (Pts 45-50yrs Insurance coverage will need to be confirmed)  01/16/2025   Pneumonia Vaccine 38+ Years old  Completed   DEXA SCAN  Completed    Hepatitis C Screening  Completed   Zoster Vaccines- Shingrix  Completed   HPV VACCINES  Aged Out   URINE MICROALBUMIN  Discontinued    Health Maintenance  Health Maintenance Due  Topic Date Due   COVID-19 Vaccine (3 - Booster for Moderna series) 01/30/2020   INFLUENZA VACCINE  04/28/2021    Colorectal cancer screening: Type of screening: Colonoscopy. Completed 01/17/2015. Repeat every 10 years  Mammogram status: Completed 05/21/2021. Repeat every year  Bone Density status: Completed 12/11/2020. Results reflect: Bone density results: OSTEOPENIA. Repeat every 2 years.  Lung Cancer Screening: (Low Dose CT Chest recommended if Age 65-80 years, 30 pack-year currently smoking OR have quit w/in 15years.) does not qualify.   Lung Cancer Screening Referral: NA  Additional Screening:  Hepatitis C Screening: does qualify; Completed 04/06/2016  Vision Screening: Recommended annual ophthalmology exams for early detection of glaucoma and other disorders of the eye. Is the patient up to date with their annual eye exam?  No  Who is the provider or what is the name of the office in which the patient attends annual eye exams? [facility] If pt is not established with a provider, would they like to be referred to a provider to establish care? No .   Dental Screening: Recommended annual dental exams for proper oral hygiene  Community Resource Referral / Chronic Care Management: CRR required this visit?  No   CCM required this visit?  No      Plan:     I have personally reviewed and noted the following in the patient's chart:   Medical and social history Use of alcohol, tobacco or illicit drugs  Current medications and supplements  including opioid prescriptions.  Functional ability and status Nutritional status Physical activity Advanced directives List of other physicians Hospitalizations, surgeries, and ER visits in previous 12 months Vitals Screenings to include cognitive,  depression, and falls Referrals and appointments  In addition, I have reviewed and discussed with patient certain preventive protocols, quality metrics, and best practice recommendations. A written personalized care plan for preventive services as well as general preventive health recommendations were provided to patient.     Danella Penton, [place]   08/06/2021   Nurse Notes: The patient was at home during [place]. The provider was in the office and was Tula Nakayama, MD

## 2021-08-06 NOTE — Patient Instructions (Signed)
Amanda Harmon , Thank you for taking time to come for your Medicare Wellness Visit. I appreciate your ongoing commitment to your health goals. Please review the following plan we discussed and let me know if I can assist you in the future.   Screening recommendations/referrals: Colonoscopy: Up to date Mammogram: Up to date Bone Density: Up to date Recommended yearly ophthalmology/optometry visit for glaucoma screening and checkup Recommended yearly dental visit for hygiene and checkup  Vaccinations: Influenza vaccine: Up to date Pneumococcal vaccine: Up to date Tdap vaccine: Up to date Shingles vaccine: Completed    Advanced directives: Information discussed  Conditions/risks identified: Dyslipidemia, Osteopenia  Next appointment: 1 year   Preventive Care 34 Years and Older, Female Preventive care refers to lifestyle choices and visits with your health care provider that can promote health and wellness. What does preventive care include? A yearly physical exam. This is also called an annual well check. Dental exams once or twice a year. Routine eye exams. Ask your health care provider how often you should have your eyes checked. Personal lifestyle choices, including: Daily care of your teeth and gums. Regular physical activity. Eating a healthy diet. Avoiding tobacco and drug use. Limiting alcohol use. Practicing safe sex. Taking low-dose aspirin every day. Taking vitamin and mineral supplements as recommended by your health care provider. What happens during an annual well check? The services and screenings done by your health care provider during your annual well check will depend on your age, overall health, lifestyle risk factors, and family history of disease. Counseling  Your health care provider may ask you questions about your: Alcohol use. Tobacco use. Drug use. Emotional well-being. Home and relationship well-being. Sexual activity. Eating habits. History of  falls. Memory and ability to understand (cognition). Work and work Statistician. Reproductive health. Screening  You may have the following tests or measurements: Height, weight, and BMI. Blood pressure. Lipid and cholesterol levels. These may be checked every 5 years, or more frequently if you are over 53 years old. Skin check. Lung cancer screening. You may have this screening every year starting at age 63 if you have a 30-pack-year history of smoking and currently smoke or have quit within the past 15 years. Fecal occult blood test (FOBT) of the stool. You may have this test every year starting at age 51. Flexible sigmoidoscopy or colonoscopy. You may have a sigmoidoscopy every 5 years or a colonoscopy every 10 years starting at age 38. Hepatitis C blood test. Hepatitis B blood test. Sexually transmitted disease (STD) testing. Diabetes screening. This is done by checking your blood sugar (glucose) after you have not eaten for a while (fasting). You may have this done every 1-3 years. Bone density scan. This is done to screen for osteoporosis. You may have this done starting at age 36. Mammogram. This may be done every 1-2 years. Talk to your health care provider about how often you should have regular mammograms. Talk with your health care provider about your test results, treatment options, and if necessary, the need for more tests. Vaccines  Your health care provider may recommend certain vaccines, such as: Influenza vaccine. This is recommended every year. Tetanus, diphtheria, and acellular pertussis (Tdap, Td) vaccine. You may need a Td booster every 10 years. Zoster vaccine. You may need this after age 44. Pneumococcal 13-valent conjugate (PCV13) vaccine. One dose is recommended after age 11. Pneumococcal polysaccharide (PPSV23) vaccine. One dose is recommended after age 73. Talk to your health care provider about which  screenings and vaccines you need and how often you need  them. This information is not intended to replace advice given to you by your health care provider. Make sure you discuss any questions you have with your health care provider. Document Released: 10/11/2015 Document Revised: 06/03/2016 Document Reviewed: 07/16/2015 Elsevier Interactive Patient Education  2017 [place] Prevention in the Home Falls can cause injuries. They can happen to people of all ages. There are many things you can do to make your home safe and to help prevent falls. What can I do on the outside of my home? Regularly fix the edges of walkways and driveways and fix any cracks. Remove anything that might make you trip as you walk through a door, such as a raised step or threshold. Trim any bushes or trees on the path to your home. Use bright outdoor lighting. Clear any walking paths of anything that might make someone trip, such as rocks or tools. Regularly check to see if handrails are loose or broken. Make sure that both sides of any steps have handrails. Any raised decks and porches should have guardrails on the edges. Have any leaves, snow, or ice cleared regularly. Use sand or salt on walking paths during winter. Clean up any spills in your garage right away. This includes oil or grease spills. What can I do in the bathroom? Use night lights. Install grab bars by the toilet and in the tub and shower. Do not use towel bars as grab bars. Use non-skid mats or decals in the tub or shower. If you need to sit down in the shower, use a plastic, non-slip stool. Keep the floor dry. Clean up any water that spills on the floor as soon as it happens. Remove soap buildup in the tub or shower regularly. Attach bath mats securely with double-sided non-slip rug tape. Do not have throw rugs and other things on the floor that can make you trip. What can I do in the bedroom? Use night lights. Make sure that you have a light by your bed that is easy to reach. Do not use  any sheets or blankets that are too big for your bed. They should not hang down onto the floor. Have a firm chair that has side arms. You can use this for support while you get dressed. Do not have throw rugs and other things on the floor that can make you trip. What can I do in the kitchen? Clean up any spills right away. Avoid walking on wet floors. Keep items that you use a lot in easy-to-reach places. If you need to reach something above you, use a strong step stool that has a grab bar. Keep electrical cords out of the way. Do not use floor polish or wax that makes floors slippery. If you must use wax, use non-skid floor wax. Do not have throw rugs and other things on the floor that can make you trip. What can I do with my stairs? Do not leave any items on the stairs. Make sure that there are handrails on both sides of the stairs and use them. Fix handrails that are broken or loose. Make sure that handrails are as long as the stairways. Check any carpeting to make sure that it is firmly attached to the stairs. Fix any carpet that is loose or worn. Avoid having throw rugs at the top or bottom of the stairs. If you do have throw rugs, attach them to the floor with carpet  tape. Make sure that you have a light switch at the top of the stairs and the bottom of the stairs. If you do not have them, ask someone to add them for you. What else can I do to help prevent falls? Wear shoes that: Do not have high heels. Have rubber bottoms. Are comfortable and fit you well. Are closed at the toe. Do not wear sandals. If you use a stepladder: Make sure that it is fully opened. Do not climb a closed stepladder. Make sure that both sides of the stepladder are locked into place. Ask someone to hold it for you, if possible. Clearly mark and make sure that you can see: Any grab bars or handrails. First and last steps. Where the edge of each step is. Use tools that help you move around (mobility aids)  if they are needed. These include: Canes. Walkers. Scooters. Crutches. Turn on the lights when you go into a dark area. Replace any light bulbs as soon as they burn out. Set up your furniture so you have a clear path. Avoid moving your furniture around. If any of your floors are uneven, fix them. If there are any pets around you, be aware of where they are. Review your medicines with your doctor. Some medicines can make you feel dizzy. This can increase your chance of falling. Ask your doctor what other things that you can do to help prevent falls. This information is not intended to replace advice given to you by your health care provider. Make sure you discuss any questions you have with your health care provider. Document Released: 07/11/2009 Document Revised: 02/20/2016 Document Reviewed: 10/19/2014 Elsevier Interactive Patient Education  2017 [employer].

## 2021-08-11 DIAGNOSIS — Z23 Encounter for immunization: Secondary | ICD-10-CM | POA: Diagnosis not present

## 2021-09-12 ENCOUNTER — Ambulatory Visit (INDEPENDENT_AMBULATORY_CARE_PROVIDER_SITE_OTHER): Payer: Medicare Other | Admitting: Family Medicine

## 2021-09-12 ENCOUNTER — Other Ambulatory Visit: Payer: Self-pay

## 2021-09-12 ENCOUNTER — Encounter: Payer: Self-pay | Admitting: Family Medicine

## 2021-09-12 VITALS — BP 134/70 | HR 63 | Resp 16 | Ht 67.0 in | Wt 133.0 lb

## 2021-09-12 DIAGNOSIS — E785 Hyperlipidemia, unspecified: Secondary | ICD-10-CM | POA: Diagnosis not present

## 2021-09-12 DIAGNOSIS — M8580 Other specified disorders of bone density and structure, unspecified site: Secondary | ICD-10-CM | POA: Diagnosis not present

## 2021-09-12 DIAGNOSIS — N3090 Cystitis, unspecified without hematuria: Secondary | ICD-10-CM | POA: Diagnosis not present

## 2021-09-12 DIAGNOSIS — Z1211 Encounter for screening for malignant neoplasm of colon: Secondary | ICD-10-CM | POA: Diagnosis not present

## 2021-09-12 DIAGNOSIS — E038 Other specified hypothyroidism: Secondary | ICD-10-CM | POA: Diagnosis not present

## 2021-09-12 DIAGNOSIS — I10 Essential (primary) hypertension: Secondary | ICD-10-CM | POA: Diagnosis not present

## 2021-09-12 MED ORDER — SULFAMETHOXAZOLE-TRIMETHOPRIM 800-160 MG PO TABS: 1.0000 | ORAL_TABLET | Freq: Two times a day (BID) | ORAL | 1 refills | Status: DC | PRN

## 2021-09-12 NOTE — Patient Instructions (Addendum)
F/U in 1 year , call if you need me before  No med changes  CBC at the same time you get lab for Dr Dorris Fetch in March, 2023   Please arranger cologuard at checkout for colon screening  Great BP , keep up great health habits  Thanks for choosing [facility], we consider it a privelige to serve you.

## 2021-09-15 ENCOUNTER — Encounter: Payer: Self-pay | Admitting: Family Medicine

## 2021-09-15 DIAGNOSIS — I10 Essential (primary) hypertension: Secondary | ICD-10-CM | POA: Insufficient documentation

## 2021-09-15 NOTE — Assessment & Plan Note (Signed)
Septra prescribed for use with intercourse

## 2021-09-15 NOTE — Assessment & Plan Note (Signed)
DASH diet and commitment to daily physical activity for a minimum of 30 minutes discussed and encouraged, as a part of hypertension management. The importance of attaining a healthy weight is also discussed.  BP/Weight 09/12/2021 02/12/2021 12/23/2020 07/18/2020 04/09/2020 03/12/2020 57/04/4695  Systolic BP 295 284 132 440 102 725 366  Diastolic BP 70 90 78 85 85 82 88  Wt. (Lbs) 133 132.2 136.6 131 131.6 129 133.8  BMI 20.83 20.71 21.39 20.52 20.61 20.2 20.96     Controlled, no change in medication Managed by Cardiology

## 2021-09-15 NOTE — Progress Notes (Signed)
° °  Amanda Harmon     MRN: [id number]      DOB: 19-Nov-1945   HPI Amanda Harmon is here for follow up and re-evaluation of chronic medical conditions, medication management and review of any available recent lab and radiology data.  Preventive health is updated, specifically  Cancer screening and Immunization.   Questions or concerns regarding consultations or procedures which the PT has had in the interim are  addressed. The PT denies any adverse reactions to current medications since the last visit.  There are no new concerns.  There are no specific complaints   ROS Denies recent fever or chills. Denies sinus pressure, nasal congestion, ear pain or sore throat. Denies chest congestion, productive cough or wheezing. Denies chest pains, palpitations and leg swelling Denies abdominal pain, nausea, vomiting,diarrhea or constipation.   Denies dysuria, frequency, hesitancy or incontinence. Denies joint pain, swelling and limitation in mobility. Denies headaches, seizures, numbness, or tingling. Denies depression, anxiety or insomnia. Denies skin break down or rash.   PE  BP 134/70    Pulse 63    Resp 16    Ht 5\' 7"  (1.702 m)    Wt 133 lb (60.3 kg)    SpO2 97%    BMI 20.83 kg/m   Patient alert and oriented and in no cardiopulmonary distress.  HEENT: No facial asymmetry, EOMI,     Neck supple .  Chest: Clear to auscultation bilaterally.  CVS: S1, S2 no murmurs, no S3.Regular rate.  ABD: Soft non tender.   Ext: No edema  MS: Adequate ROM spine, shoulders, hips and knees.  Skin: Intact, no ulcerations or rash noted.  Psych: Good eye contact, normal affect. Memory intact not anxious or depressed appearing.  CNS: CN 2-12 intact, power,  normal throughout.no focal deficits noted.   Assessment & Plan  Dyslipidemia Hyperlipidemia:Low fat diet discussed and encouraged.   Lipid Panel  Lab Results  Component Value Date   CHOL 167 12/10/2020   HDL 76 12/10/2020   LDLCALC 75  12/10/2020   TRIG 90 12/10/2020   CHOLHDL 2.2 12/10/2020     Updated lab needed at/ before next visit. Managed by Endo  Recurrent cystitis Septra prescribed for use with intercourse  Essential hypertension DASH diet and commitment to daily physical activity for a minimum of 30 minutes discussed and encouraged, as a part of hypertension management. The importance of attaining a healthy weight is also discussed.  BP/Weight 09/12/2021 02/12/2021 12/23/2020 07/18/2020 04/09/2020 03/12/2020 33/10/9516  Systolic BP 841 660 630 160 109 323 557  Diastolic BP 70 90 78 85 85 82 88  Wt. (Lbs) 133 132.2 136.6 131 131.6 129 133.8  BMI 20.83 20.71 21.39 20.52 20.61 20.2 20.96     Controlled, no change in medication Managed by Cardiology  Osteopenia Treated by Endo, has h/o recurrent falls treated with bisphosphonate  Hypothyroidism Controlled, no change in medication

## 2021-09-15 NOTE — Assessment & Plan Note (Signed)
Controlled, no change in medication  

## 2021-09-15 NOTE — Assessment & Plan Note (Signed)
Hyperlipidemia:Low fat diet discussed and encouraged.   Lipid Panel  Lab Results  Component Value Date   CHOL 167 12/10/2020   HDL 76 12/10/2020   LDLCALC 75 12/10/2020   TRIG 90 12/10/2020   CHOLHDL 2.2 12/10/2020     Updated lab needed at/ before next visit. Managed by Endo

## 2021-09-15 NOTE — Assessment & Plan Note (Signed)
Treated by Endo, has h/o recurrent falls treated with bisphosphonate

## 2021-09-30 DIAGNOSIS — Z1211 Encounter for screening for malignant neoplasm of colon: Secondary | ICD-10-CM | POA: Diagnosis not present

## 2021-10-07 LAB — COLOGUARD: COLOGUARD: NEGATIVE

## 2021-11-15 ENCOUNTER — Other Ambulatory Visit: Payer: Self-pay | Admitting: "Endocrinology

## 2021-11-15 DIAGNOSIS — M810 Age-related osteoporosis without current pathological fracture: Secondary | ICD-10-CM

## 2021-11-17 NOTE — Telephone Encounter (Signed)
Last OV 12/23/2020  Next OV 12/23/2021  Last filled 03/03/2021 # 12 with 2 refills  Looks like this patient has been on Fosamax since 2016.   Is she OK to continue?

## 2021-12-03 ENCOUNTER — Encounter: Payer: Self-pay | Admitting: "Endocrinology

## 2021-12-03 DIAGNOSIS — D225 Melanocytic nevi of trunk: Secondary | ICD-10-CM | POA: Diagnosis not present

## 2021-12-04 ENCOUNTER — Other Ambulatory Visit: Payer: Self-pay | Admitting: "Endocrinology

## 2021-12-04 DIAGNOSIS — E039 Hypothyroidism, unspecified: Secondary | ICD-10-CM

## 2021-12-04 DIAGNOSIS — E559 Vitamin D deficiency, unspecified: Secondary | ICD-10-CM

## 2021-12-04 DIAGNOSIS — E782 Mixed hyperlipidemia: Secondary | ICD-10-CM

## 2021-12-16 DIAGNOSIS — E785 Hyperlipidemia, unspecified: Secondary | ICD-10-CM | POA: Diagnosis not present

## 2021-12-16 DIAGNOSIS — E039 Hypothyroidism, unspecified: Secondary | ICD-10-CM | POA: Diagnosis not present

## 2021-12-16 DIAGNOSIS — E782 Mixed hyperlipidemia: Secondary | ICD-10-CM | POA: Diagnosis not present

## 2021-12-16 DIAGNOSIS — E559 Vitamin D deficiency, unspecified: Secondary | ICD-10-CM | POA: Diagnosis not present

## 2021-12-17 LAB — CBC
Hematocrit: 43.7 % (ref 34.0–46.6)
Hemoglobin: 14.8 g/dL (ref 11.1–15.9)
MCH: 30.3 pg (ref 26.6–33.0)
MCHC: 33.9 g/dL (ref 31.5–35.7)
MCV: 90 fL (ref 79–97)
Platelets: 250 10*3/uL (ref 150–450)
RBC: 4.88 x10E6/uL (ref 3.77–5.28)
RDW: 12.7 % (ref 11.7–15.4)
WBC: 6.2 10*3/uL (ref 3.4–10.8)

## 2021-12-18 LAB — LIPID PANEL
Chol/HDL Ratio: 2.1 ratio (ref 0.0–4.4)
Cholesterol, Total: 160 mg/dL (ref 100–199)
HDL: 75 mg/dL (ref >39)
LDL Chol Calc (NIH): 74 mg/dL (ref 0–99)
Triglycerides: 56 mg/dL (ref 0–149)
VLDL Cholesterol Cal: 11 mg/dL (ref 5–40)

## 2021-12-18 LAB — COMPREHENSIVE METABOLIC PANEL
ALT: 11 IU/L (ref 0–32)
AST: 16 IU/L (ref 0–40)
Albumin/Globulin Ratio: 2.5 — ABNORMAL HIGH (ref 1.2–2.2)
Albumin: 4.3 g/dL (ref 3.7–4.7)
Alkaline Phosphatase: 74 IU/L (ref 44–121)
BUN/Creatinine Ratio: 23 (ref 12–28)
BUN: 14 mg/dL (ref 8–27)
Bilirubin Total: 0.6 mg/dL (ref 0.0–1.2)
CO2: 26 mmol/L (ref 20–29)
Calcium: 9.8 mg/dL (ref 8.7–10.3)
Chloride: 102 mmol/L (ref 96–106)
Creatinine, Ser: 0.61 mg/dL (ref 0.57–1.00)
Globulin, Total: 1.7 g/dL (ref 1.5–4.5)
Glucose: 85 mg/dL (ref 70–99)
Potassium: 3.8 mmol/L (ref 3.5–5.2)
Sodium: 142 mmol/L (ref 134–144)
Total Protein: 6 g/dL (ref 6.0–8.5)
eGFR: 93 mL/min/{1.73_m2} (ref >59)

## 2021-12-18 LAB — TSH: TSH: 1.09 u[IU]/mL (ref 0.450–4.500)

## 2021-12-18 LAB — T4, FREE: Free T4: 1.39 ng/dL (ref 0.82–1.77)

## 2021-12-18 LAB — VITAMIN D 25 HYDROXY (VIT D DEFICIENCY, FRACTURES): Vit D, 25-Hydroxy: 46.6 ng/mL (ref 30.0–100.0)

## 2021-12-18 LAB — LEAD, BLOOD (ADULT >= 16 YRS): Lead-Whole Blood: <1 ug/dL (ref 0.0–3.4)

## 2021-12-23 ENCOUNTER — Telehealth: Payer: Self-pay

## 2021-12-23 ENCOUNTER — Encounter: Payer: Self-pay | Admitting: "Endocrinology

## 2021-12-23 ENCOUNTER — Ambulatory Visit: Payer: PPO | Admitting: "Endocrinology

## 2021-12-23 ENCOUNTER — Other Ambulatory Visit: Payer: Self-pay

## 2021-12-23 VITALS — BP 132/68 | HR 64 | Ht 67.0 in | Wt 133.6 lb

## 2021-12-23 DIAGNOSIS — M722 Plantar fascial fibromatosis: Secondary | ICD-10-CM | POA: Diagnosis not present

## 2021-12-23 DIAGNOSIS — E559 Vitamin D deficiency, unspecified: Secondary | ICD-10-CM | POA: Diagnosis not present

## 2021-12-23 DIAGNOSIS — E039 Hypothyroidism, unspecified: Secondary | ICD-10-CM

## 2021-12-23 DIAGNOSIS — M810 Age-related osteoporosis without current pathological fracture: Secondary | ICD-10-CM | POA: Diagnosis not present

## 2021-12-23 DIAGNOSIS — E782 Mixed hyperlipidemia: Secondary | ICD-10-CM

## 2021-12-23 DIAGNOSIS — M2041 Other hammer toe(s) (acquired), right foot: Secondary | ICD-10-CM | POA: Diagnosis not present

## 2021-12-23 NOTE — Telephone Encounter (Signed)
Called Chuck with Cologuard  and he needs to talk with patient.  Her insurance changed on 09/28/21 and he needs to get information form her.  I have called pt and gave her the # to call. Exact Since [phone] ?

## 2021-12-23 NOTE — Progress Notes (Signed)
? ?12/23/2021 ?    ? ?Endocrinology follow-up note ? ? ?Subjective:  ? ? Patient ID: Amanda Harmon, female    DOB: Jul 06, 1946, PCP Fayrene Helper, MD ? ? ?Past Medical History:  ?Diagnosis Date  ? Allergic dermatitis due to poison oak   ? Cystitis   ? Hyperlipidemia   ? borderline   ? Mitral valve prolapse 1984  ? Pericardial effusion   ? Thyroid disease 2015  ? hypothyroidism  ? Vertigo   ? ?Past Surgical History:  ?Procedure Laterality Date  ? BACK SURGERY  2008  ? BREAST BIOPSY  1998  ? left - benign  ? CARPECTOMY HAND Right 06/22/2016  ? COLONOSCOPY N/A 01/17/2015  ? Procedure: COLONOSCOPY;  Surgeon: Rogene Houston, MD;  Location: [facility];  Service: Endoscopy;  Laterality: N/A;  830 - moved to 4/21 @ 8:30  ? EYE SURGERY Left 12/01/2013  ? cataract  ? EYE SURGERY Right 12/15/2013  ? cataract  ? IR GENERIC HISTORICAL  08/19/2016  ? IR RADIOLOGIST EVAL & MGMT 08/19/2016 [facility]  ? SPINE SURGERY    ? TONSILLECTOMY  1965  ? TUBAL LIGATION  1983  ? ?Social History  ? ?Socioeconomic History  ? Marital status: Married  ?  Spouse name: Chrissie Noa  ? Number of children: 3  ? Years of education: master's degree  ? Highest education level: Master's degree (e.g., MA, MS, MEng, MEd, MSW, MBA)  ?Occupational History  ? Occupation: Child psychotherapist  ?  Employer: RETIRED  ?Tobacco Use  ? Smoking status: Former  ?  Packs/day: 1.00  ?  Years: 4.00  ?  Pack years: 4.00  ?  Types: Cigarettes  ?  Quit date: 12/15/1968  ?  Years since quitting: 53.0  ? Smokeless tobacco: Never  ?Vaping Use  ? Vaping Use: Never used  ?Substance and Sexual Activity  ? Alcohol use: Yes  ?  Comment: socially   ? Drug use: No  ? Sexual activity: Yes  ?Other Topics Concern  ? Not on file  ?Social History Narrative  ? Not on file  ? ?Social Determinants of Health  ? ?Financial Resource Strain: Low Risk   ? Difficulty of Paying Living Expenses: Not hard at all  ?Food Insecurity: No Food Insecurity  ? Worried About Charity fundraiser  in the Last Year: Never true  ? Ran Out of Food in the Last Year: Never true  ?Transportation Needs: No Transportation Needs  ? Lack of Transportation (Medical): No  ? Lack of Transportation (Non-Medical): No  ?Physical Activity: Sufficiently Active  ? Days of Exercise per Week: 5 days  ? Minutes of Exercise per Session: 90 min  ?Stress: No Stress Concern Present  ? Feeling of Stress : Not at all  ?Social Connections: Socially Integrated  ? Frequency of Communication with Friends and Family: Three times a week  ? Frequency of Social Gatherings with Friends and Family: Three times a week  ? Attends Religious Services: More than 4 times per year  ? Active Member of Clubs or Organizations: Yes  ? Attends Archivist Meetings: More than 4 times per year  ? Marital Status: Married  ? ?Outpatient Encounter Medications as of 12/23/2021  ?Medication Sig  ? alendronate (FOSAMAX) 70 MG tablet TAKE 1 TABLET BY MOUTH WEEKLY WITH A FULL GLASS OF WATER ON AN EMPTY STOMACH  ? amLODipine (NORVASC) 2.5 MG tablet Take 1 tablet (2.5 mg total) by mouth daily.  ? Ascorbic  Acid (VITAMIN C) 1000 MG tablet Take 1,000 mg by mouth daily.  ? Calcium Carbonate-Vit D-Min (CALCIUM 1200 PO) Take 1 tablet by mouth daily.  ? ketotifen (ZADITOR) 0.025 % ophthalmic solution Place 1 drop into both eyes every morning. As needed  ? levothyroxine (SYNTHROID) 50 MCG tablet TAKE 1 TABLET BY MOUTH EVERY DAY IN THE MORNING  ? rosuvastatin (CRESTOR) 5 MG tablet Take 1 tablet (5 mg total) by mouth daily.  ? sulfamethoxazole-trimethoprim (BACTRIM DS) 800-160 MG tablet Take 1 tablet by mouth 2 (two) times daily as needed.  ? ?No facility-administered encounter medications on file as of 12/23/2021.  ? ?ALLERGIES: ?Allergies  ?Allergen Reactions  ? Aspirin Other (See Comments)  ?  Bruising  ? Poison Oak Extract [Poison Oak Extract] Rash  ? ?VACCINATION STATUS: ?Immunization History  ?Administered Date(s) Administered  ? Fluad Quad(high Dose 65+) 07/19/2020   ? Influenza Split 06/17/2014, 07/23/2017  ? Influenza, High Dose Seasonal PF 07/05/2018  ? Influenza,inj,Quad PF,6+ Mos 07/20/2013, 07/01/2015, 07/15/2016  ? Influenza-Unspecified 07/22/2021  ? Moderna Sars-Covid-2 Vaccination 11/02/2019, 12/05/2019, 07/23/2020, 04/04/2021, 08/11/2021  ? Pneumococcal Conjugate-13 10/30/2014  ? Pneumococcal Polysaccharide-23 03/23/2012  ? Tdap 12/09/2012  ? Zoster, Live 02/24/2007  ? ? ?HPI ? ?76 year old female patient with medical history as above.  She is returning for follow-up in the management of her hypothyroidism, hyperlipidemia, and osteoporosis.   ?She has no new complaints today. ?For hypothyroidism, she remains on levothyroxine 50 mcg p.o. daily before breakfast.  She continues to tolerate very well.  Her previsit thyroid function tests are consistent with appropriate replacement.   ? ?- she remains on alendronate 70 mg by mouth weekly by Dr. Moshe Cipro her primary medical doctor-approximately 6 years.  She has history of hand fracture 2 years ago.  Currently no interval problems. ? ?-Her most recent DXA scan in December 11, 2020  was c/w clinically significant improvement in the BMD of the spine, stable BMD in hip. ? ?- She has history of compression fracture of first lumbar vertebra. No recent height change. ?She denies hx of goiter. Her last menstrual period was at age 60. she has 3 grown children. ?she is a non-smoker.   ?she never took steroids on chronic basis. ?she denies cold/heat intolerance. she is active , exercises regularly. ?She was started on low-dose Crestor for dyslipidemia, her repeat previsit fasting lipid panel showed significant improvement in LDL to 74 from 126. ? ?Review of Systems ?Limited as above. ? ?Objective:  ?  ?BP 132/68   Pulse 64   Ht '5\' 7"'$  (1.702 m)   Wt 133 lb 9.6 oz (60.6 kg)   BMI 20.92 kg/m?   ?Wt Readings from Last 3 Encounters:  ?12/23/21 133 lb 9.6 oz (60.6 kg)  ?09/12/21 133 lb (60.3 kg)  ?02/12/21 132 lb 3.2 oz (60 kg)  ?   ?Physical Exam  ? ?CMP  ?   ?Component Value Date/Time  ? NA 142 12/16/2021 0807  ? K 3.8 12/16/2021 0807  ? CL 102 12/16/2021 0807  ? CO2 26 12/16/2021 0807  ? GLUCOSE 85 12/16/2021 0807  ? GLUCOSE 93 04/08/2020 0846  ? BUN 14 12/16/2021 0807  ? CREATININE 0.61 12/16/2021 0807  ? CREATININE 0.68 04/08/2020 0846  ? CALCIUM 9.8 12/16/2021 0807  ? PROT 6.0 12/16/2021 0807  ? ALBUMIN 4.3 12/16/2021 0807  ? AST 16 12/16/2021 0807  ? ALT 11 12/16/2021 0807  ? ALKPHOS 74 12/16/2021 0807  ? BILITOT 0.6 12/16/2021 0807  ? GFRNONAA 90  10/03/2019 0932  ? GFRAA 104 10/03/2019 0932  ? ?Lipid Panel  ?   ?Component Value Date/Time  ? CHOL 160 12/16/2021 0807  ? TRIG 56 12/16/2021 0807  ? HDL 75 12/16/2021 0807  ? CHOLHDL 2.1 12/16/2021 0807  ? CHOLHDL 3.3 04/08/2020 0854  ? VLDL 24 12/08/2016 0829  ? [place] 74 12/16/2021 0807  ? [place] 126 (H) 04/08/2020 0854  ? ?June 28, 2018 labs TSH 2.9 7, free T4 1.2 ? ?Recent Results (from the past 2160 hour(s))  ?Cologuard     Status: None  ? Collection Time: 09/30/21 10:30 AM  ?Result Value Ref Range  ? COLOGUARD Negative Negative  ?  Comment:  ?NEGATIVE TEST RESULT. A negative Cologuard result indicates a low likelihood that a colorectal cancer (CRC) or advanced adenoma (adenomatous polyps with more advanced pre-malignant features)  is present. The chance that a person with a negative Cologuard test has a colorectal cancer is less than 1 in 1500 (negative predictive value >99.9%) or has an  advanced adenoma is less than  5.3% (negative predictive value 94.7%). These data are based on a prospective cross-sectional study of 10,000 individuals at average risk for colorectal cancer who were screened with both Cologuard and colonoscopy. (Im[place]e T. et al, N Engl J Med 2014;370(14):1286-1297) The normal value (reference range) for this assay is negative. ? ?COLOGUARD RE-SCREENING RECOMMENDATION: Periodic colorectal cancer screening is an important part of preventive healthcare for  asymptomatic individuals at average risk for colorectal cancer.  Following a negative Cologuard result, the [place] Task Force screening guidelines recommend a Cologuard re-scree

## 2022-01-23 ENCOUNTER — Other Ambulatory Visit: Payer: Self-pay | Admitting: "Endocrinology

## 2022-03-19 ENCOUNTER — Other Ambulatory Visit: Payer: Self-pay | Admitting: "Endocrinology

## 2022-05-07 ENCOUNTER — Other Ambulatory Visit (HOSPITAL_COMMUNITY): Payer: Self-pay | Admitting: Family Medicine

## 2022-05-07 DIAGNOSIS — Z1231 Encounter for screening mammogram for malignant neoplasm of breast: Secondary | ICD-10-CM

## 2022-05-25 ENCOUNTER — Ambulatory Visit (HOSPITAL_COMMUNITY)
Admission: RE | Admit: 2022-05-25 | Discharge: 2022-05-25 | Disposition: A | Discharge status: Home / Self Care | Payer: PPO | Source: Ambulatory Visit | Attending: Family Medicine | Admitting: Family Medicine

## 2022-05-25 DIAGNOSIS — Z1231 Encounter for screening mammogram for malignant neoplasm of breast: Secondary | ICD-10-CM | POA: Diagnosis not present

## 2022-06-23 NOTE — Progress Notes (Unsigned)
Office Visit    Patient Name: Amanda Harmon Date of Encounter: 06/25/2022  PCP:  Fayrene Helper, MD   [place]  Cardiologist:  Lauree Chandler, MD  Advanced Practice Provider:  No care team member to display Electrophysiologist:  None   HPI    Amanda Harmon is a 76 y.o. female past medical history significant for borderline HLD, essential hypertension, small pericardial effusion, prior reported MVP, hypothyroidism, right carotid bruit (normal duplex 05/2019), vertigo presents today for annual follow-up appointment.  She had atypical chest pain in 2011 during a stressful situation and then other episodes of chest pain and dyspnea at rest in 2012.  Echo 02/17/2011 showed normal LV systolic function, grade 2 diastolic dysfunction.  Exercise stress test 01/2011 showed good exercise tolerance and no ischemic EKG changes.  Her EKG is reported to chronically show poor R wave progression in the precordial leads not felt to represent a prior infarct.  She was seen in the office 04/2019 with complaint of dizziness and episode of elevated heart rate.  Echocardiogram 05/2019 showed LVEF 60 to 65%, no valve disease, normal diastolic parameters, small circumferential pericardial effusion.  Carotid artery Dopplers done for right carotid bruit September 2020 showed no evidence of carotid artery disease.  She was slightly hypertensive at her last cardiology OV with planned outpatient self-monitoring of BP.  Per MyChart message she was started on Crestor 06/2020 with follow-up LDL 75 in 11/2020.  She was last seen May 2022 and was doing well at that time.  She denied any chest pain, dyspnea, palpitations, near-syncope, or syncope.  She been under a lot of stress.  Her daughter underwent a likely surgery for a benign brain tumor.  She had been noticing episodes of elevated BPs at home (majority 130s to 140s).  Today, she tells me that she has had a persistent cough that is  nonproductive.  She also struggles with rhinitis and postnasal drip which has been going on for several months.  She has been sneezing a lot as well.  She had COVID back in January 2022.  She has not had any cardiovascular symptoms such as chest pain or shortness of breath.  She did a 5 mile hike recently without any issues.  She has been taking her Crestor every other day and been compliant with all her other medicines.  Blood pressure has been well controlled.  Reports no shortness of breath nor dyspnea on exertion. Reports no chest pain, pressure, or tightness. No edema, orthopnea, PND. Reports no palpitations.    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    Cystitis    Hyperlipidemia    borderline    Mitral valve prolapse 1984   Pericardial effusion    Thyroid disease 2015   hypothyroidism   Vertigo    Past Surgical History:  Procedure Laterality Date   BACK SURGERY  2008   BREAST BIOPSY  1998   left - benign   CARPECTOMY HAND Right 06/22/2016   COLONOSCOPY N/A 01/17/2015   Procedure: COLONOSCOPY;  Surgeon: Rogene Houston, MD;  [facility];  Service: Endoscopy;  Laterality: N/A;  830 - moved to 4/21 @ 8:30   EYE SURGERY Left 12/01/2013   cataract   EYE SURGERY Right 12/15/2013   cataract   IR GENERIC HISTORICAL  08/19/2016   IR RADIOLOGIST EVAL & MGMT 08/19/2016 [facility]  [place]   TUBAL LIGATION  1983    Allergies  Allergies  Allergen Reactions   Aspirin Other (See Comments)    Bruising   Poison Oak Extract [Poison Oak Extract] Rash    EKGs/Labs/Other Studies Reviewed:   The following studies were reviewed today:  Echocardiogram 06/12/2019  IMPRESSIONS     1. The left ventricle has normal systolic function with an ejection  fraction of 60-65%. The cavity size was normal. Left ventricular diastolic  parameters were normal. No evidence of left  ventricular regional wall  motion abnormalities.   2. The right ventricle has normal systolic function. The cavity was  normal. There is no increase in right ventricular wall thickness. Right  ventricular systolic pressure is normal.   3. Small pericardial effusion.   4. The pericardial effusion is circumferential.   5. No evidence of mitral valve stenosis.   6. The aortic valve is tricuspid. No stenosis of the aortic valve.   7. The aorta is normal unless otherwise noted.   8. The aortic root, ascending aorta and aortic arch are normal in size  and structure.   SUMMARY     Normal LV systolic and diastolic function. No visualized MV  prolapse. Small circumferential pericardial effusion without  evidence of tamponade.   FINDINGS   Left Ventricle: The left ventricle has normal systolic function, with an  ejection fraction of 60-65%. The cavity size was normal. There is no  increase in left ventricular wall thickness. Left ventricular diastolic  parameters were normal. Normal left  ventricular filling pressures No evidence of left ventricular regional  wall motion abnormalities..   Right Ventricle: The right ventricle has normal systolic function. The  cavity was normal. There is no increase in right ventricular wall  thickness. Right ventricular systolic pressure is normal.   Left Atrium: Left atrial size was normal in size.   Right Atrium: Right atrial size was normal in size.   Interatrial Septum: No atrial level shunt detected by color flow Doppler.   Pericardium: A small pericardial effusion is present. The pericardial  effusion is circumferential. There is no evidence of cardiac tamponade.   Mitral Valve: The mitral valve is normal in structure. Mitral valve  regurgitation is trivial by color flow Doppler. No evidence of mitral  valve stenosis. No prolapse visualized.   Tricuspid Valve: The tricuspid valve is normal in structure. Tricuspid  valve regurgitation is  trivial by color flow Doppler.   Aortic Valve: The aortic valve is tricuspid Aortic valve regurgitation was  not visualized by color flow Doppler. There is No stenosis of the aortic  valve.   Pulmonic Valve: The pulmonic valve was not well visualized. Pulmonic valve  regurgitation is not visualized by color flow Doppler. No evidence of  pulmonic stenosis.   Aorta: The aortic root, ascending aorta and aortic arch are normal in size  and structure. The aorta is normal unless otherwise noted.   Pulmonary Artery: The pulmonary artery is not well seen.   Venous: The inferior vena cava measures 1.40 cm, is normal in size with  greater than 50% respiratory variability.   EKG:  EKG is ordered today.  The ekg ordered today demonstrates normal sinus rhythm, rate 68 bpm, no acute changes compared to previous EKG.  Recent Labs: 12/16/2021: ALT 11; BUN 14; Creatinine, Ser 0.61; Hemoglobin 14.8; Platelets 250; Potassium 3.8; Sodium 142; TSH 1.090  Recent Lipid Panel    Component Value Date/Time  CHOL 160 12/16/2021 0807   TRIG 56 12/16/2021 0807   HDL 75 12/16/2021 0807   CHOLHDL 2.1 12/16/2021 0807   CHOLHDL 3.3 04/08/2020 0854   VLDL 24 12/08/2016 0829   LDLCALC 74 12/16/2021 0807   LDLCALC 126 (H) 04/08/2020 0854    Home Medications   Current Meds  Medication Sig   alendronate (FOSAMAX) 70 MG tablet TAKE 1 TABLET BY MOUTH WEEKLY WITH A FULL GLASS OF WATER ON AN EMPTY STOMACH   Ascorbic Acid (VITAMIN C) 1000 MG tablet Take 1,000 mg by mouth daily.   Calcium Carbonate-Vit D-Min (CALCIUM 1200 PO) Take 1 tablet by mouth daily.   ketotifen (ZADITOR) 0.025 % ophthalmic solution Place 1 drop into both eyes every morning. As needed   levothyroxine (SYNTHROID) 50 MCG tablet TAKE 1 TABLET BY MOUTH EVERY DAY IN THE MORNING   sulfamethoxazole-trimethoprim (BACTRIM DS) 800-160 MG tablet Take 1 tablet by mouth 2 (two) times daily as needed.   [DISCONTINUED] rosuvastatin (CRESTOR) 5 MG tablet TAKE  1 TABLET (5 MG TOTAL) BY MOUTH DAILY.     Review of Systems      All other systems reviewed and are otherwise negative except as noted above.  Physical Exam    VS:  BP 132/80   Pulse 68   Ht '5\' 7"'$  (1.702 m)   Wt 129 lb (58.5 kg)   SpO2 95%   BMI 20.20 kg/m  , BMI Body mass index is 20.2 kg/m.  Wt Readings from Last 3 Encounters:  06/25/22 129 lb (58.5 kg)  12/23/21 133 lb 9.6 oz (60.6 kg)  09/12/21 133 lb (60.3 kg)     GEN: Well nourished, well developed, in no acute distress. HEENT: normal. Neck: Supple, no JVD, carotid bruits, or masses. Cardiac: RRR, no murmurs, rubs, or gallops. No clubbing, cyanosis, edema.  Radials/PT 2+ and equal bilaterally.  Respiratory:  Respirations regular and unlabored, clear to auscultation bilaterally. GI: Soft, nontender, nondistended. MS: No deformity or atrophy. Skin: Warm and dry, no rash. Neuro:  Strength and sensation are intact. Psych: Normal affect.  Assessment & Plan    Essential HTN -Well-controlled today 132/80 -Continue Norvasc 2.5 mg daily  Hyperlipidemia -She has been taking Crestor 5 mg every other day. -We will order a lipid panel and LFTs to further evaluate  Prior mitral valve prolapse -Not present on recent echocardiogram -Asymptomatic at this time         Disposition: Follow up 1 year with Lauree Chandler, MD or APP.  Signed, Elgie Collard, PA-C 06/25/2022, 4:33 PM [facility]

## 2022-06-25 ENCOUNTER — Ambulatory Visit: Payer: PPO | Attending: Physician Assistant | Admitting: Physician Assistant

## 2022-06-25 ENCOUNTER — Encounter: Payer: Self-pay | Admitting: Physician Assistant

## 2022-06-25 VITALS — BP 132/80 | HR 68 | Ht 67.0 in | Wt 129.0 lb

## 2022-06-25 DIAGNOSIS — Z8679 Personal history of other diseases of the circulatory system: Secondary | ICD-10-CM | POA: Diagnosis not present

## 2022-06-25 DIAGNOSIS — I10 Essential (primary) hypertension: Secondary | ICD-10-CM

## 2022-06-25 DIAGNOSIS — I3139 Other pericardial effusion (noninflammatory): Secondary | ICD-10-CM

## 2022-06-25 DIAGNOSIS — E785 Hyperlipidemia, unspecified: Secondary | ICD-10-CM | POA: Diagnosis not present

## 2022-06-25 MED ORDER — ROSUVASTATIN CALCIUM 5 MG PO TABS: 5.0000 mg | ORAL_TABLET | ORAL | 3 refills | Status: DC

## 2022-06-25 MED ORDER — AMLODIPINE BESYLATE 2.5 MG PO TABS
2.5000 mg | ORAL_TABLET | Freq: Every day | ORAL | 3 refills | Status: DC
Start: 2022-06-25 — End: 2023-07-21

## 2022-06-25 NOTE — Patient Instructions (Signed)
Medication Instructions:  1.Take rosuvastatin (Crestor) 5 mg every other day *If you need a refill on your cardiac medications before your next appointment, please call your pharmacy*   Lab Work: Fasting lipid panel in 8 weeks If you have labs (blood work) drawn today and your tests are completely normal, you will receive your results only by: [place] (if you have MyChart) OR A paper copy in the mail If you have any lab test that is abnormal or we need to change your treatment, we will call you to review the results.  Follow-Up: At [facility], you and your health needs are our priority.  As part of our continuing mission to provide you with exceptional heart care, we have created designated Provider Care Teams.  These Care Teams include your primary Cardiologist (physician) and Advanced Practice Providers (APPs -  Physician Assistants and Nurse Practitioners) who all work together to provide you with the care you need, when you need it.  Your next appointment:   1 year(s)  The format for your next appointment:   In Person  Provider:   Lauree Chandler, MD    Important Information About Sugar

## 2022-06-27 ENCOUNTER — Other Ambulatory Visit: Payer: Self-pay | Admitting: "Endocrinology

## 2022-06-29 NOTE — Telephone Encounter (Signed)
Pt is calling that she will need her thyroid medication tomorrow, if that could be called in today. Thank you

## 2022-07-20 ENCOUNTER — Encounter: Payer: Self-pay | Admitting: "Endocrinology

## 2022-07-23 ENCOUNTER — Encounter: Payer: Self-pay | Admitting: "Endocrinology

## 2022-07-23 ENCOUNTER — Ambulatory Visit (INDEPENDENT_AMBULATORY_CARE_PROVIDER_SITE_OTHER): Payer: PPO | Admitting: "Endocrinology

## 2022-07-23 VITALS — BP 142/66 | HR 60 | Ht 67.0 in | Wt 129.2 lb

## 2022-07-23 DIAGNOSIS — E039 Hypothyroidism, unspecified: Secondary | ICD-10-CM | POA: Diagnosis not present

## 2022-07-23 DIAGNOSIS — E782 Mixed hyperlipidemia: Secondary | ICD-10-CM | POA: Diagnosis not present

## 2022-07-23 DIAGNOSIS — M25559 Pain in unspecified hip: Secondary | ICD-10-CM | POA: Diagnosis not present

## 2022-07-23 DIAGNOSIS — M810 Age-related osteoporosis without current pathological fracture: Secondary | ICD-10-CM

## 2022-07-23 NOTE — Progress Notes (Signed)
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    Cystitis    Hyperlipidemia    borderline    Mitral valve prolapse 1984   Pericardial effusion    Thyroid disease 2015   hypothyroidism   Vertigo    Past Surgical History:  Procedure Laterality Date   BACK SURGERY  2008   BREAST BIOPSY  1998   left - benign   CARPECTOMY HAND Right 06/22/2016   COLONOSCOPY N/A 01/17/2015   Procedure: COLONOSCOPY;  Surgeon: Rogene Houston, MD;  [facility];  Service: Endoscopy;  Laterality: N/A;  830 - moved to 4/21 @ 8:30   EYE SURGERY Left 12/01/2013   cataract   EYE SURGERY Right 12/15/2013   cataract   IR GENERIC HISTORICAL  08/19/2016   IR RADIOLOGIST EVAL & MGMT 08/19/2016 [facility]   SPINE SURGERY     TONSILLECTOMY  1965   TUBAL LIGATION  1983   Social History   Socioeconomic History   Marital status: Married    Spouse name: Chrissie Noa   Number of children: 3   Years of education: master's degree   Highest education level: Master's degree (e.g., MA, MS, MEng, MEd, MSW, MBA)  Occupational History   Occupation: Arts development officer: RETIRED  Tobacco Use   Smoking status: Former    Packs/day: 1.00    Years: 4.00    Total pack years: 4.00    Types: Cigarettes    Quit date: 12/15/1968    Years since quitting: 53.6   Smokeless tobacco: Never  Vaping Use   Vaping Use: Never used  Substance and Sexual Activity   Alcohol use: Yes    Comment: socially    Drug use: No   Sexual activity: Yes  Other Topics Concern   Not on file  Social History Narrative   Not on file   Social Determinants of Health   Financial Resource Strain: Low Risk  (08/06/2021)   Overall Financial Resource Strain (CARDIA)    Difficulty of Paying Living  Expenses: Not hard at all  Food Insecurity: No Food Insecurity (08/06/2021)   Hunger Vital Sign    Worried About Running Out of Food in the Last Year: Never true    [place] in the Last Year: Never true  Transportation Needs: No Transportation Needs (08/06/2021)   PRAPARE - Hydrologist (Medical): No    Lack of Transportation (Non-Medical): No  Physical Activity: Sufficiently Active (08/06/2021)   Exercise Vital Sign    Days of Exercise per Week: 5 days    Minutes of Exercise per Session: 90 min  Stress: No Stress Concern Present (08/06/2021)   [place]    Feeling of Stress : Not at all  Social Connections: [place] (08/06/2021)   Social Connection and Isolation Panel [NHANES]    Frequency of Communication with Friends and Family: Three times a week    Frequency of Social Gatherings with Friends and Family: Three times a week    Attends Religious Services: More than 4 times per year    Active Member of Clubs or Organizations: Yes    Attends Archivist  Meetings: More than 4 times per year    Marital Status: Married   Outpatient Encounter Medications as of 07/23/2022  Medication Sig   alendronate (FOSAMAX) 70 MG tablet TAKE 1 TABLET BY MOUTH WEEKLY WITH A FULL GLASS OF WATER ON AN EMPTY STOMACH   amLODipine (NORVASC) 2.5 MG tablet Take 1 tablet (2.5 mg total) by mouth daily.   Ascorbic Acid (VITAMIN C) 1000 MG tablet Take 1,000 mg by mouth daily.   Calcium Carbonate-Vit D-Min (CALCIUM 1200 PO) Take 1 tablet by mouth daily.   ketotifen (ZADITOR) 0.025 % ophthalmic solution Place 1 drop into both eyes every morning. As needed   levothyroxine (SYNTHROID) 50 MCG tablet TAKE 1 TABLET BY MOUTH EVERY DAY IN THE MORNING   rosuvastatin (CRESTOR) 5 MG tablet Take 1 tablet (5 mg total) by mouth every other day.   sulfamethoxazole-trimethoprim (BACTRIM DS) 800-160 MG tablet Take  1 tablet by mouth 2 (two) times daily as needed.   No facility-administered encounter medications on file as of 07/23/2022.   ALLERGIES: Allergies  Allergen Reactions   Aspirin Other (See Comments)    Bruising   Poison Oak Extract [Poison Oak Extract] Rash   VACCINATION STATUS: Immunization History  Administered Date(s) Administered   Fluad Quad(high Dose 65+) 07/19/2020   Influenza Split 06/17/2014, 07/23/2017   Influenza, High Dose Seasonal PF 07/05/2018   Influenza,inj,Quad PF,6+ Mos 07/20/2013, 07/01/2015, 07/15/2016   Influenza-Unspecified 07/22/2021   Moderna Sars-Covid-2 Vaccination 11/02/2019, 12/05/2019, 07/23/2020, 04/04/2021, 08/11/2021   Pneumococcal Conjugate-13 10/30/2014   Pneumococcal Polysaccharide-23 03/23/2012   Tdap 12/09/2012   Zoster, Live 02/24/2007    HPI  76 year old female patient with medical history as above.  She is returning today before her scheduled appointment complaining of hip pain.  She is on alendronate for the management of osteoporosis.  She has been on alendronate for 6+ years.    Fritz Pickerel following up in his clinic for hypothyroidism, hyperlipidemia, and osteoporosis.    For hypothyroidism, she remains on levothyroxine 50 mcg p.o. daily before breakfast.  She continues to tolerate very well.  Her previsit thyroid function tests are consistent with appropriate replacement.    - she remains on alendronate 70 mg by mouth weekly by Dr. Moshe Cipro her primary medical doctor-approximately 6 years.  She has history of hand fracture 2 years ago.  Currently no interval problems.  -Her most recent DXA scan in December 11, 2020  was c/w clinically significant improvement in the BMD of the spine, stable BMD in hip.  - She has history of compression fracture of first lumbar vertebra. No recent height change. She denies hx of goiter. Her last menstrual period was at age 87. she has 3 grown children. she is a non-smoker.   she never took steroids on chronic  basis. she denies cold/heat intolerance. she is active , exercises regularly. She was started on low-dose Crestor for dyslipidemia, her repeat previsit fasting lipid panel showed significant improvement in LDL to 74 from 126.  Review of Systems Limited as above.  Objective:    BP (!) 142/66   Pulse 60   Ht '5\' 7"'$  (1.702 m)   Wt 129 lb 3.2 oz (58.6 kg)   BMI 20.24 kg/m   Wt Readings from Last 3 Encounters:  07/23/22 129 lb 3.2 oz (58.6 kg)  06/25/22 129 lb (58.5 kg)  12/23/21 133 lb 9.6 oz (60.6 kg)    Physical Exam   Exam is significant for tenderness to pressure on trochanteric line bilaterally.  No  gait deficit.  CMP     Component Value Date/Time   NA 142 12/16/2021 0807   K 3.8 12/16/2021 0807   CL 102 12/16/2021 0807   CO2 26 12/16/2021 0807   GLUCOSE 85 12/16/2021 0807   GLUCOSE 93 04/08/2020 0846   BUN 14 12/16/2021 0807   CREATININE 0.61 12/16/2021 0807   CREATININE 0.68 04/08/2020 0846   CALCIUM 9.8 12/16/2021 0807   PROT 6.0 12/16/2021 0807   ALBUMIN 4.3 12/16/2021 0807   AST 16 12/16/2021 0807   ALT 11 12/16/2021 0807   ALKPHOS 74 12/16/2021 0807   BILITOT 0.6 12/16/2021 0807   GFRNONAA 90 10/03/2019 0932   GFRAA 104 10/03/2019 0932   Lipid Panel     Component Value Date/Time   CHOL 160 12/16/2021 0807   TRIG 56 12/16/2021 0807   HDL 75 12/16/2021 0807   CHOLHDL 2.1 12/16/2021 0807   CHOLHDL 3.3 04/08/2020 0854   VLDL 24 12/08/2016 0829   LDLCALC 74 12/16/2021 0807   LDLCALC 126 (H) 04/08/2020 0854   June 28, 2018 labs TSH 2.9 7, free T4 1.2  No results found for this or any previous visit (from the past 2160 hour(s)).   Latest DEXA on December 11, 2020: Compared to her last 2 measurements as follows  AP Spine L2-L4 12/11/2020 74.3 Osteopenia -1.8 0.988 g/cm2 0.9% - AP Spine L2-L4 11/03/2018 72.2 Osteopenia -1.8 0.979 g/cm2 0.1% - AP Spine L2-L4 08/05/2016 69.9 Osteopenia -1.8 0.978 g/cm2 9.5% Yes   DualFemur Neck Right 12/11/2020 74.3  Osteopenia -1.8 0.793 g/cm2 3.3% - DualFemur Neck Right 11/03/2018 72.2 Osteopenia -1.9 0.768 g/cm2 -3.6% - DualFemur Neck Right 08/05/2016 69.9 Osteopenia -1.7 0.797 g/cm2 1.5% -   DualFemur Total Mean 12/11/2020 74.3 Osteopenia -1.6 0.808 g/cm2 -0.6% - DualFemur Total Mean 11/03/2018 72.2 Osteopenia -1.5 0.813 g/cm2 1.9% - DualFemur Total Mean 08/05/2016 69.9 Osteopenia -1.7 0.798 g/cm2 0.5% -    Assessment & Plan:   1.  Hip pain:   She is at risk of femur/hip fracture given 6+ years of treatment with alendronate. I discussed and ordered x-ray of hips/pelvis.  She will be contacted for results. She is advised to hold alendronate for now.  She will be due for bone density in March 2024.  If her results are not satisfactory, she will be switched to Prolia.  If her bone density results are satisfactory, she will be given drug holiday.  2. Hypothyroidism Her previsit thyroid function tests are consistent with appropriate replacement.  She is advised to continue levothyroxine 50 mcg p.o. daily before breakfast.     - - We discussed about the correct intake of her thyroid hormone, on empty stomach at fasting, with water, separated by at least 30 minutes from breakfast and other medications,  and separated by more than 4 hours from calcium, iron, multivitamins, acid reflux medications (PPIs). -Patient is made aware of the fact that thyroid hormone replacement is needed for life, dose to be adjusted by periodic monitoring of thyroid function tests.    3.  Osteoporosis  - she is tolerating and benefiting from  alendronate 70 mg by mouth weekly.  - Based on her most recent bone density, she continued to gain some bone mass on the spine compared to prior study,  and no change on  Dual Femur BMD analysis. -This is better than expected outcome of therapy with alendronate.   -  I advised her to discontinue alendronate for now,  See above.   She has  a propensity for falling (fell once since  last visit with no fractures), and  I advised her on precautions to avoid falling including walking on flat surface or treadmill instead of on outside pavements, and pick up  Strengthening exercises for back muscles. Carotid Doppler was negative in 2020. -She is advised to home monitor blood pressure and address it if it is greater than 140/90 on 2 separate occasions.  -She is advised to continue vitamin D3  2,000 units daily . -She will continue with calcium carbonate 1200 mg daily with lunch.  -Is her 6-year on Fosamax. -If her next  BMD response is not satisfactory, Or if she develops interim fracture, she will be considered for Prolia. We will have bone density before next visit.  4. Hyperlipidemia:  - She has family history of hyperlipidemia and coronary artery disease.  She agreed on intervention during her last visit with Crestor 5 mg p.o. nightly.  She has seen significant improvement in her LDL to 74 down from 126.   She would like to minimize her exposure to Crestor.  She is advised to take it every other day for spring and summer and resume daily intake from winter to fall and until she returns.     Side effects and precautions discussed with her.  As an alternative, whole food plant-based diet was discussed in detail and the packet of lifestyle medicine was given to her.  - I advised patient to maintain close follow up with Fayrene Helper, MD for primary care needs.    I spent 23 minutes in the care of the patient today including review of labs from Thyroid Function, CMP, and other relevant labs ; imaging/biopsy records (current and previous including abstractions from other facilities); face-to-face time discussing  her lab results and symptoms, medications doses, her options of short and long term treatment based on the latest standards of care / guidelines;   and documenting the encounter.  Amanda Harmon  participated in the discussions, expressed understanding, and  voiced agreement with the above plans.  All questions were answered to her satisfaction. she is encouraged to contact clinic should she have any questions or concerns prior to her return visit.    Follow up plan: Return we will call her with results, for Keep Reg. Appt. with Pre-visit Labs.  Glade Lloyd, MD Phone: [phone]  Fax: [phone]   07/23/2022, 7:53 PM

## 2022-07-29 ENCOUNTER — Other Ambulatory Visit: Payer: Self-pay | Admitting: "Endocrinology

## 2022-07-29 DIAGNOSIS — M810 Age-related osteoporosis without current pathological fracture: Secondary | ICD-10-CM

## 2022-08-04 ENCOUNTER — Encounter: Payer: Self-pay | Admitting: "Endocrinology

## 2022-08-05 ENCOUNTER — Ambulatory Visit (HOSPITAL_COMMUNITY)
Admission: RE | Admit: 2022-08-05 | Discharge: 2022-08-05 | Disposition: A | Discharge status: Home / Self Care | Payer: PPO | Source: Ambulatory Visit | Attending: "Endocrinology | Admitting: "Endocrinology

## 2022-08-05 DIAGNOSIS — M25551 Pain in right hip: Secondary | ICD-10-CM | POA: Diagnosis not present

## 2022-08-05 DIAGNOSIS — M25559 Pain in unspecified hip: Secondary | ICD-10-CM | POA: Insufficient documentation

## 2022-08-12 ENCOUNTER — Telehealth: Payer: Self-pay

## 2022-08-12 NOTE — Telephone Encounter (Signed)
-----   Message from Cassandria Anger, MD sent at 08/12/2022  3:31 PM EST ----- Yes , she can restart her Fosamax. ----- Message ----- From: Ellin Saba, LPN Sent: [id number]/[id number]   2:46 PM EST To: Cassandria Anger, MD  Spoke with pt and made her aware. She wants to confirm to go ahead and restart her alendronate. ----- Message ----- From: Cassandria Anger, MD Sent: 08/12/2022   2:38 PM EST To: Ellin Saba, LPN  Amanda Harmon, When you get a chance , courtesy call to [facility] to inform her that there is no hip fracture on the X-Ray.  ----- Message ----- From: Interface, Rad Results In Sent: 08/05/2022   5:57 PM EST To: Cassandria Anger, MD

## 2022-08-12 NOTE — Telephone Encounter (Signed)
Left a message requesting pt return call to the office. ?

## 2022-08-13 ENCOUNTER — Ambulatory Visit: Payer: PPO | Attending: Physician Assistant

## 2022-08-13 DIAGNOSIS — E785 Hyperlipidemia, unspecified: Secondary | ICD-10-CM | POA: Diagnosis not present

## 2022-08-13 LAB — LIPID PANEL
Chol/HDL Ratio: 2.7 ratio (ref 0.0–4.4)
Cholesterol, Total: 163 mg/dL (ref 100–199)
HDL: 61 mg/dL (ref >39)
LDL Chol Calc (NIH): 88 mg/dL (ref 0–99)
Triglycerides: 71 mg/dL (ref 0–149)
VLDL Cholesterol Cal: 14 mg/dL (ref 5–40)

## 2022-08-13 NOTE — Telephone Encounter (Signed)
Spoke with pt, advised her she could restart her alendronate weekly per Dr.Nida. Understanding voiced.

## 2022-08-13 NOTE — Telephone Encounter (Signed)
Left a message requesting pt return call to the office. ?

## 2022-08-13 NOTE — Telephone Encounter (Signed)
Pt returning your call

## 2022-08-16 ENCOUNTER — Other Ambulatory Visit: Payer: Self-pay | Admitting: "Endocrinology

## 2022-08-16 DIAGNOSIS — M810 Age-related osteoporosis without current pathological fracture: Secondary | ICD-10-CM

## 2022-09-22 ENCOUNTER — Other Ambulatory Visit: Payer: Self-pay | Admitting: "Endocrinology

## 2022-09-29 ENCOUNTER — Ambulatory Visit (INDEPENDENT_AMBULATORY_CARE_PROVIDER_SITE_OTHER): Payer: PPO | Admitting: Family Medicine

## 2022-09-29 ENCOUNTER — Telehealth: Payer: Self-pay | Admitting: Family Medicine

## 2022-09-29 ENCOUNTER — Encounter: Payer: Self-pay | Admitting: Family Medicine

## 2022-09-29 VITALS — BP 140/70 | HR 77 | Ht 67.0 in | Wt 131.1 lb

## 2022-09-29 DIAGNOSIS — E785 Hyperlipidemia, unspecified: Secondary | ICD-10-CM | POA: Diagnosis not present

## 2022-09-29 DIAGNOSIS — M8580 Other specified disorders of bone density and structure, unspecified site: Secondary | ICD-10-CM | POA: Diagnosis not present

## 2022-09-29 DIAGNOSIS — N3090 Cystitis, unspecified without hematuria: Secondary | ICD-10-CM

## 2022-09-29 DIAGNOSIS — I10 Essential (primary) hypertension: Secondary | ICD-10-CM | POA: Diagnosis not present

## 2022-09-29 DIAGNOSIS — M545 Low back pain, unspecified: Secondary | ICD-10-CM | POA: Diagnosis not present

## 2022-09-29 MED ORDER — SULFAMETHOXAZOLE-TRIMETHOPRIM 800-160 MG PO TABS
1.0000 | ORAL_TABLET | Freq: Every day | ORAL | 0 refills | Status: DC
Start: 2022-09-29 — End: 2022-12-24

## 2022-09-29 NOTE — Progress Notes (Signed)
   Amanda Harmon     MRN: [id number]      DOB: 1946/02/07   HPI Amanda Harmon is here for follow up and re-evaluation of chronic medical conditions, medication management and review of any available recent lab and radiology data.  Preventive health is updated, specifically  Cancer screening and Immunization.   Right lower back pain started 8/4, unprovoked , daily, awakens and prevents sleep , cannot directly lie on side, sometimnes discomfort inin posterior thigh to back of knee   ROS Denies recent fever or chills. Denies sinus pressure, nasal congestion, ear pain or sore throat. Denies chest congestion, productive cough or wheezing. Denies chest pains, palpitations and leg swelling Denies abdominal pain, nausea, vomiting,diarrhea or constipation.   Denies dysuria, frequency, hesitancy or incontinence.  Denies headaches, seizures, numbness, or tingling. Denies depression, anxiety or insomnia. Denies skin break down or rash.   PE  BP (!) 140/70   Pulse 77   Ht '5\' 7"'$  (1.702 m)   Wt 131 lb 1.3 oz (59.5 kg)   SpO2 97%   BMI 20.53 kg/m   Patient alert and oriented and in no cardiopulmonary distress.  HEENT: No facial asymmetry, EOMI,     Neck supple .  Chest: Clear to auscultation bilaterally.  CVS: S1, S2 no murmurs, no S3.Regular rate.  ABD: Soft non tender.   Ext: No edema  MS: decreased  ROM lumbar spine,adequate in  shoulders, hips and knees.  Skin: Intact, no ulcerations or rash noted.  Psych: Good eye contact, normal affect. Memory intact not anxious or depressed appearing.  CNS: CN 2-12 intact, power,  normal throughout.no focal deficits noted.   Assessment & Plan  Lumbar pain 4 monht h/o pain disturbing sleep, unable to find a comfortable position, radiating to right hip and leg, refer Ortho of her choice, established lumbar spine from prior imaging  Osteopenia Treated by Endo, has concerns about s/e of treatment , recently evaluated by Endo, no ed  change  Essential hypertension Elevated, followed by Cardiology as well, reports normal readings at home, has ben elevated in office in recent past Will reassess in 5 months, no med change at this visit DASH diet and commitment to daily physical activity for a minimum of 30 minutes discussed and encouraged, as a part of hypertension management. The importance of attaining a healthy weight is also discussed.     09/29/2022    2:55 PM 09/29/2022    2:14 PM 09/29/2022    2:11 PM 07/23/2022    3:05 PM 06/25/2022    2:36 PM 12/23/2021   10:59 AM 09/12/2021    2:48 PM  BP/Weight  Systolic BP 937 169 678 938 101 751 025  Diastolic BP 70 76 70 66 80 68 70  Wt. (Lbs)   131.08 129.2 129 133.6 133  BMI   20.53 kg/m2 20.24 kg/m2 20.2 kg/m2 20.92 kg/m2 20.83 kg/m2       Dyslipidemia Hyperlipidemia:Low fat diet discussed and encouraged.   Lipid Panel  Lab Results  Component Value Date   CHOL 163 08/13/2022   HDL 61 08/13/2022   LDLCALC 88 08/13/2022   TRIG 71 08/13/2022   CHOLHDL 2.7 08/13/2022     Controlled, no change in medication Updated lab needed at/ before next visit.   Cystitis Post coital , controlled with septra as needed, script sent in for as needed use

## 2022-09-29 NOTE — Patient Instructions (Signed)
Annual exam in  5 months, call if you need me sooner  Please get Fasting lipid , cmp and EGFr and CBC 3 to 5 days before visit   You are being referred to Orthopedics re back pain, pls send a message today as to office / Dr you wish to be referred to, pain that you describe is persistent, and disabling   Septra is refilled  Thanks for choosing [facility], we consider it a privelige to serve you.

## 2022-09-29 NOTE — Telephone Encounter (Signed)
Pt called stating she is wanting to be referred to [facility], Dr. Suella Broad. He is in charge of lumbar pain. States if he is no longer there then whoever else is in charge of lumbar pain will be fine.

## 2022-09-30 ENCOUNTER — Other Ambulatory Visit: Payer: Self-pay

## 2022-09-30 DIAGNOSIS — M545 Low back pain, unspecified: Secondary | ICD-10-CM

## 2022-09-30 NOTE — Telephone Encounter (Signed)
Referral placed.

## 2022-10-02 DIAGNOSIS — H35363 Drusen (degenerative) of macula, bilateral: Secondary | ICD-10-CM | POA: Diagnosis not present

## 2022-10-02 DIAGNOSIS — H43391 Other vitreous opacities, right eye: Secondary | ICD-10-CM | POA: Diagnosis not present

## 2022-10-02 DIAGNOSIS — H524 Presbyopia: Secondary | ICD-10-CM | POA: Diagnosis not present

## 2022-10-02 DIAGNOSIS — Z961 Presence of intraocular lens: Secondary | ICD-10-CM | POA: Diagnosis not present

## 2022-10-03 DIAGNOSIS — M545 Low back pain, unspecified: Secondary | ICD-10-CM | POA: Insufficient documentation

## 2022-10-03 NOTE — Assessment & Plan Note (Signed)
4 monht h/o pain disturbing sleep, unable to find a comfortable position, radiating to right hip and leg, refer Ortho of her choice, established lumbar spine from prior imaging

## 2022-10-03 NOTE — Assessment & Plan Note (Signed)
Elevated, followed by Cardiology as well, reports normal readings at home, has ben elevated in office in recent past Will reassess in 5 months, no med change at this visit DASH diet and commitment to daily physical activity for a minimum of 30 minutes discussed and encouraged, as a part of hypertension management. The importance of attaining a healthy weight is also discussed.     09/29/2022    2:55 PM 09/29/2022    2:14 PM 09/29/2022    2:11 PM 07/23/2022    3:05 PM 06/25/2022    2:36 PM 12/23/2021   10:59 AM 09/12/2021    2:48 PM  BP/Weight  Systolic BP 637 858 850 277 412 878 676  Diastolic BP 70 76 70 66 80 68 70  Wt. (Lbs)   131.08 129.2 129 133.6 133  BMI   20.53 kg/m2 20.24 kg/m2 20.2 kg/m2 20.92 kg/m2 20.83 kg/m2

## 2022-10-03 NOTE — Assessment & Plan Note (Signed)
Treated by Endo, has concerns about s/e of treatment , recently evaluated by Endo, no ed change

## 2022-10-03 NOTE — Assessment & Plan Note (Signed)
Hyperlipidemia:Low fat diet discussed and encouraged.   Lipid Panel  Lab Results  Component Value Date   CHOL 163 08/13/2022   HDL 61 08/13/2022   LDLCALC 88 08/13/2022   TRIG 71 08/13/2022   CHOLHDL 2.7 08/13/2022     Controlled, no change in medication Updated lab needed at/ before next visit.

## 2022-10-03 NOTE — Assessment & Plan Note (Signed)
Post coital , controlled with septra as needed, script sent in for as needed use

## 2022-10-12 DIAGNOSIS — M545 Low back pain, unspecified: Secondary | ICD-10-CM | POA: Diagnosis not present

## 2022-10-12 DIAGNOSIS — M5416 Radiculopathy, lumbar region: Secondary | ICD-10-CM | POA: Diagnosis not present

## 2022-10-13 ENCOUNTER — Other Ambulatory Visit: Payer: Self-pay | Admitting: "Endocrinology

## 2022-10-13 DIAGNOSIS — E039 Hypothyroidism, unspecified: Secondary | ICD-10-CM

## 2022-10-13 DIAGNOSIS — E782 Mixed hyperlipidemia: Secondary | ICD-10-CM

## 2022-10-13 DIAGNOSIS — E559 Vitamin D deficiency, unspecified: Secondary | ICD-10-CM

## 2022-12-01 DIAGNOSIS — M79672 Pain in left foot: Secondary | ICD-10-CM | POA: Diagnosis not present

## 2022-12-01 DIAGNOSIS — Q828 Other specified congenital malformations of skin: Secondary | ICD-10-CM | POA: Diagnosis not present

## 2022-12-01 DIAGNOSIS — M79671 Pain in right foot: Secondary | ICD-10-CM | POA: Diagnosis not present

## 2022-12-03 ENCOUNTER — Encounter: Payer: Self-pay | Admitting: "Endocrinology

## 2022-12-04 ENCOUNTER — Other Ambulatory Visit: Payer: Self-pay | Admitting: "Endocrinology

## 2022-12-04 DIAGNOSIS — M810 Age-related osteoporosis without current pathological fracture: Secondary | ICD-10-CM

## 2022-12-14 ENCOUNTER — Other Ambulatory Visit: Payer: Self-pay

## 2022-12-14 DIAGNOSIS — E782 Mixed hyperlipidemia: Secondary | ICD-10-CM

## 2022-12-14 DIAGNOSIS — E039 Hypothyroidism, unspecified: Secondary | ICD-10-CM

## 2022-12-14 DIAGNOSIS — E559 Vitamin D deficiency, unspecified: Secondary | ICD-10-CM

## 2022-12-14 DIAGNOSIS — M810 Age-related osteoporosis without current pathological fracture: Secondary | ICD-10-CM

## 2022-12-15 DIAGNOSIS — E039 Hypothyroidism, unspecified: Secondary | ICD-10-CM | POA: Diagnosis not present

## 2022-12-15 DIAGNOSIS — I10 Essential (primary) hypertension: Secondary | ICD-10-CM | POA: Diagnosis not present

## 2022-12-15 DIAGNOSIS — E559 Vitamin D deficiency, unspecified: Secondary | ICD-10-CM | POA: Diagnosis not present

## 2022-12-15 DIAGNOSIS — M810 Age-related osteoporosis without current pathological fracture: Secondary | ICD-10-CM | POA: Diagnosis not present

## 2022-12-15 DIAGNOSIS — E782 Mixed hyperlipidemia: Secondary | ICD-10-CM | POA: Diagnosis not present

## 2022-12-15 DIAGNOSIS — E785 Hyperlipidemia, unspecified: Secondary | ICD-10-CM | POA: Diagnosis not present

## 2022-12-16 ENCOUNTER — Ambulatory Visit (HOSPITAL_COMMUNITY)
Admission: RE | Admit: 2022-12-16 | Discharge: 2022-12-16 | Disposition: A | Discharge status: Home / Self Care | Payer: PPO | Source: Ambulatory Visit | Attending: "Endocrinology | Admitting: "Endocrinology

## 2022-12-16 DIAGNOSIS — M810 Age-related osteoporosis without current pathological fracture: Secondary | ICD-10-CM | POA: Insufficient documentation

## 2022-12-16 DIAGNOSIS — Z780 Asymptomatic menopausal state: Secondary | ICD-10-CM | POA: Diagnosis not present

## 2022-12-16 DIAGNOSIS — M8589 Other specified disorders of bone density and structure, multiple sites: Secondary | ICD-10-CM | POA: Diagnosis not present

## 2022-12-16 LAB — COMPREHENSIVE METABOLIC PANEL
ALT: 9 IU/L (ref 0–32)
AST: 14 IU/L (ref 0–40)
Albumin/Globulin Ratio: 2.6 — ABNORMAL HIGH (ref 1.2–2.2)
Albumin: 4.5 g/dL (ref 3.8–4.8)
Alkaline Phosphatase: 84 IU/L (ref 44–121)
BUN/Creatinine Ratio: 21 (ref 12–28)
BUN: 13 mg/dL (ref 8–27)
Bilirubin Total: 0.7 mg/dL (ref 0.0–1.2)
CO2: 23 mmol/L (ref 20–29)
Calcium: 9.5 mg/dL (ref 8.7–10.3)
Chloride: 104 mmol/L (ref 96–106)
Creatinine, Ser: 0.61 mg/dL (ref 0.57–1.00)
Globulin, Total: 1.7 g/dL (ref 1.5–4.5)
Glucose: 88 mg/dL (ref 70–99)
Potassium: 4 mmol/L (ref 3.5–5.2)
Sodium: 143 mmol/L (ref 134–144)
Total Protein: 6.2 g/dL (ref 6.0–8.5)
eGFR: 93 mL/min/{1.73_m2} (ref >59)

## 2022-12-16 LAB — LIPID PANEL
Chol/HDL Ratio: 2 ratio (ref 0.0–4.4)
Chol/HDL Ratio: 2.1 ratio (ref 0.0–4.4)
Cholesterol, Total: 158 mg/dL (ref 100–199)
Cholesterol, Total: 162 mg/dL (ref 100–199)
HDL: 76 mg/dL (ref >39)
HDL: 78 mg/dL (ref >39)
LDL Chol Calc (NIH): 66 mg/dL (ref 0–99)
LDL Chol Calc (NIH): 72 mg/dL (ref 0–99)
Triglycerides: 71 mg/dL (ref 0–149)
Triglycerides: 72 mg/dL (ref 0–149)
VLDL Cholesterol Cal: 14 mg/dL (ref 5–40)
VLDL Cholesterol Cal: 14 mg/dL (ref 5–40)

## 2022-12-16 LAB — CMP14+EGFR
ALT: 10 IU/L (ref 0–32)
AST: 18 IU/L (ref 0–40)
Albumin/Globulin Ratio: 2.4 — ABNORMAL HIGH (ref 1.2–2.2)
Albumin: 4.5 g/dL (ref 3.8–4.8)
Alkaline Phosphatase: 85 IU/L (ref 44–121)
BUN/Creatinine Ratio: 23 (ref 12–28)
BUN: 14 mg/dL (ref 8–27)
Bilirubin Total: 0.6 mg/dL (ref 0.0–1.2)
CO2: 24 mmol/L (ref 20–29)
Calcium: 9.3 mg/dL (ref 8.7–10.3)
Chloride: 105 mmol/L (ref 96–106)
Creatinine, Ser: 0.62 mg/dL (ref 0.57–1.00)
Globulin, Total: 1.9 g/dL (ref 1.5–4.5)
Glucose: 91 mg/dL (ref 70–99)
Potassium: 4.3 mmol/L (ref 3.5–5.2)
Sodium: 144 mmol/L (ref 134–144)
Total Protein: 6.4 g/dL (ref 6.0–8.5)
eGFR: 92 mL/min/{1.73_m2} (ref >59)

## 2022-12-16 LAB — TSH: TSH: 1.53 u[IU]/mL (ref 0.450–4.500)

## 2022-12-16 LAB — T4, FREE: Free T4: 1.47 ng/dL (ref 0.82–1.77)

## 2022-12-16 LAB — CBC
Hematocrit: 42.8 % (ref 34.0–46.6)
Hemoglobin: 14.4 g/dL (ref 11.1–15.9)
MCH: 30.3 pg (ref 26.6–33.0)
MCHC: 33.6 g/dL (ref 31.5–35.7)
MCV: 90 fL (ref 79–97)
Platelets: 235 10*3/uL (ref 150–450)
RBC: 4.75 x10E6/uL (ref 3.77–5.28)
RDW: 12.6 % (ref 11.7–15.4)
WBC: 6.1 10*3/uL (ref 3.4–10.8)

## 2022-12-16 LAB — VITAMIN D 25 HYDROXY (VIT D DEFICIENCY, FRACTURES): Vit D, 25-Hydroxy: 52.3 ng/mL (ref 30.0–100.0)

## 2022-12-18 DIAGNOSIS — M5416 Radiculopathy, lumbar region: Secondary | ICD-10-CM | POA: Diagnosis not present

## 2022-12-18 DIAGNOSIS — M545 Low back pain, unspecified: Secondary | ICD-10-CM | POA: Diagnosis not present

## 2022-12-19 ENCOUNTER — Other Ambulatory Visit: Payer: Self-pay | Admitting: "Endocrinology

## 2022-12-24 ENCOUNTER — Other Ambulatory Visit: Payer: Self-pay

## 2022-12-24 ENCOUNTER — Other Ambulatory Visit (HOSPITAL_COMMUNITY): Payer: Self-pay

## 2022-12-24 ENCOUNTER — Ambulatory Visit: Payer: PPO | Admitting: "Endocrinology

## 2022-12-24 ENCOUNTER — Encounter: Payer: Self-pay | Admitting: "Endocrinology

## 2022-12-24 ENCOUNTER — Ambulatory Visit (INDEPENDENT_AMBULATORY_CARE_PROVIDER_SITE_OTHER): Payer: PPO | Admitting: "Endocrinology

## 2022-12-24 VITALS — BP 128/68 | HR 68 | Ht 67.0 in | Wt 129.4 lb

## 2022-12-24 DIAGNOSIS — E782 Mixed hyperlipidemia: Secondary | ICD-10-CM | POA: Insufficient documentation

## 2022-12-24 DIAGNOSIS — M810 Age-related osteoporosis without current pathological fracture: Secondary | ICD-10-CM | POA: Diagnosis not present

## 2022-12-24 DIAGNOSIS — E039 Hypothyroidism, unspecified: Secondary | ICD-10-CM | POA: Diagnosis not present

## 2022-12-24 MED ORDER — PROLIA 60 MG/ML ~~LOC~~ SOSY
60.0000 mg | PREFILLED_SYRINGE | SUBCUTANEOUS | 1 refills | Status: DC
  Filled 2022-12-24 – 2022-12-28 (×2): qty 1, 180d supply, fill #0
  Filled 2023-06-22: qty 1, 180d supply, fill #1

## 2022-12-24 NOTE — Progress Notes (Signed)
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    Cystitis    Hyperlipidemia    borderline    Mitral valve prolapse 1984   Pericardial effusion    Thyroid disease 2015   hypothyroidism   Vertigo    Past Surgical History:  Procedure Laterality Date   BACK SURGERY  2008   BREAST BIOPSY  1998   left - benign   CARPECTOMY HAND Right 06/22/2016   COLONOSCOPY N/A 01/17/2015   Procedure: COLONOSCOPY;  Surgeon: Rogene Houston, MD;  Location: [facility];  Service: Endoscopy;  Laterality: N/A;  830 - moved to 4/21 @ 8:30   EYE SURGERY Left 12/01/2013   cataract   EYE SURGERY Right 12/15/2013   cataract   IR GENERIC HISTORICAL  08/19/2016   IR RADIOLOGIST EVAL & MGMT 08/19/2016 [facility]INTER[facility] RAD   SPINE SURGERY     TONSILLECTOMY  1965   TUBAL LIGATION  1983   Social History   Socioeconomic History   Marital status: Married    Spouse name: Chrissie Noa   Number of children: 3   Years of education: master's degree   Highest education level: Master's degree (e.g., MA, MS, MEng, MEd, MSW, MBA)  Occupational History   Occupation: Arts development officer: RETIRED  Tobacco Use   Smoking status: Former    Packs/day: 1.00    Years: 4.00    Additional pack years: 0.00    Total pack years: 4.00    Types: Cigarettes    Quit date: 12/15/1968    Years since quitting: 54.0   Smokeless tobacco: Never  Vaping Use   Vaping Use: Never used  Substance and Sexual Activity   Alcohol use: Yes    Comment: socially    Drug use: No   Sexual activity: Yes  Other Topics Concern   Not on file  Social History Narrative   Not on file   Social Determinants of Health   Financial Resource Strain: Low Risk  (08/06/2021)   Overall Financial Resource Strain (CARDIA)     Difficulty of Paying Living Expenses: Not hard at all  Food Insecurity: No Food Insecurity (08/06/2021)   Hunger Vital Sign    Worried About Running Out of Food in the Last Year: Never true    Ran Out of Food in the Last Year: Never true  Transportation Needs: No Transportation Needs (08/06/2021)   PRAPARE - Hydrologist (Medical): No    Lack of Transportation (Non-Medical): No  Physical Activity: Sufficiently Active (08/06/2021)   Exercise Vital Sign    Days of Exercise per Week: 5 days    Minutes of Exercise per Session: 90 min  Stress: No Stress Concern Present (08/06/2021)   [place]    Feeling of Stress : Not at all  Social Connections: [place] (08/06/2021)   Social Connection and Isolation Panel [NHANES]    Frequency of Communication with Friends and Family: Three times a week    Frequency of Social Gatherings with Friends and Family: Three times a week    Attends Religious Services: More than 4 times per year    Active Member of Clubs or Organizations: Yes  Attends Archivist Meetings: More than 4 times per year    Marital Status: Married   Outpatient Encounter Medications as of 12/24/2022  Medication Sig   alendronate (FOSAMAX) 70 MG tablet TAKE 1 TABLET BY MOUTH WEEKLY WITH A FULL GLASS OF WATER ON AN EMPTY STOMACH   amLODipine (NORVASC) 2.5 MG tablet Take 1 tablet (2.5 mg total) by mouth daily.   Ascorbic Acid (VITAMIN C) 1000 MG tablet Take 1,000 mg by mouth daily.   Calcium Carbonate-Vit D-Min (CALCIUM 1200 PO) Take 1 tablet by mouth daily.   ketotifen (ZADITOR) 0.025 % ophthalmic solution Place 1 drop into both eyes every morning. As needed   levothyroxine (SYNTHROID) 50 MCG tablet TAKE 1 TABLET BY MOUTH EVERY DAY IN THE MORNING   rosuvastatin (CRESTOR) 5 MG tablet Take 1 tablet (5 mg total) by mouth every other day.   [DISCONTINUED]  sulfamethoxazole-trimethoprim (BACTRIM DS) 800-160 MG tablet Take 1 tablet by mouth daily.   No facility-administered encounter medications on file as of 12/24/2022.   ALLERGIES: Allergies  Allergen Reactions   Aspirin Other (See Comments)    Bruising   Poison Oak Extract [Poison Oak Extract] Rash   VACCINATION STATUS: Immunization History  Administered Date(s) Administered   Fluad Quad(high Dose 65+) 07/19/2020, 08/05/2022   Influenza Split 06/17/2014, 07/23/2017   Influenza, High Dose Seasonal PF 07/05/2018, 06/12/2019   Influenza,inj,Quad PF,6+ Mos 07/20/2013, 07/01/2015, 07/15/2016   Influenza-Unspecified 07/22/2021   Moderna Covid-19 Vaccine Bivalent Booster 62yrs & up 07/03/2022   Moderna Sars-Covid-2 Vaccination 11/02/2019, 12/05/2019, 07/23/2020, 04/04/2021, 08/11/2021   Pneumococcal Conjugate-13 10/30/2014, 08/23/2017   Pneumococcal Polysaccharide-23 03/23/2012   Rsv, Bivalent, Protein Subunit Rsvpref,pf Evans Lance) 07/20/2022   Tdap 12/09/2012   Zoster Recombinat (Shingrix) 08/02/2019, 02/09/2020   Zoster, Live 02/24/2007    HPI  77 year old female patient with medical history as above.  She is returning to follow-up for osteoporosis, hypothyroidism, hyperlipidemia.  She has no interval falls/fractures.  She has no new complaints today.  She has been on alendronate for 6+ years.    For hypothyroidism, she remains on levothyroxine 50 mcg p.o. daily before breakfast.  She continues to tolerate very well.  Her previsit thyroid function tests are consistent with appropriate replacement.    - she remains on alendronate 70 mg by mouth weekly by Dr. Moshe Cipro her primary medical doctor.  Her previsit schedule bone density shows slight worsening of bone loss in hips and forearm. .  She has history of hand fracture 2 years ago.    - She has history of compression fracture of first lumbar vertebra. No recent height change. She denies hx of goiter. Her last menstrual period was at  age 42. she has 3 grown children. she is a non-smoker.   she never took steroids on chronic basis. she denies cold/heat intolerance. she is active , exercises regularly. She was started on low-dose Crestor for dyslipidemia, which she has responded with controlled LDL at 66, overall improving from 126.  Review of Systems Limited as above.  Objective:    BP 128/68   Pulse 68   Ht 5\' 7"  (1.702 m)   Wt 129 lb 6.4 oz (58.7 kg)   BMI 20.27 kg/m   Wt Readings from Last 3 Encounters:  12/24/22 129 lb 6.4 oz (58.7 kg)  09/29/22 131 lb 1.3 oz (59.5 kg)  07/23/22 129 lb 3.2 oz (58.6 kg)    Physical Exam   Exam is significant for tenderness to pressure on trochanteric line bilaterally.  No gait deficit.  CMP     Component Value Date/Time   NA 143 12/15/2022 0857   K 4.0 12/15/2022 0857   CL 104 12/15/2022 0857   CO2 23 12/15/2022 0857   GLUCOSE 88 12/15/2022 0857   GLUCOSE 93 04/08/2020 0846   BUN 13 12/15/2022 0857   CREATININE 0.61 12/15/2022 0857   CREATININE 0.68 04/08/2020 0846   CALCIUM 9.5 12/15/2022 0857   PROT 6.2 12/15/2022 0857   ALBUMIN 4.5 12/15/2022 0857   AST 14 12/15/2022 0857   ALT 9 12/15/2022 0857   ALKPHOS 84 12/15/2022 0857   BILITOT 0.7 12/15/2022 0857   GFRNONAA 90 10/03/2019 0932   GFRAA 104 10/03/2019 0932    Latest Reference Range & Units 12/15/22 08:57  TSH 0.450 - 4.500 uIU/mL 1.530  T4,Free(Direct) 0.82 - 1.77 ng/dL 1.47    Lipid Panel     Component Value Date/Time   CHOL 158 12/15/2022 0857   TRIG 71 12/15/2022 0857   HDL 78 12/15/2022 0857   CHOLHDL 2.0 12/15/2022 0857   CHOLHDL 3.3 04/08/2020 0854   VLDL 24 12/08/2016 0829   LDLCALC 66 12/15/2022 0857   LDLCALC 126 (H) 04/08/2020 0854   June 28, 2018 labs TSH 2.9 7, free T4 1.2  DualFemur Neck Right 12/16/2022 76.3 Osteopenia -2.0 0.756 g/cm2 -4.7% Yes DualFemur Neck Right 12/11/2020 74.3 Osteopenia -1.8 0.793 g/cm2 3.3% - DualFemur Neck Right 11/03/2018 72.2 Osteopenia -1.9  0.768 g/cm2 -3.6% -    DualFemur Total Mean 12/16/2022 76.3 Osteopenia -1.6 0.806 g/cm2 -0.2% - DualFemur Total Mean 12/11/2020 74.3 Osteopenia -1.6 0.808 g/cm2 -0.6% - DualFemur Total Mean 11/03/2018 72.2 Osteopenia -1.5 0.813 g/cm2 1.9% -   Left Forearm Radius 33% 12/16/2022 76.3 Osteopenia -2.1 0.560 g/cm2 - - ASSESSMENT: The BMD measured at Forearm Radius 33% is 0.560 g/cm2 with a T-score of -2.1. This patient is considered osteopenic according to [facility]) criteria. The scan quality is good. Compared with the prior study on 12/11/20, the BMD of the total mean shows no statistically significant change. Lumbar spine was excluded due to advanced degenerative changes. Patient is not a candidate for FRAX assessment due to fosamax. .     Assessment & Plan:    1. Hypothyroidism Her previsit thyroid function tests are consistent with appropriate replacement.  She is advised to continue levothyroxine 50 mcg p.o. daily before breakfast.     - We discussed about the correct intake of her thyroid hormone, on empty stomach at fasting, with water, separated by at least 30 minutes from breakfast and other medications,  and separated by more than 4 hours from calcium, iron, multivitamins, acid reflux medications (PPIs). -Patient is made aware of the fact that thyroid hormone replacement is needed for life, dose to be adjusted by periodic monitoring of thyroid function tests.   2.  Osteoporosis  - she has tolerated and benefited from alendronate 70 mg p.o. weekly for 6 years.  However, her most recent bone density is not reassuring.  It is showing worsening bone loss on hips and forearm.  She will benefit from her next option of treatment with Prolia.  She agrees with my recommendation to switch to Prolia 60 mg every 6 months subcutaneously.  She will continue on Fosamax until her insurance coverage is assured.  I discussed the cost and process of Prolia over  alendronate.  She will benefit from Prolia both from vertebral and hip fracture risk benefits. -She is advised to continue vitamin D3  2,000 units daily . -She will continue with calcium carbonate 1200 mg daily with lunch. She will be seen in 6 months for follow-up.  She will also be considered for bone density in a year.  3. Hyperlipidemia:  - She has family history of hyperlipidemia and coronary artery disease.  She is responding to low-dose Crestor with controlled LDL to 66 from 126.      She would like to minimize her exposure to Crestor.  She is advised to take it every other day for spring and summer and resume daily intake from winter to fall and until she returns.   Side effects and precautions discussed with her.  As an alternative, whole food plant-based diet was discussed in detail and the packet of lifestyle medicine was given to her.  - I advised patient to maintain close follow up with Fayrene Helper, MD for primary care needs.   I spent  25 minutes in the care of the patient today including review of labs from Thyroid Function, CMP, and other relevant labs ; imaging/biopsy records (current and previous including abstractions from other facilities); face-to-face time discussing  her lab results and symptoms, medications doses, her options of short and long term treatment based on the latest standards of care / guidelines;   and documenting the encounter.  Amanda Harmon  participated in the discussions, expressed understanding, and voiced agreement with the above plans.  All questions were answered to her satisfaction. she is encouraged to contact clinic should she have any questions or concerns prior to her return visit.    Follow up plan: Return in about 6 months (around 06/26/2023), or Prolia soon and during next visit, for Prolia Today and Prolia NV.  Glade Lloyd, MD Phone: [phone]  Fax: [phone]   12/24/2022, 7:32 PM

## 2022-12-25 ENCOUNTER — Other Ambulatory Visit (HOSPITAL_COMMUNITY): Payer: Self-pay

## 2022-12-28 ENCOUNTER — Other Ambulatory Visit (HOSPITAL_COMMUNITY): Payer: Self-pay

## 2022-12-29 ENCOUNTER — Other Ambulatory Visit: Payer: Self-pay

## 2022-12-29 ENCOUNTER — Other Ambulatory Visit (HOSPITAL_COMMUNITY): Payer: Self-pay

## 2022-12-30 ENCOUNTER — Other Ambulatory Visit: Payer: Self-pay

## 2023-01-13 ENCOUNTER — Ambulatory Visit (INDEPENDENT_AMBULATORY_CARE_PROVIDER_SITE_OTHER): Payer: PPO

## 2023-01-13 ENCOUNTER — Ambulatory Visit: Payer: PPO

## 2023-01-13 VITALS — BP 118/66 | Ht 67.0 in | Wt 131.0 lb

## 2023-01-13 DIAGNOSIS — M810 Age-related osteoporosis without current pathological fracture: Secondary | ICD-10-CM

## 2023-01-13 MED ORDER — DENOSUMAB 60 MG/ML ~~LOC~~ SOSY
60.0000 mg | PREFILLED_SYRINGE | Freq: Once | SUBCUTANEOUS | Status: AC
  Administered 2023-01-13: 60 mg via SUBCUTANEOUS

## 2023-01-13 NOTE — Progress Notes (Signed)
Pt seen for nurse visit for initial prolia injection. Pt provided prolia. Prolia  given SQ in L arm without difficulty per Dr.Nida's orders. Lot # [id number], Exp.Date 03/27/2025. Pt waited in office 15 minutes post inject without any adverse reaction.

## 2023-02-01 ENCOUNTER — Other Ambulatory Visit: Payer: Self-pay | Admitting: "Endocrinology

## 2023-02-25 ENCOUNTER — Encounter: Payer: Self-pay | Admitting: Emergency Medicine

## 2023-02-25 ENCOUNTER — Ambulatory Visit
Admission: EM | Admit: 2023-02-25 | Discharge: 2023-02-25 | Disposition: A | Discharge status: Home / Self Care | Payer: PPO | Attending: Nurse Practitioner | Admitting: Nurse Practitioner

## 2023-02-25 DIAGNOSIS — H1013 Acute atopic conjunctivitis, bilateral: Secondary | ICD-10-CM

## 2023-02-25 DIAGNOSIS — J309 Allergic rhinitis, unspecified: Secondary | ICD-10-CM | POA: Diagnosis not present

## 2023-02-25 MED ORDER — PATADAY 0.7 % OP SOLN: OPHTHALMIC | 0 refills | Status: DC

## 2023-02-25 MED ORDER — DEXAMETHASONE SODIUM PHOSPHATE 10 MG/ML IJ SOLN
10.0000 mg | INTRAMUSCULAR | Status: AC
  Administered 2023-02-25: 10 mg via INTRAMUSCULAR

## 2023-02-25 NOTE — ED Provider Notes (Addendum)
[facility]    CSN: [id number] Arrival date & time: 02/25/23  1202      History   Chief Complaint No chief complaint on file.   HPI Amanda Harmon is a 77 y.o. female.   The history is provided by the patient.   Patient presents with a 2-day history of redness, swelling, burning, and itching of her eyes.  Patient states symptoms started at the same time in both eyes.  She also complains of postnasal drip.  Patient denies fever, chills, visual changes, eye drainage, headache, nasal congestion, runny nose, sore throat, or headache.  Patient reports that she did take Benadryl with some relief.  She also reports that she tried using a cool compress to the eyes, but did not notice any change in her symptoms.  Patient reports that she does have a history of seasonal allergies.  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    Cystitis    Hyperlipidemia    borderline    Mitral valve prolapse 1984   Pericardial effusion    Thyroid disease 2015   hypothyroidism   Vertigo     Patient Active Problem List   Diagnosis Date Noted   Mixed hyperlipidemia 12/24/2022   Lumbar pain 10/03/2022   Essential hypertension 09/15/2021   Allergic rhinitis 10/21/2017   Osteopenia 08/24/2017   At high risk for injury related to fall 08/30/2016   Vitamin D deficiency 08/07/2016   Compression fracture of first lumbar vertebra (HCC) 07/15/2016   FH: CAD (coronary artery disease) 10/31/2014   Hypothyroidism 02/26/2014   Cystitis 12/07/2010   Postmenopausal osteoporosis of multiple sites 03/26/2010   Dyslipidemia 02/16/2008    Past Surgical History:  Procedure Laterality Date   BACK SURGERY  2008   BREAST BIOPSY  1998   left - benign   CARPECTOMY HAND Right 06/22/2016   COLONOSCOPY N/A 01/17/2015   Procedure: COLONOSCOPY;  Surgeon: Malissa Hippo, MD;  [facility];  Service: Endoscopy;  Laterality: N/A;  830 - moved to 4/21 @ 8:30    EYE SURGERY Left 12/01/2013   cataract   EYE SURGERY Right 12/15/2013   cataract   IR GENERIC HISTORICAL  08/19/2016   IR RADIOLOGIST EVAL & MGMT 08/19/2016 [facility]   SPINE SURGERY     TONSILLECTOMY  1965   TUBAL LIGATION  1983    OB History   No obstetric history on file.      Home Medications    Prior to Admission medications   Medication Sig Start Date End Date Taking? Authorizing Provider  Olopatadine HCl (PATADAY) 0.7 % SOLN Apply 1 drop to each eye daily. 02/25/23  Yes Yanelly Cantrelle-Warren, Sadie Haber, NP  amLODipine (NORVASC) 2.5 MG tablet Take 1 tablet (2.5 mg total) by mouth daily. 06/25/22   Sharlene Dory, PA-C  Ascorbic Acid (VITAMIN C) 1000 MG tablet Take 1,000 mg by mouth daily.    [provider]  Calcium Carbonate-Vit D-Min (CALCIUM 1200 PO) Take 1 tablet by mouth daily.    [provider]  denosumab (PROLIA) 60 MG/ML SOSY injection Inject 60 mg into the skin every 6 (six) months. 12/24/22   Roma Kayser, MD  levothyroxine (SYNTHROID) 50 MCG tablet TAKE 1 TABLET BY MOUTH EVERY DAY IN THE MORNING 02/02/23   Roma Kayser, MD  rosuvastatin (CRESTOR) 5 MG tablet Take 1 tablet (5 mg total) by mouth every other day. 02/02/23   Purcell Nails  W, MD    Family History Family History  Problem Relation Age of Onset   Heart attack Mother 72       used tobacco    Arthritis Mother        severe    Heart attack Father 7       used tobacco    Alcohol abuse Father    Cancer Brother 65       prostate    Osteoporosis Sister        severe    Osteoarthritis Sister     Social History Social History   Tobacco Use   Smoking status: Former    Packs/day: 1.00    Years: 4.00    Additional pack years: 0.00    Total pack years: 4.00    Types: Cigarettes    Quit date: 12/15/1968    Years since quitting: 54.2   Smokeless tobacco: Never  Vaping Use   Vaping Use: Never used  Substance Use Topics   Alcohol use: Yes    Comment: socially     Drug use: No     Allergies   Aspirin and Poison oak extract [poison oak extract]   Review of Systems Review of Systems Per HPI  Physical Exam Triage Vital Signs ED Triage Vitals  Enc Vitals Group     BP 02/25/23 1206 129/71     Pulse Rate 02/25/23 1206 67     Resp 02/25/23 1206 18     Temp 02/25/23 1206 98.1 F (36.7 C)     Temp Source 02/25/23 1206 Oral     SpO2 02/25/23 1206 95 %     Weight --      Height --      Head Circumference --      Peak Flow --      Pain Score 02/25/23 1208 3     Pain Loc --      Pain Edu? --      Excl. in GC? --    No data found.  Updated Vital Signs BP 129/71 (BP Location: Right Arm)   Pulse 67   Temp 98.1 F (36.7 C) (Oral)   Resp 18   SpO2 95%   Visual Acuity Right Eye Distance:   Left Eye Distance:   Bilateral Distance:    Right Eye Near:   Left Eye Near:    Bilateral Near:     Physical Exam Vitals and nursing note reviewed.  Constitutional:      General: She is not in acute distress.    Appearance: Normal appearance. She is well-developed.  HENT:     Head: Normocephalic.     Right Ear: Tympanic membrane, ear canal and external ear normal.     Left Ear: Tympanic membrane, ear canal and external ear normal.     Nose: Nose normal.     Mouth/Throat:     Mouth: Mucous membranes are moist.     Pharynx: Posterior oropharyngeal erythema present.     Comments: Cobblestoning present to posterior oropharynx Eyes:     General: Allergic shiner present.        Right eye: No foreign body, discharge or hordeolum.        Left eye: No foreign body, discharge or hordeolum.     Extraocular Movements: Extraocular movements intact.     Right eye: Normal extraocular motion and no nystagmus.     Left eye: Normal extraocular motion and no nystagmus.     Pupils: Pupils are  equal, round, and reactive to light.     Comments: Swelling and erythema noted to the periorbital region of both eyes.  Cardiovascular:     Rate and Rhythm: Normal  rate and regular rhythm.     Heart sounds: Normal heart sounds.  Pulmonary:     Effort: Pulmonary effort is normal.     Breath sounds: Normal breath sounds.  Abdominal:     General: Bowel sounds are normal. There is no distension.     Palpations: Abdomen is soft.     Tenderness: There is no abdominal tenderness. There is no guarding or rebound.  Genitourinary:    Vagina: Normal. No vaginal discharge.  Musculoskeletal:     Cervical back: Normal range of motion.  Lymphadenopathy:     Cervical: No cervical adenopathy.  Skin:    General: Skin is warm and dry.     Findings: No erythema or rash.  Neurological:     Mental Status: She is alert and oriented to person, place, and time.     Cranial Nerves: No cranial nerve deficit.  Psychiatric:        Mood and Affect: Mood normal.        Behavior: Behavior normal.      UC Treatments / Results  Labs (all labs ordered are listed, but only abnormal results are displayed) Labs Reviewed - No data to display  EKG   Radiology No results found.  Procedures Procedures (including critical care time)  Medications Ordered in UC Medications  dexamethasone (DECADRON) injection 10 mg (10 mg Intramuscular Given 02/25/23 1229)    Initial Impression / Assessment and Plan / UC Course  I have reviewed the triage vital signs and the nursing notes.  Pertinent labs & imaging results that were available during my care of the patient were reviewed by me and considered in my medical decision making (see chart for details).  The patient is well-appearing, she is in no acute distress, vital signs are stable.  Does appear to be consistent with allergic conjunctivitis.  Decadron 10 mg IM was administered for swelling and erythema around the periorbital region of both eyes.  Pataday 0.7% was prescribed symptoms.  Patient also was advised to consider starting an allergy medication daily.  Supportive care recommendations were provided and discussed with  the patient to include over-the-counter Tylenol for pain or discomfort, cool compresses to the eyes to help with swelling or pain, and avoidance of eye make-up while symptoms persist.  Patient was advised that if symptoms do not improve with this treatment to follow-up in this clinic or with her PCP for further evaluation.  Patient was in agreement with this plan of care and verbalizes understanding.  All questions were answered.  Patient stable for discharge.  Final Clinical Impressions(s) / UC Diagnoses   Final diagnoses:  Allergic conjunctivitis of both eyes     Discharge Instructions      You were given Decadron 10 mg today to help with eye irritation, redness, and swelling. Use eyedrops as prescribed.   Begin taking an allergy medication daily such as Zyrtec, Allegra, or Claritin. Cool compresses to the eyes to help with pain or swelling.  Recommend applying cool compresses at least 3 times daily for 15 to 20 minutes intervals. May use over-the-counter eyedrops such as Visine or Clear Eyes to help keep the eyes moist and lubricated. Strict handwashing when applying medication.  Avoid rubbing or manipulating the eyes while symptoms persist. If symptoms do not improve with this  treatment, please follow-up in this clinic or with your primary care physician for further evaluation. Follow-up as needed.       ED Prescriptions     Medication Sig Dispense Auth. Provider   Olopatadine HCl (PATADAY) 0.7 % SOLN Apply 1 drop to each eye daily. 5 mL Laveyah Oriol-Warren, Sadie Haber, NP      PDMP not reviewed this encounter.   Abran Cantor, NP 02/25/23 1231    Abran Cantor, NP 02/25/23 1231

## 2023-02-25 NOTE — Discharge Instructions (Addendum)
You were given Decadron 10 mg today to help with eye irritation, redness, and swelling. Use eyedrops as prescribed.   Begin taking an allergy medication daily such as Zyrtec, Allegra, or Claritin. Cool compresses to the eyes to help with pain or swelling.  Recommend applying cool compresses at least 3 times daily for 15 to 20 minutes intervals. May use over-the-counter eyedrops such as Visine or Clear Eyes to help keep the eyes moist and lubricated. Strict handwashing when applying medication.  Avoid rubbing or manipulating the eyes while symptoms persist. If symptoms do not improve with this treatment, please follow-up in this clinic or with your primary care physician for further evaluation. Follow-up as needed.

## 2023-02-25 NOTE — ED Triage Notes (Signed)
Eyes swollen, itchy, burning and red since Tuesday.  Has been taking benadryl

## 2023-03-03 ENCOUNTER — Ambulatory Visit
Admission: EM | Admit: 2023-03-03 | Discharge: 2023-03-03 | Disposition: A | Discharge status: Home / Self Care | Payer: PPO | Attending: Emergency Medicine | Admitting: Emergency Medicine

## 2023-03-03 DIAGNOSIS — H1013 Acute atopic conjunctivitis, bilateral: Secondary | ICD-10-CM

## 2023-03-03 MED ORDER — MOXIFLOXACIN HCL 0.5 % OP SOLN: 1.0000 [drp] | Freq: Three times a day (TID) | OPHTHALMIC | 0 refills | Status: DC

## 2023-03-03 MED ORDER — PREDNISONE 10 MG (21) PO TBPK: ORAL_TABLET | Freq: Every day | ORAL | 0 refills | Status: DC

## 2023-03-03 NOTE — Discharge Instructions (Signed)
You have been evaluated for your eyes, eyes appear to be related to allergic reaction due to lack of drainage however as symptoms continue to progress we will ensure that bacteria is not aiding to this  Place 1 drop of moxifloxacin into both eyes every 8 hours for the next 7 days  Begin use of prednisone every morning with food as directed to reduce inflammation which ideally will calm the swelling  You may continue use of Pataday eyedrops as needed which will ideally help with itching  Continue use of daily antihistamine Claritin which ideally will help with reactions as well as itching and swelling  You may continue use of cool compresses to help soothe the eye  Avoid direct eye touching or rubbing as this may cause further irritation  You may continue use of soap and moisturizer on the skin as long as it is unscented and unmedicated  If your symptoms continue to persist past use of prescribed medication please follow-up with your eye doctor for reevaluation and further management

## 2023-03-03 NOTE — ED Triage Notes (Signed)
Pt c/o bilateral eye redness and swelling x 1 week pt states she was seen here last Thursday and was given a steroid shot pt states she also followed instructions given and it had gotten better but started to get worse again yesterday in the evening

## 2023-03-03 NOTE — ED Provider Notes (Signed)
[facility]    CSN: [id number] Arrival date & time: 03/03/23  4098      History   Chief Complaint No chief complaint on file.   HPI Amanda Harmon is a 77 y.o. female.   Patient presents for evaluation of bilateral eye swelling, pain and pruritus present for 7 days.  Associated burning sensation to the eyes.  Denies drainage, blurred vision or light sensitivity, injury or trauma.  Was initially evaluated in this urgent care, diagnosed with allergic conjunctivitis, started on Pataday eyedrops and encouraged to continue antihistamine, no improvement seen with symptoms, was given a injection of Decadron which was helpful at the time.  Additionally has attempted use of cold compresses.   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    Cystitis    Hyperlipidemia    borderline    Mitral valve prolapse 1984   Pericardial effusion    Thyroid disease 2015   hypothyroidism   Vertigo     Patient Active Problem List   Diagnosis Date Noted   Mixed hyperlipidemia 12/24/2022   Lumbar pain 10/03/2022   Essential hypertension 09/15/2021   Allergic rhinitis 10/21/2017   Osteopenia 08/24/2017   At high risk for injury related to fall 08/30/2016   Vitamin D deficiency 08/07/2016   Compression fracture of first lumbar vertebra (HCC) 07/15/2016   FH: CAD (coronary artery disease) 10/31/2014   Hypothyroidism 02/26/2014   Cystitis 12/07/2010   Postmenopausal osteoporosis of multiple sites 03/26/2010   Dyslipidemia 02/16/2008    Past Surgical History:  Procedure Laterality Date   BACK SURGERY  2008   BREAST BIOPSY  1998   left - benign   CARPECTOMY HAND Right 06/22/2016   COLONOSCOPY N/A 01/17/2015   Procedure: COLONOSCOPY;  Surgeon: Malissa Hippo, MD;  Location: [facility];  Service: Endoscopy;  Laterality: N/A;  830 - moved to 4/21 @ 8:30   EYE SURGERY Left 12/01/2013   cataract   EYE SURGERY Right 12/15/2013   cataract   IR  GENERIC HISTORICAL  08/19/2016   IR RADIOLOGIST EVAL & MGMT 08/19/2016 [facility]   SPINE SURGERY     TONSILLECTOMY  1965   TUBAL LIGATION  1983    OB History   No obstetric history on file.      Home Medications    Prior to Admission medications   Medication Sig Start Date End Date Taking? Authorizing Provider  amLODipine (NORVASC) 2.5 MG tablet Take 1 tablet (2.5 mg total) by mouth daily. 06/25/22   Sharlene Dory, PA-C  Ascorbic Acid (VITAMIN C) 1000 MG tablet Take 1,000 mg by mouth daily.    [provider]  Calcium Carbonate-Vit D-Min (CALCIUM 1200 PO) Take 1 tablet by mouth daily.    [provider]  denosumab (PROLIA) 60 MG/ML SOSY injection Inject 60 mg into the skin every 6 (six) months. 12/24/22   Roma Kayser, MD  levothyroxine (SYNTHROID) 50 MCG tablet TAKE 1 TABLET BY MOUTH EVERY DAY IN THE MORNING 02/02/23   Roma Kayser, MD  Olopatadine HCl (PATADAY) 0.7 % SOLN Apply 1 drop to each eye daily. 02/25/23   Leath-Warren, Sadie Haber, NP  rosuvastatin (CRESTOR) 5 MG tablet Take 1 tablet (5 mg total) by mouth every other day. 02/02/23   Roma Kayser, MD    Family History Family History  Problem Relation Age of Onset   Heart attack Mother 78  used tobacco    Arthritis Mother        severe    Heart attack Father 58       used tobacco    Alcohol abuse Father    Cancer Brother 17       prostate    Osteoporosis Sister        severe    Osteoarthritis Sister     Social History Social History   Tobacco Use   Smoking status: Former    Packs/day: 1.00    Years: 4.00    Additional pack years: 0.00    Total pack years: 4.00    Types: Cigarettes    Quit date: 12/15/1968    Years since quitting: 54.2   Smokeless tobacco: Never  Vaping Use   Vaping Use: Never used  Substance Use Topics   Alcohol use: Yes    Comment: socially    Drug use: No     Allergies   Aspirin and Poison oak extract [poison oak  extract]   Review of Systems Review of Systems   Physical Exam Triage Vital Signs ED Triage Vitals  Enc Vitals Group     BP 03/03/23 0905 127/78     Pulse Rate 03/03/23 0905 83     Resp 03/03/23 0905 15     Temp 03/03/23 0905 98.4 F (36.9 C)     Temp Source 03/03/23 0905 Oral     SpO2 03/03/23 0905 98 %     Weight --      Height --      Head Circumference --      Peak Flow --      Pain Score 03/03/23 0907 6     Pain Loc --      Pain Edu? --      Excl. in GC? --    No data found.  Updated Vital Signs BP 127/78 (BP Location: Right Arm)   Pulse 83   Temp 98.4 F (36.9 C) (Oral)   Resp 15   SpO2 98%   Visual Acuity Right Eye Distance:   Left Eye Distance:   Bilateral Distance:    Right Eye Near:   Left Eye Near:    Bilateral Near:     Physical Exam Constitutional:      Appearance: Normal appearance.  Eyes:     Comments: Mild to moderate swelling and erythema surrounding the bilateral periorbital region, no erythema noted directly to the conjunctiva, vision is grossly intact, extraocular movements intact, no drainage present on exam  Pulmonary:     Effort: Pulmonary effort is normal.  Neurological:     Mental Status: She is alert and oriented to person, place, and time. Mental status is at baseline.      UC Treatments / Results  Labs (all labs ordered are listed, but only abnormal results are displayed) Labs Reviewed - No data to display  EKG   Radiology No results found.  Procedures Procedures (including critical care time)  Medications Ordered in UC Medications - No data to display  Initial Impression / Assessment and Plan / UC Course  I have reviewed the triage vital signs and the nursing notes.  Pertinent labs & imaging results that were available during my care of the patient were reviewed by me and considered in my medical decision making (see chart for details).  Allergic conjunctivitis of the bilateral eyes  Believes symptoms to  be allergic reaction, due to lack of drainage will defer use of  oral antibiotics as low suspicion of periorbital cellulitis, will provide coverage for bacterial conjunctivitis as symptoms have continued to persist, moxifloxacin prescribed and discussed administration, may continue use of Pataday eyedrops as needed to help manage pruritus, advised continued use of oral antihistamine as steroid injection was helpful will prescribe prednisone oral course for additional management, advise follow-up with her ophthalmologist if symptoms continue to persist past use of medication   Final Clinical Impressions(s) / UC Diagnoses   Final diagnoses:  None   Discharge Instructions   None    ED Prescriptions   None    PDMP not reviewed this encounter.   Valinda Hoar, NP 03/03/23 1009

## 2023-03-11 ENCOUNTER — Encounter: Payer: Self-pay | Admitting: "Endocrinology

## 2023-03-11 ENCOUNTER — Ambulatory Visit (INDEPENDENT_AMBULATORY_CARE_PROVIDER_SITE_OTHER): Payer: PPO | Admitting: Family Medicine

## 2023-03-11 ENCOUNTER — Other Ambulatory Visit: Payer: Self-pay | Admitting: "Endocrinology

## 2023-03-11 ENCOUNTER — Encounter: Payer: Self-pay | Admitting: Family Medicine

## 2023-03-11 VITALS — BP 130/70 | HR 103 | Ht 67.0 in | Wt 131.1 lb

## 2023-03-11 DIAGNOSIS — E785 Hyperlipidemia, unspecified: Secondary | ICD-10-CM

## 2023-03-11 DIAGNOSIS — R499 Unspecified voice and resonance disorder: Secondary | ICD-10-CM

## 2023-03-11 DIAGNOSIS — E559 Vitamin D deficiency, unspecified: Secondary | ICD-10-CM | POA: Diagnosis not present

## 2023-03-11 DIAGNOSIS — I10 Essential (primary) hypertension: Secondary | ICD-10-CM | POA: Diagnosis not present

## 2023-03-11 DIAGNOSIS — Z0000 Encounter for general adult medical examination without abnormal findings: Secondary | ICD-10-CM

## 2023-03-11 DIAGNOSIS — H04123 Dry eye syndrome of bilateral lacrimal glands: Secondary | ICD-10-CM | POA: Diagnosis not present

## 2023-03-11 DIAGNOSIS — Z1231 Encounter for screening mammogram for malignant neoplasm of breast: Secondary | ICD-10-CM | POA: Diagnosis not present

## 2023-03-11 MED ORDER — ROSUVASTATIN CALCIUM 5 MG PO TABS: 5.0000 mg | ORAL_TABLET | Freq: Every evening | ORAL | 1 refills | Status: DC

## 2023-03-11 NOTE — Patient Instructions (Addendum)
F/U in mid January, call if you need me sooner  Please schedule mammogram at checkout  You do need TdAP it is due  Please plan to get Covid vaccine in the Fall and also the flu vaccine  Pls have crestor script changed to reflect once daily , since that is how you are taking it  You are being referred to ENT to evaluate voice change   Good that  bone density has improved  Careful not to fall  Thanks for choosing [facility], we consider it a privelige to serve you. Safe and fun Summer and  Fall, cONGTATS on 55 plus years!, continue to  ENJOY!  Fasting lipid, cmp and EgFR, CBC and vit D 1 week before January appointment

## 2023-03-19 ENCOUNTER — Encounter: Payer: Self-pay | Admitting: Family Medicine

## 2023-03-19 DIAGNOSIS — R499 Unspecified voice and resonance disorder: Secondary | ICD-10-CM | POA: Insufficient documentation

## 2023-03-19 NOTE — Progress Notes (Signed)
    Amanda Harmon     MRN: [id number]      DOB: 1946/02/04  Chief Complaint  Patient presents with   Annual Exam    CPE eye allergy seen eye doctor this morning    HPI: Patient is in for annual physical exam. Has had recurrent infra orbital swelling and redness of eyes, has seen urgent care and eye specialist, responds to topical sterid then recurrs, new, no change in vision Back pai resolved after lifting/ moving spouse who has beenimobile due to fracture for past several months C/o change in and loss of voice ongoing, over time, no report uncontrolled post nasal drainage or reflux symptoms, remote h/o nicotine Recent labs,  are reviewed. Immunization is reviewed , and  needs TdAP.   PE: BP 130/70   Pulse (!) 103   Ht 5\' 7"  (1.702 m)   Wt 131 lb 1.3 oz (59.5 kg)   SpO2 96%   BMI 20.53 kg/m   Pleasant  female, alert and oriented x 3, in no cardio-pulmonary distress. Afebrile. HEENT No facial trauma or asymetry. Sinuses non tender.  Extra occullar muscles intact..Mild erythm and swelling periorbitally, right worse than left External ears normal, . Neck: supple, no adenopathy,JVD or thyromegaly.No bruits.  Chest: Clear to ascultation bilaterally.No crackles or wheezes. Non tender to palpation  Cardiovascular system; Heart sounds normal,  S1 and  S2 ,no S3.  No murmur, or thrill. Apical beat not displaced Peripheral pulses normal.  Abdomen: Soft, non tender, no organomegaly or masses. No bruits. Bowel sounds normal. No guarding, tenderness or rebound.   Musculoskeletal exam: Full ROM of spine, hips , shoulders and knees. No deformity ,swelling or crepitus noted. No muscle wasting or atrophy.   Neurologic: Cranial nerves 2 to 12 intact. Power, tone ,sensation and reflexes normal throughout. No disturbance in gait. No tremor.  Skin: Intact, no ulceration, erythema , scaling or rash noted. Pigmentation normal throughout  Psych; Normal mood and affect.  Judgement and concentration normal   Assessment & Plan:  Annual physical exam Annual exam as documented. Counseling done  re healthy lifestyle involving commitment to 150 minutes exercise per week, heart healthy diet, and attaining healthy weight.The importance of adequate sleep also discussed. Regular seat belt use and home safety, is also discussed. Changes in health habits are decided on by the patient with goals and time frames  set for achieving them. Immunization and cancer screening needs are specifically addressed at this visit.   Change in voice Progressive change in vouice/ hoarseness, ENT to eval

## 2023-03-19 NOTE — Assessment & Plan Note (Signed)
Progressive change in vouice/ hoarseness, ENT to eval

## 2023-03-19 NOTE — Assessment & Plan Note (Signed)

## 2023-04-14 ENCOUNTER — Encounter: Payer: Self-pay | Admitting: Family Medicine

## 2023-04-26 ENCOUNTER — Encounter: Payer: Self-pay | Admitting: Family Medicine

## 2023-04-26 ENCOUNTER — Other Ambulatory Visit: Payer: Self-pay | Admitting: Family Medicine

## 2023-04-26 DIAGNOSIS — L720 Epidermal cyst: Secondary | ICD-10-CM

## 2023-04-27 NOTE — Telephone Encounter (Signed)
It's ok sorry It took me a while to find someone who would do laser ablation

## 2023-05-07 DIAGNOSIS — R490 Dysphonia: Secondary | ICD-10-CM | POA: Diagnosis not present

## 2023-05-07 DIAGNOSIS — K219 Gastro-esophageal reflux disease without esophagitis: Secondary | ICD-10-CM | POA: Diagnosis not present

## 2023-05-07 DIAGNOSIS — H903 Sensorineural hearing loss, bilateral: Secondary | ICD-10-CM | POA: Diagnosis not present

## 2023-05-28 ENCOUNTER — Encounter (HOSPITAL_COMMUNITY): Payer: Self-pay

## 2023-05-28 ENCOUNTER — Ambulatory Visit (HOSPITAL_COMMUNITY)
Admission: RE | Admit: 2023-05-28 | Discharge: 2023-05-28 | Disposition: A | Discharge status: Home / Self Care | Payer: PPO | Source: Ambulatory Visit | Attending: Family Medicine | Admitting: Family Medicine

## 2023-05-28 DIAGNOSIS — Z1231 Encounter for screening mammogram for malignant neoplasm of breast: Secondary | ICD-10-CM | POA: Insufficient documentation

## 2023-06-07 ENCOUNTER — Other Ambulatory Visit (HOSPITAL_COMMUNITY): Payer: Self-pay

## 2023-06-09 ENCOUNTER — Other Ambulatory Visit (HOSPITAL_COMMUNITY): Payer: Self-pay

## 2023-06-10 ENCOUNTER — Encounter: Payer: Self-pay | Admitting: "Endocrinology

## 2023-06-17 ENCOUNTER — Other Ambulatory Visit: Payer: Self-pay | Admitting: "Endocrinology

## 2023-06-17 ENCOUNTER — Encounter: Payer: Self-pay | Admitting: Family Medicine

## 2023-06-19 ENCOUNTER — Encounter (HOSPITAL_COMMUNITY): Payer: Self-pay

## 2023-06-22 ENCOUNTER — Other Ambulatory Visit (HOSPITAL_COMMUNITY): Payer: Self-pay

## 2023-06-22 ENCOUNTER — Other Ambulatory Visit: Payer: Self-pay

## 2023-06-22 NOTE — Progress Notes (Signed)
Specialty Pharmacy Refill Coordination Note  Amanda Harmon is a 77 y.o. female contacted today regarding refills of specialty medication(s) Denosumab .  Patient requested Courier to Provider Office  on 06/24/23  to verified address [facility] [place], [place] [address]   Medication will be filled on 06/23/23.

## 2023-06-23 DIAGNOSIS — E559 Vitamin D deficiency, unspecified: Secondary | ICD-10-CM | POA: Diagnosis not present

## 2023-06-23 DIAGNOSIS — I10 Essential (primary) hypertension: Secondary | ICD-10-CM | POA: Diagnosis not present

## 2023-06-23 DIAGNOSIS — M810 Age-related osteoporosis without current pathological fracture: Secondary | ICD-10-CM | POA: Diagnosis not present

## 2023-06-23 DIAGNOSIS — E039 Hypothyroidism, unspecified: Secondary | ICD-10-CM | POA: Diagnosis not present

## 2023-06-23 DIAGNOSIS — E785 Hyperlipidemia, unspecified: Secondary | ICD-10-CM | POA: Diagnosis not present

## 2023-06-24 LAB — CBC
Hematocrit: 45.8 % (ref 34.0–46.6)
Hemoglobin: 14.7 g/dL (ref 11.1–15.9)
MCH: 30.1 pg (ref 26.6–33.0)
MCHC: 32.1 g/dL (ref 31.5–35.7)
MCV: 94 fL (ref 79–97)
Platelets: 271 10*3/uL (ref 150–450)
RBC: 4.88 x10E6/uL (ref 3.77–5.28)
RDW: 12.6 % (ref 11.7–15.4)
WBC: 5.3 10*3/uL (ref 3.4–10.8)

## 2023-06-24 LAB — COMPREHENSIVE METABOLIC PANEL
ALT: 9 IU/L (ref 0–32)
AST: 17 IU/L (ref 0–40)
Albumin: 4.4 g/dL (ref 3.8–4.8)
Alkaline Phosphatase: 70 IU/L (ref 44–121)
BUN/Creatinine Ratio: 23 (ref 12–28)
BUN: 13 mg/dL (ref 8–27)
Bilirubin Total: 0.4 mg/dL (ref 0.0–1.2)
CO2: 28 mmol/L (ref 20–29)
Calcium: 9.4 mg/dL (ref 8.7–10.3)
Chloride: 106 mmol/L (ref 96–106)
Creatinine, Ser: 0.56 mg/dL — ABNORMAL LOW (ref 0.57–1.00)
Globulin, Total: 1.9 g/dL (ref 1.5–4.5)
Glucose: 87 mg/dL (ref 70–99)
Potassium: 4.2 mmol/L (ref 3.5–5.2)
Sodium: 146 mmol/L — ABNORMAL HIGH (ref 134–144)
Total Protein: 6.3 g/dL (ref 6.0–8.5)
eGFR: 95 mL/min/{1.73_m2} (ref >59)

## 2023-06-24 LAB — CMP14+EGFR
ALT: 8 IU/L (ref 0–32)
AST: 16 IU/L (ref 0–40)
Albumin: 4.4 g/dL (ref 3.8–4.8)
Alkaline Phosphatase: 72 IU/L (ref 44–121)
BUN/Creatinine Ratio: 22 (ref 12–28)
BUN: 14 mg/dL (ref 8–27)
Bilirubin Total: 0.4 mg/dL (ref 0.0–1.2)
CO2: 26 mmol/L (ref 20–29)
Calcium: 9.2 mg/dL (ref 8.7–10.3)
Chloride: 106 mmol/L (ref 96–106)
Creatinine, Ser: 0.64 mg/dL (ref 0.57–1.00)
Globulin, Total: 1.9 g/dL (ref 1.5–4.5)
Glucose: 85 mg/dL (ref 70–99)
Potassium: 4 mmol/L (ref 3.5–5.2)
Sodium: 147 mmol/L — ABNORMAL HIGH (ref 134–144)
Total Protein: 6.3 g/dL (ref 6.0–8.5)
eGFR: 92 mL/min/{1.73_m2} (ref >59)

## 2023-06-24 LAB — LIPID PANEL
Chol/HDL Ratio: 2.6 ratio (ref 0.0–4.4)
Cholesterol, Total: 177 mg/dL (ref 100–199)
HDL: 68 mg/dL (ref >39)
LDL Chol Calc (NIH): 96 mg/dL (ref 0–99)
Triglycerides: 71 mg/dL (ref 0–149)
VLDL Cholesterol Cal: 13 mg/dL (ref 5–40)

## 2023-06-24 LAB — TSH: TSH: 2.17 u[IU]/mL (ref 0.450–4.500)

## 2023-06-24 LAB — T4, FREE: Free T4: 1.52 ng/dL (ref 0.82–1.77)

## 2023-06-24 LAB — VITAMIN D 25 HYDROXY (VIT D DEFICIENCY, FRACTURES): Vit D, 25-Hydroxy: 37.8 ng/mL (ref 30.0–100.0)

## 2023-06-28 ENCOUNTER — Ambulatory Visit: Payer: PPO | Admitting: "Endocrinology

## 2023-06-28 ENCOUNTER — Encounter: Payer: Self-pay | Admitting: "Endocrinology

## 2023-06-28 VITALS — BP 136/68 | HR 68 | Ht 67.0 in | Wt 131.6 lb

## 2023-06-28 DIAGNOSIS — E039 Hypothyroidism, unspecified: Secondary | ICD-10-CM | POA: Diagnosis not present

## 2023-06-28 DIAGNOSIS — E559 Vitamin D deficiency, unspecified: Secondary | ICD-10-CM

## 2023-06-28 DIAGNOSIS — E782 Mixed hyperlipidemia: Secondary | ICD-10-CM | POA: Diagnosis not present

## 2023-06-28 DIAGNOSIS — M810 Age-related osteoporosis without current pathological fracture: Secondary | ICD-10-CM | POA: Diagnosis not present

## 2023-06-28 MED ORDER — LEVOTHYROXINE SODIUM 50 MCG PO TABS: ORAL_TABLET | ORAL | 1 refills | Status: DC

## 2023-06-28 MED ORDER — ROSUVASTATIN CALCIUM 10 MG PO TABS: 10.0000 mg | ORAL_TABLET | Freq: Every evening | ORAL | 1 refills | Status: DC

## 2023-06-28 MED ORDER — DENOSUMAB 60 MG/ML ~~LOC~~ SOSY
60.0000 mg | PREFILLED_SYRINGE | Freq: Once | SUBCUTANEOUS | Status: AC
Start: 2023-06-28 — End: 2023-06-28
  Administered 2023-06-28: 60 mg via SUBCUTANEOUS

## 2023-06-28 NOTE — Progress Notes (Signed)
Pt seen for follow up visit with Dr.Nida and prolia injection. Pt provided prolia. Lot # [id number], Exp.Date 08/27/2025. Prolia 60mg  give SQ in lower L abdomen without difficulty. Pt waited 15 minutes post injection without any reactions noted.

## 2023-06-28 NOTE — Progress Notes (Signed)
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    Cystitis    Hyperlipidemia    borderline    Mitral valve prolapse 1984   Pericardial effusion    Thyroid disease 2015   hypothyroidism   Vertigo    Past Surgical History:  Procedure Laterality Date   BACK SURGERY  2008   BREAST BIOPSY  1998   left - benign   CARPECTOMY HAND Right 06/22/2016   COLONOSCOPY N/A 01/17/2015   Procedure: COLONOSCOPY;  Surgeon: Malissa Hippo, MD;  Location: [facility];  Service: Endoscopy;  Laterality: N/A;  830 - moved to 4/21 @ 8:30   EYE SURGERY Left 12/01/2013   cataract   EYE SURGERY Right 12/15/2013   cataract   IR GENERIC HISTORICAL  08/19/2016   IR RADIOLOGIST EVAL & MGMT 08/19/2016 [facility]   SPINE SURGERY     TONSILLECTOMY  1965   TUBAL LIGATION  1983   Social History   Socioeconomic History   Marital status: Married    Spouse name: Iantha Fallen   Number of children: 3   Years of education: master's degree   Highest education level: Master's degree (e.g., MA, MS, MEng, MEd, MSW, MBA)  Occupational History   Occupation: Metallurgist: RETIRED  Tobacco Use   Smoking status: Former    Current packs/day: 0.00    Average packs/day: 1 pack/day for 4.0 years (4.0 ttl pk-yrs)    Types: Cigarettes    Start date: 12/15/1964    Quit date: 12/15/1968    Years since quitting: 54.5   Smokeless tobacco: Never  Vaping Use   Vaping status: Never Used  Substance and Sexual Activity   Alcohol use: Yes    Comment: socially    Drug use: No   Sexual activity: Yes  Other Topics Concern   Not on file  Social History Narrative   Not on file   Social Determinants of Health   Financial Resource Strain: Low Risk  (08/06/2021)   Overall Financial  Resource Strain (CARDIA)    Difficulty of Paying Living Expenses: Not hard at all  Food Insecurity: No Food Insecurity (08/06/2021)   Hunger Vital Sign    Worried About Running Out of Food in the Last Year: Never true    Ran Out of Food in the Last Year: Never true  Transportation Needs: No Transportation Needs (08/06/2021)   PRAPARE - Administrator, Civil Service (Medical): No    Lack of Transportation (Non-Medical): No  Physical Activity: Sufficiently Active (08/06/2021)   Exercise Vital Sign    Days of Exercise per Week: 5 days    Minutes of Exercise per Session: 90 min  Stress: No Stress Concern Present (08/06/2021)   [employer] of Occupational Health - Occupational Stress Questionnaire    Feeling of Stress : Not at all  Social Connections: Socially Integrated (08/06/2021)   Social Connection and Isolation Panel [NHANES]    Frequency of Communication with Friends and Family: Three times a week    Frequency of Social Gatherings with Friends and Family: Three times a week    Attends Religious Services: More than 4 times per year    Active Member of Clubs or Organizations:  Yes    Attends Club or Organization Meetings: More than 4 times per year    Marital Status: Married   Outpatient Encounter Medications as of 06/28/2023  Medication Sig   loratadine (CLARITIN) 10 MG tablet Take 10 mg by mouth daily.   omeprazole (PRILOSEC) 40 MG capsule Take 40 mg by mouth at bedtime.   amLODipine (NORVASC) 2.5 MG tablet Take 1 tablet (2.5 mg total) by mouth daily.   Ascorbic Acid (VITAMIN C) 1000 MG tablet Take 1,000 mg by mouth daily.   Ascorbic Acid Buffered (BUFFERED VITAMIN C) 1000 MG CAPS 1 capsule every day by oral route.   Calcium Carbonate-Vit D-Min (CALCIUM 1200 PO) Take 1 tablet by mouth daily.   denosumab (PROLIA) 60 MG/ML SOSY injection Inject 60 mg into the skin every 6 (six) months.   levothyroxine (SYNTHROID) 50 MCG tablet TAKE 1 TABLET BY MOUTH EVERY DAY IN THE  MORNING   neomycin-polymyxin-dexameth (MAXITROL) 0.1 % OINT 1 Application.   rosuvastatin (CRESTOR) 10 MG tablet Take 1 tablet (10 mg total) by mouth at bedtime.   [DISCONTINUED] levothyroxine (SYNTHROID) 50 MCG tablet TAKE 1 TABLET BY MOUTH EVERY DAY IN THE MORNING   [DISCONTINUED] Olopatadine HCl (PATADAY) 0.7 % SOLN Apply 1 drop to each eye daily.   [DISCONTINUED] rosuvastatin (CRESTOR) 5 MG tablet Take 1 tablet (5 mg total) by mouth at bedtime.   [EXPIRED] denosumab (PROLIA) injection 60 mg    No facility-administered encounter medications on file as of 06/28/2023.   ALLERGIES: Allergies  Allergen Reactions   Aspirin Other (See Comments)    Bruising   Poison Oak Extract [Poison Oak Extract] Rash   VACCINATION STATUS: Immunization History  Administered Date(s) Administered   Fluad Quad(high Dose 65+) 07/19/2020, 08/05/2022   Influenza Split 06/17/2014, 07/23/2017   Influenza, High Dose Seasonal PF 07/05/2018, 06/12/2019   Influenza,inj,Quad PF,6+ Mos 07/20/2013, 07/01/2015, 07/15/2016   Influenza-Unspecified 07/22/2021   Moderna Covid-19 Vaccine Bivalent Booster 91yrs & up 07/03/2022, 06/14/2023   Moderna Sars-Covid-2 Vaccination 11/02/2019, 12/05/2019, 07/23/2020, 04/04/2021, 08/11/2021   Pneumococcal Conjugate-13 10/30/2014, 08/23/2017   Pneumococcal Polysaccharide-23 03/23/2012   Rsv, Bivalent, Protein Subunit Rsvpref,pf Verdis Frederickson) 07/20/2022   Tdap 12/09/2012, 04/05/2023   Zoster Recombinant(Shingrix) 08/02/2019, 02/09/2020   Zoster, Live 02/24/2007    HPI  77 year old female patient with medical history as above.  She is returning to follow-up for osteoporosis, hypothyroidism, hyperlipidemia.  She has no interval falls/fractures.  She has no new complaints today.  She has been on alendronate for 6+ years.    For hypothyroidism, she remains on levothyroxine 50 mcg p.o. daily before breakfast.  She continues to tolerate very well.  Her previsit thyroid function tests are  consistent with appropriate replacement.  She also has postmenopausal osteoporosis for which she has received treatment with alendronate 70 mg p.o. weekly for 6 years.  Due to insufficient response, she was switched to Prolia during her last visit.  She tolerated her first dose, ready for her next dose today.  She has no interval falls nor fractures.  Appropriate replacement.    .  She has history of hand fracture 2 years ago.    - She has history of compression fracture of first lumbar vertebra. No recent height change. She denies hx of goiter. Her last menstrual period was at age 20. she has 3 grown children. she is a non-smoker.   she never took steroids on chronic basis. she denies cold/heat intolerance. she is active , exercises regularly. She was started on low-dose  Crestor for dyslipidemia, which she has responded with controlled LDL at 66, overall improving from 126.  Review of Systems Limited as above.  Objective:    BP 136/68   Pulse 68   Ht 5\' 7"  (1.702 m)   Wt 131 lb 9.6 oz (59.7 kg)   BMI 20.61 kg/m   Wt Readings from Last 3 Encounters:  06/28/23 131 lb 9.6 oz (59.7 kg)  03/11/23 131 lb 1.3 oz (59.5 kg)  01/13/23 131 lb (59.4 kg)    Physical Exam   Exam is significant for tenderness to pressure on trochanteric line bilaterally.  No gait deficit.  CMP     Component Value Date/Time   NA 146 (H) 06/23/2023 0806   K 4.2 06/23/2023 0806   CL 106 06/23/2023 0806   CO2 28 06/23/2023 0806   GLUCOSE 87 06/23/2023 0806   GLUCOSE 93 04/08/2020 0846   BUN 13 06/23/2023 0806   CREATININE 0.56 (L) 06/23/2023 0806   CREATININE 0.68 04/08/2020 0846   CALCIUM 9.4 06/23/2023 0806   PROT 6.3 06/23/2023 0806   ALBUMIN 4.4 06/23/2023 0806   AST 17 06/23/2023 0806   ALT 9 06/23/2023 0806   ALKPHOS 70 06/23/2023 0806   BILITOT 0.4 06/23/2023 0806   GFRNONAA 90 10/03/2019 0932   GFRAA 104 10/03/2019 0932    Latest Reference Range & Units 12/15/22 08:57  TSH 0.450 - 4.500  uIU/mL 1.530  T4,Free(Direct) 0.82 - 1.77 ng/dL 1.61    Lipid Panel     Component Value Date/Time   CHOL 177 06/23/2023 0805   TRIG 71 06/23/2023 0805   HDL 68 06/23/2023 0805   CHOLHDL 2.6 06/23/2023 0805   CHOLHDL 3.3 04/08/2020 0854   VLDL 24 12/08/2016 0829   LDLCALC 96 06/23/2023 0805   LDLCALC 126 (H) 04/08/2020 0854   June 28, 2018 labs TSH 2.9 7, free T4 1.2  DualFemur Neck Right 12/16/2022 76.3 Osteopenia -2.0 0.756 g/cm2 -4.7% Yes DualFemur Neck Right 12/11/2020 74.3 Osteopenia -1.8 0.793 g/cm2 3.3% - DualFemur Neck Right 11/03/2018 72.2 Osteopenia -1.9 0.768 g/cm2 -3.6% -    DualFemur Total Mean 12/16/2022 76.3 Osteopenia -1.6 0.806 g/cm2 -0.2% - DualFemur Total Mean 12/11/2020 74.3 Osteopenia -1.6 0.808 g/cm2 -0.6% - DualFemur Total Mean 11/03/2018 72.2 Osteopenia -1.5 0.813 g/cm2 1.9% -   Left Forearm Radius 33% 12/16/2022 76.3 Osteopenia -2.1 0.560 g/cm2 - - ASSESSMENT: The BMD measured at Forearm Radius 33% is 0.560 g/cm2 with a T-score of -2.1. This patient is considered osteopenic according to World Health Organization [facility]) criteria. The scan quality is good. Compared with the prior study on 12/11/20, the BMD of the total mean shows no statistically significant change. Lumbar spine was excluded due to advanced degenerative changes. Patient is not a candidate for FRAX assessment due to fosamax. .     Assessment & Plan:    1. Hypothyroidism Her previsit thyroid function tests are consistent with appropriate replacement.  She is advised to continue levothyroxine 50 mcg p.o. daily before breakfast.    - We discussed about the correct intake of her thyroid hormone, on empty stomach at fasting, with water, separated by at least 30 minutes from breakfast and other medications,  and separated by more than 4 hours from calcium, iron, multivitamins, acid reflux medications (PPIs). -Patient is made aware of the fact that thyroid hormone replacement is  needed for life, dose to be adjusted by periodic monitoring of thyroid function tests.  2.  Osteoporosis  -She was previously treated with  alendronate for 6 years.  Her last bone density was not satisfactory and hence she was switched to Prolia 60 mg subcutaneously every 6 months.  She has received her first dose last visit, she is ready for next dose today and every 6 months.  The importance of continued treatment with Prolia to avoid withdrawal fracture was discussed with her. -She is advised to continue vitamin D3  2,000 units daily . -She will continue with calcium carbonate 1200 mg daily with lunch. She will be seen in 6 months for follow-up.  She will also be considered for bone density in March 2026.     3. Hyperlipidemia:  - She has family history of hyperlipidemia and coronary artery disease.  Her previsit lipid panel shows LDL at 96 increasing from 66.  She has tolerated Crestor so far.  I discussed and increase her Crestor to 10 mg p.o. nightly.  Side effects and precautions discussed with her.  As an alternative, whole food plant-based diet was discussed in detail and the packet of lifestyle medicine was given to her.  - I advised patient to maintain close follow up with Kerri Perches, MD for primary care needs.   I spent  25  minutes in the care of the patient today including review of labs from Thyroid Function, CMP, and other relevant labs ; imaging/biopsy records (current and previous including abstractions from other facilities); face-to-face time discussing  her lab results and symptoms, medications doses, her options of short and long term treatment based on the latest standards of care / guidelines;   and documenting the encounter.  Amanda Harmon  participated in the discussions, expressed understanding, and voiced agreement with the above plans.  All questions were answered to her satisfaction. she is encouraged to contact clinic should she have any questions or  concerns prior to her return visit.    Follow up plan: Return in about 6 months (around 12/26/2023) for Fasting Labs  in AM B4 8, Prolia Today and Prolia NV.  Marquis Lunch, MD Phone: [phone]  Fax: [phone]   06/28/2023, 7:21 PM

## 2023-06-29 ENCOUNTER — Encounter: Payer: Self-pay | Admitting: Cardiovascular Disease

## 2023-06-29 DIAGNOSIS — M79674 Pain in right toe(s): Secondary | ICD-10-CM | POA: Diagnosis not present

## 2023-06-29 DIAGNOSIS — Q828 Other specified congenital malformations of skin: Secondary | ICD-10-CM | POA: Diagnosis not present

## 2023-07-02 DIAGNOSIS — J300 Vasomotor rhinitis: Secondary | ICD-10-CM | POA: Diagnosis not present

## 2023-07-02 DIAGNOSIS — R490 Dysphonia: Secondary | ICD-10-CM | POA: Diagnosis not present

## 2023-07-02 DIAGNOSIS — R0982 Postnasal drip: Secondary | ICD-10-CM | POA: Diagnosis not present

## 2023-07-02 DIAGNOSIS — K219 Gastro-esophageal reflux disease without esophagitis: Secondary | ICD-10-CM | POA: Diagnosis not present

## 2023-07-06 ENCOUNTER — Encounter: Payer: Self-pay | Admitting: Family Medicine

## 2023-07-06 MED ORDER — SCOPOLAMINE 1 MG/3DAYS TD PT72: 1.0000 | MEDICATED_PATCH | TRANSDERMAL | 0 refills | Status: DC

## 2023-07-14 ENCOUNTER — Ambulatory Visit (INDEPENDENT_AMBULATORY_CARE_PROVIDER_SITE_OTHER): Payer: PPO

## 2023-07-14 VITALS — Ht 67.0 in | Wt 130.0 lb

## 2023-07-14 DIAGNOSIS — Z0000 Encounter for general adult medical examination without abnormal findings: Secondary | ICD-10-CM | POA: Diagnosis not present

## 2023-07-14 NOTE — Patient Instructions (Signed)
Amanda Harmon , Thank you for taking time to come for your Medicare Wellness Visit. I appreciate your ongoing commitment to your health goals. Please review the following plan we discussed and let me know if I can assist you in the future.   Referrals/Orders/Follow-Ups/Clinician Recommendations:  Next Medicare Annual Wellness Visit: July 17, 2024 at 1:10pm virtual visit  Please bring [place] copies of your vaccinations that you received at [facility] or upload a picture to mychart so that we can update your vaccine history.   This is a list of the screening recommended for you and due dates:  Health Maintenance  Topic Date Due   COVID-19 Vaccine (8 - 2023-24 season) 08/09/2023   Mammogram  05/27/2024   Medicare Annual Wellness Visit  07/13/2024   DEXA scan (bone density measurement)  12/15/2024   DTaP/Tdap/Td vaccine (3 - Td or Tdap) 04/04/2033   Pneumonia Vaccine  Completed   Flu Shot  Completed   Hepatitis C Screening  Completed   Zoster (Shingles) Vaccine  Completed   HPV Vaccine  Aged Out   Colon Cancer Screening  Discontinued   Cologuard (Stool DNA test)  Discontinued    Advanced directives: (ACP Link)Information on Advanced Care Planning can be found at [facility] of [place] Advance Health Care Directives Advance Health Care Directives ([email or web address]   Next Medicare Annual Wellness Visit scheduled for next year: Yes  Preventive Care 65 Years and Older, Female Preventive care refers to lifestyle choices and visits with your health care provider that can promote health and wellness. Preventive care visits are also called wellness exams. What can I expect for my preventive care visit? Counseling Your health care provider may ask you questions about your: Medical history, including: Past medical problems. Family medical history. Pregnancy and menstrual history. History of falls. Current health, including: Memory and ability to understand (cognition). Emotional  well-being. Home life and relationship well-being. Sexual activity and sexual health. Lifestyle, including: Alcohol, nicotine or tobacco, and drug use. Access to firearms. Diet, exercise, and sleep habits. Work and work Astronomer. Sunscreen use. Safety issues such as seatbelt and bike helmet use. Physical exam Your health care provider will check your: Height and weight. These may be used to calculate your BMI (body mass index). BMI is a measurement that tells if you are at a healthy weight. Waist circumference. This measures the distance around your waistline. This measurement also tells if you are at a healthy weight and may help predict your risk of certain diseases, such as type 2 diabetes and high blood pressure. Heart rate and blood pressure. Body temperature. Skin for abnormal spots. What immunizations do I need?  Vaccines are usually given at various ages, according to a schedule. Your health care provider will recommend vaccines for you based on your age, medical history, and lifestyle or other factors, such as travel or where you work. What tests do I need? Screening Your health care provider may recommend screening tests for certain conditions. This may include: Lipid and cholesterol levels. Hepatitis C test. Hepatitis B test. HIV (human immunodeficiency virus) test. STI (sexually transmitted infection) testing, if you are at risk. Lung cancer screening. Colorectal cancer screening. Diabetes screening. This is done by checking your blood sugar (glucose) after you have not eaten for a while (fasting). Mammogram. Talk with your health care provider about how often you should have regular mammograms. BRCA-related cancer screening. This may be done if you have a family history of breast, ovarian, tubal, or peritoneal cancers. Bone  density scan. This is done to screen for osteoporosis. Talk with your health care provider about your test results, treatment options, and if  necessary, the need for more tests. Follow these instructions at home: Eating and drinking  Eat a diet that includes fresh fruits and vegetables, whole grains, lean protein, and low-fat dairy products. Limit your intake of foods with high amounts of sugar, saturated fats, and salt. Take vitamin and mineral supplements as recommended by your health care provider. Do not drink alcohol if your health care provider tells you not to drink. If you drink alcohol: Limit how much you have to 0-1 drink a day. Know how much alcohol is in your drink. In the [place] one drink equals one 12 oz bottle of beer (355 mL), one 5 oz glass of wine (148 mL), or one 1 oz glass of hard liquor (44 mL). Lifestyle Brush your teeth every morning and night with fluoride toothpaste. Floss one time each day. Exercise for at least 30 minutes 5 or more days each week. Do not use any products that contain nicotine or tobacco. These products include cigarettes, chewing tobacco, and vaping devices, such as e-cigarettes. If you need help quitting, ask your health care provider. Do not use drugs. If you are sexually active, practice safe sex. Use a condom or other form of protection in order to prevent STIs. Take aspirin only as told by your health care provider. Make sure that you understand how much to take and what form to take. Work with your health care provider to find out whether it is safe and beneficial for you to take aspirin daily. Ask your health care provider if you need to take a cholesterol-lowering medicine (statin). Find healthy ways to manage stress, such as: Meditation, yoga, or listening to music. Journaling. Talking to a trusted person. Spending time with friends and family. Minimize exposure to UV radiation to reduce your risk of skin cancer. Safety Always wear your seat belt while driving or riding in a vehicle. Do not drive: If you have been drinking alcohol. Do not ride with someone who has been  drinking. When you are tired or distracted. While texting. If you have been using any mind-altering substances or drugs. Wear a helmet and other protective equipment during sports activities. If you have firearms in your house, make sure you follow all gun safety procedures. What's next? Visit your health care provider once a year for an annual wellness visit. Ask your health care provider how often you should have your eyes and teeth checked. Stay up to date on all vaccines. This information is not intended to replace advice given to you by your health care provider. Make sure you discuss any questions you have with your health care provider. Document Revised: 03/12/2021 Document Reviewed: 03/12/2021 Elsevier Patient Education  2024 [employer]. Understanding Your Risk for Falls Millions of people have serious injuries from falls each year. It is important to understand your risk of falling. Talk with your health care provider about your risk and what you can do to lower it. If you do have a serious fall, make sure to tell your provider. Falling once raises your risk of falling again. How can falls affect me? Serious injuries from falls are common. These include: Broken bones, such as hip fractures. Head injuries, such as traumatic brain injuries (TBI) or concussions. A fear of falling can cause you to avoid activities and stay at home. This can make your muscles weaker and raise your  risk for a fall. What can increase my risk? There are a number of risk factors that increase your risk for falling. The more risk factors you have, the higher your risk of falling. Serious injuries from a fall happen most often to people who are older than 77 years old. Teenagers and young adults ages 36-29 are also at higher risk. Common risk factors include: Weakness in the lower body. Being generally weak or confused due to long-term (chronic) illness. Dizziness or balance problems. Poor  vision. Medicines that cause dizziness or drowsiness. These may include: Medicines for your blood pressure, heart, anxiety, insomnia, or swelling (edema). Pain medicines. Muscle relaxants. Other risk factors include: Drinking alcohol. Having had a fall in the past. Having foot pain or wearing improper footwear. Working at a dangerous job. Having any of the following in your home: Tripping hazards, such as floor clutter or loose rugs. Poor lighting. Pets. Having dementia or memory loss. What actions can I take to lower my risk of falling?     Physical activity Stay physically fit. Do strength and balance exercises. Consider taking a regular class to build strength and balance. Yoga and tai chi are good options. Vision Have your eyes checked every year and your prescription for glasses or contacts updated as needed. Shoes and walking aids Wear non-skid shoes. Wear shoes that have rubber soles and low heels. Do not wear high heels. Do not walk around the house in socks or slippers. Use a cane or walker as told by your provider. Home safety Attach secure railings on both sides of your stairs. Install grab bars for your bathtub, shower, and toilet. Use a non-skid mat in your bathtub or shower. Attach bath mats securely with double-sided, non-slip rug tape. Use good lighting in all rooms. Keep a flashlight near your bed. Make sure there is a clear path from your bed to the bathroom. Use night-lights. Do not use throw rugs. Make sure all carpeting is taped or tacked down securely. Remove all clutter from walkways and stairways, including extension cords. Repair uneven or broken steps and floors. Avoid walking on icy or slippery surfaces. Walk on the grass instead of on icy or slick sidewalks. Use ice melter to get rid of ice on walkways in the winter. Use a cordless phone. Questions to ask your health care provider Can you help me check my risk for a fall? Do any of my medicines  make me more likely to fall? Should I take a vitamin D supplement? What exercises can I do to improve my strength and balance? Should I make an appointment to have my vision checked? Do I need a bone density test to check for weak bones (osteoporosis)? Would it help to use a cane or a walker? Where to find more information Centers for Disease Control and Prevention, STEADI: TonerPromos.no Community-Based Fall Prevention Programs: TonerPromos.no [employer] on Aging: BaseRingTones.pl Contact a health care provider if: You fall at home. You are afraid of falling at home. You feel weak, drowsy, or dizzy. This information is not intended to replace advice given to you by your health care provider. Make sure you discuss any questions you have with your health care provider. Document Revised: 05/18/2022 Document Reviewed: 05/18/2022 Elsevier Patient Education  2024 [employer].

## 2023-07-14 NOTE — Progress Notes (Signed)
 Because this visit was a virtual/telehealth visit,  certain criteria was not obtained, such a blood pressure, CBG if applicable, and timed get up and go. Any medications not marked as "taking" were not mentioned during the medication reconciliation part of the visit. Any vitals not documented were not able to be obtained due to this being a telehealth visit or patient was unable to self-report a recent blood pressure reading due to a lack of equipment at home via telehealth. Vitals that have been documented are verbally provided by the patient.   Subjective:   Amanda Harmon is a 77 y.o. female who presents for Medicare Annual (Subsequent) preventive examination.  Visit Complete: Virtual I connected with  Amanda Harmon on 07/14/23 by a audio enabled telemedicine application and verified that I am speaking with the correct person using two identifiers.  Patient Location: Home  Provider Location: Home Office  I discussed the limitations of evaluation and management by telemedicine. The patient expressed understanding and agreed to proceed.  Vital Signs: Because this visit was a virtual/telehealth visit, some criteria may be missing or patient reported. Any vitals not documented were not able to be obtained and vitals that have been documented are patient reported.  Patient Medicare AWV questionnaire was completed by the patient on na; I have confirmed that all information answered by patient is correct and no changes since this date.  Cardiac Risk Factors include: advanced age (>66men, >86 women);dyslipidemia     Objective:    Today's Vitals   07/14/23 1307  Weight: 130 lb (59 kg)  Height: 5\' 7"  (1.702 m)   Body mass index is 20.36 kg/m.     07/14/2023    1:05 PM 08/06/2021    1:23 PM 07/18/2020    1:14 PM 07/18/2019    1:23 PM 04/27/2017   12:56 PM 04/13/2017    2:36 PM 12/15/2016    2:34 PM  Advanced Directives  Does Patient Have a Medical Advance Directive? No Yes No Yes  No No No  Type of Special educational needs teacher of [place];Living will  Healthcare Power of Hall;Living will     Does patient want to make changes to medical advance directive?  No - Patient declined       Copy of Healthcare Power of Attorney in Chart?  No - copy requested  No - copy requested     Would patient like information on creating a medical advance directive? No - Patient declined  No - Patient declined  No - Patient declined No - Patient declined Yes (MAU/Ambulatory/Procedural Areas - Information given)    Current Medications (verified) Outpatient Encounter Medications as of 07/14/2023  Medication Sig   amLODipine (NORVASC) 2.5 MG tablet Take 1 tablet (2.5 mg total) by mouth daily.   Ascorbic Acid (VITAMIN C) 1000 MG tablet Take 1,000 mg by mouth daily.   Ascorbic Acid Buffered (BUFFERED VITAMIN C) 1000 MG CAPS 1 capsule every day by oral route.   Azelastine HCl 137 MCG/SPRAY SOLN Place 1 spray into both nostrils 2 (two) times daily.   Calcium Carbonate-Vit D-Min (CALCIUM 1200 PO) Take 1 tablet by mouth daily.   denosumab (PROLIA) 60 MG/ML SOSY injection Inject 60 mg into the skin every 6 (six) months.   ipratropium (ATROVENT) 0.03 % nasal spray Place 1 spray into both nostrils 3 (three) times daily.   levothyroxine (SYNTHROID) 50 MCG tablet TAKE 1 TABLET BY MOUTH EVERY DAY IN THE MORNING   loratadine (CLARITIN) 10 MG tablet  Take 10 mg by mouth daily.   neomycin-polymyxin-dexameth (MAXITROL) 0.1 % OINT 1 Application.   omeprazole (PRILOSEC) 40 MG capsule Take 40 mg by mouth at bedtime.   rosuvastatin (CRESTOR) 10 MG tablet Take 1 tablet (10 mg total) by mouth at bedtime.   scopolamine (TRANSDERM-SCOP) 1 MG/3DAYS Place 1 patch (1.5 mg total) onto the skin every 3 (three) days.   No facility-administered encounter medications on file as of 07/14/2023.    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    Cystitis    Hyperlipidemia    borderline    Mitral valve prolapse 1984   Pericardial effusion    Thyroid disease 2015   hypothyroidism   Vertigo    Past Surgical History:  Procedure Laterality Date   BACK SURGERY  2008   BREAST BIOPSY  1998   left - benign   CARPECTOMY HAND Right 06/22/2016   COLONOSCOPY N/A 01/17/2015   Procedure: COLONOSCOPY;  Surgeon: Malissa Hippo, MD;  [facility];  Service: Endoscopy;  Laterality: N/A;  830 - moved to 4/21 @ 8:30   EYE SURGERY Left 12/01/2013   cataract   EYE SURGERY Right 12/15/2013   cataract   IR GENERIC HISTORICAL  08/19/2016   IR RADIOLOGIST EVAL & MGMT 08/19/2016 [facility]   SPINE SURGERY     TONSILLECTOMY  1965   TUBAL LIGATION  1983   Family History  Problem Relation Age of Onset   Heart attack Mother 29       used tobacco    Arthritis Mother        severe    Heart attack Father 15       used tobacco    Alcohol abuse Father    Cancer Brother 29       prostate    Osteoporosis Sister        severe    Osteoarthritis Sister    Social History   Socioeconomic History   Marital status: Married    Spouse name: Iantha Fallen   Number of children: 3   Years of education: master's degree   Highest education level: Master's degree (e.g., MA, MS, MEng, MEd, MSW, MBA)  Occupational History   Occupation: Metallurgist: RETIRED  Tobacco Use   Smoking status: Former    Current packs/day: 0.00    Average packs/day: 1 pack/day for 4.0 years (4.0 ttl pk-yrs)    Types: Cigarettes    Start date: 12/15/1964    Quit date: 12/15/1968    Years since quitting: 54.6   Smokeless tobacco: Never  Vaping Use   Vaping status: Never Used  Substance and Sexual Activity   Alcohol use: Yes    Comment: socially    Drug use: No   Sexual activity: Yes  Other Topics Concern   Not on file  Social History Narrative   Not on file   Social  Determinants of Health   Financial Resource Strain: Low Risk  (07/14/2023)   Overall Financial Resource Strain (CARDIA)    Difficulty of Paying Living Expenses: Not hard at all  Food Insecurity: No Food Insecurity (07/14/2023)   Hunger Vital Sign    Worried About Running Out of Food in the Last Year: Never true    Ran Out of Food in the Last Year: Never true  Transportation Needs: No Transportation Needs (  07/14/2023)   PRAPARE - Administrator, Civil Service (Medical): No    Lack of Transportation (Non-Medical): No  Physical Activity: Sufficiently Active (07/14/2023)   Exercise Vital Sign    Days of Exercise per Week: 5 days    Minutes of Exercise per Session: 90 min  Stress: Stress Concern Present (07/14/2023)   [employer] of Occupational Health - Occupational Stress Questionnaire    Feeling of Stress : To some extent  Social Connections: Socially Integrated (07/14/2023)   Social Connection and Isolation Panel [NHANES]    Frequency of Communication with Friends and Family: More than three times a week    Frequency of Social Gatherings with Friends and Family: More than three times a week    Attends Religious Services: More than 4 times per year    Active Member of [employer] or Organizations: Yes    Attends Engineer, structural: More than 4 times per year    Marital Status: Married    Tobacco Counseling Counseling given: Yes   Clinical Intake:  Pre-visit preparation completed: Yes  Pain : No/denies pain     BMI - recorded: 20.36 Nutritional Status: BMI of 19-24  Normal Nutritional Risks: None Diabetes: No  How often do you need to have someone help you when you read instructions, pamphlets, or other written materials from your doctor or pharmacy?: 1 - Never  Interpreter Needed?: No  Information entered by ::  Sharia Averitt, CMA   Activities of Daily Living    07/14/2023    1:17 PM  In your present state of health, do you have any  difficulty performing the following activities:  Hearing? 0  Vision? 0  Difficulty concentrating or making decisions? 0  Walking or climbing stairs? 0  Dressing or bathing? 0  Doing errands, shopping? 0  Preparing Food and eating ? N  Using the Toilet? N  In the past six months, have you accidently leaked urine? N  Do you have problems with loss of bowel control? N  Managing your Medications? N  Managing your Finances? N  Housekeeping or managing your Housekeeping? N    Patient Care Team: Kerri Perches, MD as PCP - General (Family Medicine) Kathleene Hazel, MD as PCP - Cardiology (Cardiology) Kathleene Hazel, MD as Consulting Physician (Cardiology)  Indicate any recent Medical Services you may have received from other than [facility] providers in the past year (date may be approximate).     Assessment:   This is a routine wellness examination for Amanda Harmon.  Hearing/Vision screen Hearing Screening - Comments:: Patient denies any hearing difficulties.   Vision Screening - Comments:: Patient uses reading glasses only and is up to date with yearly eye exams.     Goals Addressed             This Visit's Progress    Patient Stated       Work on strength training        Depression Screen    07/14/2023    1:11 PM 03/11/2023    1:05 PM 09/29/2022    2:14 PM 08/06/2021    1:24 PM 08/06/2021    1:22 PM 07/18/2020    1:15 PM 07/18/2020    1:12 PM  PHQ 2/9 Scores  PHQ - 2 Score 0 0 0 0 0 0 0  PHQ- 9 Score 0          Fall Risk    07/14/2023    1:17 PM 03/11/2023  1:04 PM 09/29/2022    2:13 PM 09/12/2021    7:30 AM 08/06/2021    1:24 PM  Fall Risk   Falls in the past year? 0 1 0 1 1  Number falls in past yr: 0 0 0 0 0  Injury with Fall? 0 0 0 0 0  Risk for fall due to : History of fall(s) History of fall(s) No Fall Risks  No Fall Risks  Follow up Falls prevention discussed;Education provided Falls evaluation completed Falls evaluation completed   Falls evaluation completed    MEDICARE RISK AT HOME: Medicare Risk at Home Any stairs in or around the home?: Yes If so, are there any without handrails?: No Home free of loose throw rugs in walkways, pet beds, electrical cords, etc?: Yes Adequate lighting in your home to reduce risk of falls?: Yes Life alert?: No Use of a cane, walker or w/c?: No Grab bars in the bathroom?: No Shower chair or bench in shower?: Yes Elevated toilet seat or a handicapped toilet?: No  TIMED UP AND GO:  Was the test performed?  No    Cognitive Function:    08/06/2021    1:26 PM  MMSE - Mini Mental State Exam  Not completed: Unable to complete        07/14/2023    1:10 PM 08/06/2021    1:26 PM 07/18/2020    1:20 PM 07/18/2019    1:26 PM 07/13/2018    3:08 PM  6CIT Screen  What Year? 0 points 0 points 0 points 0 points 0 points  What month? 0 points 0 points 0 points 0 points 0 points  What time? 0 points 0 points 0 points 0 points 0 points  Count back from 20 0 points 0 points 0 points 0 points 0 points  Months in reverse 0 points 0 points 0 points 0 points 0 points  Repeat phrase 0 points 0 points 0 points 0 points 0 points  Total Score 0 points 0 points 0 points 0 points 0 points    Immunizations Immunization History  Administered Date(s) Administered   Fluad Quad(high Dose 65+) 07/19/2020, 08/05/2022   Influenza Split 06/17/2014, 07/23/2017   Influenza, High Dose Seasonal PF 07/05/2018, 06/12/2019   Influenza,inj,Quad PF,6+ Mos 07/20/2013, 07/01/2015, 07/15/2016   Influenza-Unspecified 07/22/2021   Moderna Covid-19 Vaccine Bivalent Booster 31yrs & up 07/03/2022, 06/14/2023   Moderna Sars-Covid-2 Vaccination 11/02/2019, 12/05/2019, 07/23/2020, 04/04/2021, 08/11/2021   Pneumococcal Conjugate-13 10/30/2014, 08/23/2017   Pneumococcal Polysaccharide-23 03/23/2012   Rsv, Bivalent, Protein Subunit Rsvpref,pf Verdis Frederickson) 07/20/2022   Tdap 12/09/2012, 04/05/2023   Zoster  Recombinant(Shingrix) 08/02/2019, 02/09/2020   Zoster, Live 02/24/2007    TDAP status: Due, Education has been provided regarding the importance of this vaccine. Advised may receive this vaccine at local pharmacy or Health Dept. Aware to provide a copy of the vaccination record if obtained from local pharmacy or Health Dept. Verbalized acceptance and understanding.  Flu Vaccine status: Up to date  Pneumococcal vaccine status: Up to date  Covid-19 vaccine status: Information provided on how to obtain vaccines.   Qualifies for Shingles Vaccine? No   Zostavax completed Yes   Shingrix Completed?: Yes  Screening Tests Health Maintenance  Topic Date Due   Medicare Annual Wellness (AWV)  08/06/2022   COVID-19 Vaccine (8 - 2023-24 season) 08/09/2023   DTaP/Tdap/Td (3 - Td or Tdap) 04/04/2033   Pneumonia Vaccine 65+ Years old  Completed   INFLUENZA VACCINE  Completed   DEXA SCAN  Completed  Hepatitis C Screening  Completed   Zoster Vaccines- Shingrix  Completed   HPV VACCINES  Aged Out   Colonoscopy  Discontinued   Fecal DNA (Cologuard)  Discontinued    Health Maintenance  Health Maintenance Due  Topic Date Due   Medicare Annual Wellness (AWV)  08/06/2022    Colorectal cancer screening: No longer required.   Mammogram status: Completed 05/28/2023. Repeat every year  Bone Density status: Completed 12/16/2022. Results reflect: Bone density results: OSTEOPENIA. Repeat every 2 years.  Lung Cancer Screening: (Low Dose CT Chest recommended if Age 7-80 years, 20 pack-year currently smoking OR have quit w/in 15years.) does not qualify.   Lung Cancer Screening Referral: na  Additional Screening:  Hepatitis C Screening: does not qualify; Completed 04/06/2016  Vision Screening: Recommended annual ophthalmology exams for early detection of glaucoma and other disorders of the eye. Is the patient up to date with their annual eye exam?  Yes  Who is the provider or what is the name of  the office in which the patient attends annual eye exams? Dr. Gardenia Phlegm and Dr. Raquel James If pt is not established with a provider, would they like to be referred to a provider to establish care? No .   Dental Screening: Recommended annual dental exams for proper oral hygiene  Diabetic Foot Exam: na  Community Resource Referral / Chronic Care Management: CRR required this visit?  No   CCM required this visit?  No     Plan:     I have personally reviewed and noted the following in the patient's chart:   Medical and social history Use of alcohol, tobacco or illicit drugs  Current medications and supplements including opioid prescriptions. Patient is not currently taking opioid prescriptions. Functional ability and status Nutritional status Physical activity Advanced directives List of other physicians Hospitalizations, surgeries, and ER visits in previous 12 months Vitals Screenings to include cognitive, depression, and falls Referrals and appointments  In addition, I have reviewed and discussed with patient certain preventive protocols, quality metrics, and best practice recommendations. A written personalized care plan for preventive services as well as general preventive health recommendations were provided to patient.     Jordan Hawks Kaelen Brennan, CMA   07/14/2023   After Visit Summary: (MyChart) Due to this being a telephonic visit, the after visit summary with patients personalized plan was offered to patient via MyChart

## 2023-07-21 ENCOUNTER — Other Ambulatory Visit: Payer: Self-pay | Admitting: Physician Assistant

## 2023-07-21 ENCOUNTER — Other Ambulatory Visit: Payer: Self-pay

## 2023-07-21 ENCOUNTER — Encounter: Payer: Self-pay | Admitting: Family Medicine

## 2023-07-21 DIAGNOSIS — R300 Dysuria: Secondary | ICD-10-CM

## 2023-07-22 LAB — URINALYSIS
Bilirubin, UA: NEGATIVE
Glucose, UA: NEGATIVE
Ketones, UA: NEGATIVE
Leukocytes,UA: NEGATIVE
Nitrite, UA: NEGATIVE
Protein,UA: NEGATIVE
Specific Gravity, UA: 1.02 (ref 1.005–1.030)
Urobilinogen, Ur: 0.2 mg/dL (ref 0.2–1.0)
pH, UA: 5.5 (ref 5.0–7.5)

## 2023-07-23 ENCOUNTER — Encounter: Payer: Self-pay | Admitting: Family Medicine

## 2023-07-23 LAB — URINE CULTURE: Organism ID, Bacteria: NO GROWTH

## 2023-07-25 ENCOUNTER — Ambulatory Visit
Admission: EM | Admit: 2023-07-25 | Discharge: 2023-07-25 | Disposition: A | Discharge status: Home / Self Care | Payer: PPO | Attending: Internal Medicine | Admitting: Internal Medicine

## 2023-07-25 DIAGNOSIS — N898 Other specified noninflammatory disorders of vagina: Secondary | ICD-10-CM

## 2023-07-25 LAB — POCT URINALYSIS DIP (MANUAL ENTRY)
Bilirubin, UA: NEGATIVE
Glucose, UA: NEGATIVE mg/dL
Ketones, POC UA: NEGATIVE mg/dL
Leukocytes, UA: NEGATIVE
Nitrite, UA: NEGATIVE
Protein Ur, POC: NEGATIVE mg/dL
Spec Grav, UA: 1.02 (ref 1.010–1.025)
Urobilinogen, UA: 1 U/dL
pH, UA: 6.5 (ref 5.0–8.0)

## 2023-07-25 NOTE — ED Triage Notes (Signed)
Pt reports frequent urination and burning without urination x 1 week.     Pcp did UA and UC but came back clear

## 2023-07-25 NOTE — Discharge Instructions (Addendum)
Symptoms concerning for vaginal irritation.  We will send a vaginal swab for BV and yeast infection and follow-up with results.  Avoid sexual activity until the results are back and we have followed up with you.  Additionally you may follow-up with your primary care provider for a urology and gynecological consult if these results are negative.

## 2023-07-25 NOTE — ED Provider Notes (Signed)
[facility]    CSN: [id number] Arrival date & time: 07/25/23  0272      History   Chief Complaint No chief complaint on file.   HPI Amanda Harmon is a 77 y.o. female.   Endorses a burning sensation in the vaginal area that she notices at night.  It does not wake her up in the middle of the night, however it is uncomfortable.  She does not notice it as much during the day, however she states she does stay quite busy.  She denies a burning sensation when she urinates and her UA from her PCP came back negative.  Endorses urinary frequency, however she states this is not abnormal for her.  She does not wake up in the middle of the night to urinate.  She states she has not had a urinary tract infection in a while but the sensation is similar to what she has noticed in the past.  Denies a history of yeast infections, does state that her daughter gets them frequently.  Denies redness, bumps, sores in the vaginal area.  Denies painful sex.  Does endorse a small amount of discharge, but is unable to describe it.  After completing her swab she noted vaginal dryness and discomfort.      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    Cystitis    Hyperlipidemia    borderline    Mitral valve prolapse 1984   Pericardial effusion    Thyroid disease 2015   hypothyroidism   Vertigo     Patient Active Problem List   Diagnosis Date Noted   Change in voice 03/19/2023   Mixed hyperlipidemia 12/24/2022   Lumbar pain 10/03/2022   Essential hypertension 09/15/2021   Allergic rhinitis 10/21/2017   Osteopenia 08/24/2017   At high risk for injury related to fall 08/30/2016   Vitamin D deficiency 08/07/2016   Compression fracture of first lumbar vertebra (HCC) 07/15/2016   Annual physical exam 04/27/2015   FH: CAD (coronary artery disease) 10/31/2014   Hypothyroidism 02/26/2014   Cystitis 12/07/2010   Postmenopausal osteoporosis of multiple  sites 03/26/2010   Dyslipidemia 02/16/2008    Past Surgical History:  Procedure Laterality Date   BACK SURGERY  2008   BREAST BIOPSY  1998   left - benign   CARPECTOMY HAND Right 06/22/2016   COLONOSCOPY N/A 01/17/2015   Procedure: COLONOSCOPY;  Surgeon: Malissa Hippo, MD;  [facility];  Service: Endoscopy;  Laterality: N/A;  830 - moved to 4/21 @ 8:30   EYE SURGERY Left 12/01/2013   cataract   EYE SURGERY Right 12/15/2013   cataract   IR GENERIC HISTORICAL  08/19/2016   IR RADIOLOGIST EVAL & MGMT 08/19/2016 [facility]   SPINE SURGERY     TONSILLECTOMY  1965   TUBAL LIGATION  1983    OB History   No obstetric history on file.      Home Medications    Prior to Admission medications   Medication Sig Start Date End Date Taking? Authorizing Provider  amLODipine (NORVASC) 2.5 MG tablet TAKE 1 TABLET BY MOUTH EVERY DAY 07/21/23   Sharlene Dory, PA-C  Ascorbic Acid (VITAMIN C) 1000 MG tablet Take 1,000 mg by mouth daily.    [provider]  Ascorbic Acid Buffered (BUFFERED VITAMIN C) 1000 MG CAPS 1 capsule every day by oral route.    [provider]  Azelastine HCl 137 MCG/SPRAY  SOLN Place 1 spray into both nostrils 2 (two) times daily. 07/02/23   [provider]  Calcium Carbonate-Vit D-Min (CALCIUM 1200 PO) Take 1 tablet by mouth daily.    [provider]  denosumab (PROLIA) 60 MG/ML SOSY injection Inject 60 mg into the skin every 6 (six) months. 12/24/22   Roma Kayser, MD  ipratropium (ATROVENT) 0.03 % nasal spray Place 1 spray into both nostrils 3 (three) times daily. 07/02/23   [provider]  levothyroxine (SYNTHROID) 50 MCG tablet TAKE 1 TABLET BY MOUTH EVERY DAY IN THE MORNING 06/28/23   Roma Kayser, MD  loratadine (CLARITIN) 10 MG tablet Take 10 mg by mouth daily.    [provider]  neomycin-polymyxin-dexameth (MAXITROL) 0.1 % OINT 1 Application.    [provider]   omeprazole (PRILOSEC) 40 MG capsule Take 40 mg by mouth at bedtime. 05/07/23   [provider]  rosuvastatin (CRESTOR) 10 MG tablet Take 1 tablet (10 mg total) by mouth at bedtime. 06/28/23   Roma Kayser, MD  scopolamine (TRANSDERM-SCOP) 1 MG/3DAYS Place 1 patch (1.5 mg total) onto the skin every 3 (three) days. 07/06/23   Kerri Perches, MD    Family History Family History  Problem Relation Age of Onset   Heart attack Mother 3       used tobacco    Arthritis Mother        severe    Heart attack Father 75       used tobacco    Alcohol abuse Father    Cancer Brother 9       prostate    Osteoporosis Sister        severe    Osteoarthritis Sister     Social History Social History   Tobacco Use   Smoking status: Former    Current packs/day: 0.00    Average packs/day: 1 pack/day for 4.0 years (4.0 ttl pk-yrs)    Types: Cigarettes    Start date: 12/15/1964    Quit date: 12/15/1968    Years since quitting: 54.6   Smokeless tobacco: Never  Vaping Use   Vaping status: Never Used  Substance Use Topics   Alcohol use: Yes    Comment: socially    Drug use: No     Allergies   Aspirin and Poison oak extract [poison oak extract]   Review of Systems Review of Systems Per HPI  Physical Exam Triage Vital Signs ED Triage Vitals  Encounter Vitals Group     BP 07/25/23 0943 134/78     Systolic BP Percentile --      Diastolic BP Percentile --      Pulse Rate 07/25/23 0943 79     Resp 07/25/23 0943 16     Temp 07/25/23 0943 97.9 F (36.6 C)     Temp Source 07/25/23 0943 Oral     SpO2 07/25/23 0943 98 %     Weight --      Height --      Head Circumference --      Peak Flow --      Pain Score 07/25/23 0944 3     Pain Loc --      Pain Education --      Exclude from Growth Chart --    No data found.  Updated Vital Signs BP 134/78 (BP Location: Right Arm)   Pulse 79   Temp 97.9 F (36.6 C) (Oral)   Resp 16  SpO2 98%   Visual Acuity Right  Eye Distance:   Left Eye Distance:   Bilateral Distance:    Right Eye Near:   Left Eye Near:    Bilateral Near:     Physical Exam Constitutional:      Appearance: Normal appearance. She is normal weight.  HENT:     Head: Normocephalic.     Mouth/Throat:     Mouth: Mucous membranes are moist.  Eyes:     Extraocular Movements: Extraocular movements intact.  Cardiovascular:     Rate and Rhythm: Regular rhythm.  Pulmonary:     Effort: Pulmonary effort is normal.  Musculoskeletal:        General: Normal range of motion.     Cervical back: Normal range of motion.  Skin:    General: Skin is warm and dry.  Neurological:     General: No focal deficit present.     Mental Status: She is alert and oriented to person, place, and time.  Psychiatric:        Mood and Affect: Mood normal.        Behavior: Behavior normal.        Thought Content: Thought content normal.        Judgment: Judgment normal.      UC Treatments / Results  Labs (all labs ordered are listed, but only abnormal results are displayed) Labs Reviewed  POCT URINALYSIS DIP (MANUAL ENTRY) - Abnormal; Notable for the following components:      Result Value   Blood, UA trace-intact (*)    All other components within normal limits  CERVICOVAGINAL ANCILLARY ONLY    EKG   Radiology No results found.  Procedures Procedures (including critical care time)  Medications Ordered in UC Medications - No data to display  Initial Impression / Assessment and Plan / UC Course  I have reviewed the triage vital signs and the nursing notes.  Pertinent labs & imaging results that were available during my care of the patient were reviewed by me and considered in my medical decision making (see chart for details).   Symptoms consistent with vaginal irritation given that she is not having any pain with urination or abnormal urinary frequency.  After completing her swabs she noted discomfort and dryness in her vagina.   Cervicovaginal swab done for BV and yeast.  Will send in appropriate prescriptions once the results are back.  Additionally recommend following up with PCP for vaginal dryness.       Final Clinical Impressions(s) / UC Diagnoses   Final diagnoses:  Vaginal irritation     Discharge Instructions      Symptoms concerning for vaginal irritation.  We will send a vaginal swab for BV and yeast infection and follow-up with results.  Avoid sexual activity until the results are back and we have followed up with you.  Additionally you may follow-up with your primary care provider for a urology and gynecological consult if these results are negative.     ED Prescriptions   None    PDMP not reviewed this encounter.   Elmer Picker, NP 07/25/23 1021

## 2023-07-26 ENCOUNTER — Encounter: Payer: Self-pay | Admitting: Family Medicine

## 2023-07-26 LAB — CERVICOVAGINAL ANCILLARY ONLY
Bacterial Vaginitis (gardnerella): NEGATIVE
Candida Glabrata: NEGATIVE
Candida Vaginitis: NEGATIVE
Comment: NEGATIVE
Comment: NEGATIVE
Comment: NEGATIVE

## 2023-07-27 ENCOUNTER — Other Ambulatory Visit: Payer: Self-pay

## 2023-07-27 DIAGNOSIS — R300 Dysuria: Secondary | ICD-10-CM

## 2023-07-27 MED ORDER — ESTROGENS CONJUGATED 0.625 MG/GM VA CREA: TOPICAL_CREAM | VAGINAL | 12 refills | Status: AC

## 2023-08-05 ENCOUNTER — Other Ambulatory Visit: Payer: Self-pay

## 2023-08-13 ENCOUNTER — Other Ambulatory Visit: Payer: Self-pay | Admitting: Physician Assistant

## 2023-09-13 ENCOUNTER — Other Ambulatory Visit: Payer: Self-pay | Admitting: Physician Assistant

## 2023-09-17 ENCOUNTER — Encounter: Payer: Self-pay | Admitting: Family Medicine

## 2023-10-13 ENCOUNTER — Ambulatory Visit: Payer: PPO | Admitting: Family Medicine

## 2023-10-31 ENCOUNTER — Other Ambulatory Visit: Payer: Self-pay | Admitting: "Endocrinology

## 2023-11-02 ENCOUNTER — Encounter: Payer: Self-pay | Admitting: Family Medicine

## 2023-11-02 ENCOUNTER — Ambulatory Visit (INDEPENDENT_AMBULATORY_CARE_PROVIDER_SITE_OTHER): Payer: HMO | Admitting: Family Medicine

## 2023-11-02 VITALS — BP 138/78 | HR 87 | Ht 67.0 in | Wt 134.0 lb

## 2023-11-02 DIAGNOSIS — E038 Other specified hypothyroidism: Secondary | ICD-10-CM | POA: Diagnosis not present

## 2023-11-02 DIAGNOSIS — I10 Essential (primary) hypertension: Secondary | ICD-10-CM

## 2023-11-02 DIAGNOSIS — E559 Vitamin D deficiency, unspecified: Secondary | ICD-10-CM | POA: Diagnosis not present

## 2023-11-02 DIAGNOSIS — M8580 Other specified disorders of bone density and structure, unspecified site: Secondary | ICD-10-CM | POA: Diagnosis not present

## 2023-11-02 DIAGNOSIS — E785 Hyperlipidemia, unspecified: Secondary | ICD-10-CM | POA: Diagnosis not present

## 2023-11-02 DIAGNOSIS — N3090 Cystitis, unspecified without hematuria: Secondary | ICD-10-CM | POA: Diagnosis not present

## 2023-11-02 DIAGNOSIS — E782 Mixed hyperlipidemia: Secondary | ICD-10-CM | POA: Diagnosis not present

## 2023-11-02 DIAGNOSIS — Z1231 Encounter for screening mammogram for malignant neoplasm of breast: Secondary | ICD-10-CM | POA: Diagnosis not present

## 2023-11-02 MED ORDER — SULFAMETHOXAZOLE-TRIMETHOPRIM 800-160 MG PO TABS: 1.0000 | ORAL_TABLET | Freq: Every day | ORAL | 0 refills | Status: DC

## 2023-11-02 NOTE — Patient Instructions (Addendum)
 Annual exam early September in office, call if you need me sooner  Mammogram due in September , pls schedule at checkout  GREAT that you are doing very well  Please starch strengthening / balance exercises  You are referred to Cardiology for follow up   Please get fasting lipid, cmp and EGFr, CBC and vit d levels 1 week before your September appointment  Thanks for choosing [facility], we consider it a privelige to serve you.

## 2023-11-28 NOTE — Assessment & Plan Note (Signed)
 Sub optimal control, will follow recommendation by Cardiology once she is assesed DASH diet and commitment to daily physical activity for a minimum of 30 minutes discussed and encouraged, as a part of hypertension management. The importance of attaining a healthy weight is also discussed.     11/02/2023    1:46 PM 07/25/2023    9:43 AM 07/14/2023    1:07 PM 06/28/2023    3:07 PM 03/11/2023    1:31 PM 03/11/2023    1:03 PM 03/03/2023    9:05 AM  BP/Weight  Systolic BP 138 134 -- 136 130 133 127  Diastolic BP 78 78 -- 68 70 71 78  Wt. (Lbs) 134  130 131.6  131.08   BMI 20.99 kg/m2  20.36 kg/m2 20.61 kg/m2  20.53 kg/m2

## 2023-11-28 NOTE — Assessment & Plan Note (Signed)
 On prolia , tolerating well, managed by [facility]

## 2023-11-28 NOTE — Progress Notes (Signed)
   Amanda Harmon     MRN: [id number]      DOB: [birth year]  Chief Complaint  Patient presents with   Follow-up    Follow up    HPI Amanda Harmon is here for follow up and re-evaluation of chronic medical conditions, medication management and review of any available recent lab and radiology data.  Preventive health is updated, specifically  Cancer screening and Immunization.   Questions or concerns regarding consultations or procedures which the PT has had in the interim are  addressed. The PT denies any adverse reactions to current medications since the last visit.  Requests appt with Cardiologist for review of lipids as well as blood pressure  ROS Denies recent fever or chills. Denies sinus pressure, nasal congestion, ear pain or sore throat. Denies chest congestion, productive cough or wheezing. Denies chest pains, palpitations and leg swelling Denies abdominal pain, nausea, vomiting,diarrhea or constipation.   Denies dysuria, frequency, hesitancy or incontinence. Denies joint pain, swelling and limitation in mobility. Denies headaches, seizures, numbness, or tingling. Denies depression, anxiety or insomnia. Denies skin break down or rash.   PE  BP 138/78 (BP Location: Right Arm, Patient Position: Sitting, Cuff Size: Large)   Pulse 87   Ht 5' 7 (1.702 m)   Wt 134 lb (60.8 kg)   SpO2 95%   BMI 20.99 kg/m   Patient alert and oriented and in no cardiopulmonary distress.  HEENT: No facial asymmetry, EOMI,     Neck supple .  Chest: Clear to auscultation bilaterally.  CVS: S1, S2 no murmurs, no S3.Regular rate.  ABD: Soft non tender.   Ext: No edema  MS: Adequate ROM spine, shoulders, hips and knees.  Skin: Intact, no ulcerations or rash noted.  Psych: Good eye contact, normal affect. Memory intact not anxious or depressed appearing.  CNS: CN 2-12 intact, power,  normal throughout.no focal deficits noted.   Assessment & Plan  Mixed  hyperlipidemia Hyperlipidemia:Low fat diet discussed and encouraged.   Lipid Panel  Lab Results  Component Value Date   CHOL 177 06/23/2023   HDL 68 06/23/2023   LDLCALC 96 06/23/2023   TRIG 71 06/23/2023   CHOLHDL 2.6 06/23/2023     Recommend f/u prior to Sept appt, however on review of her record, mixed messaging re current control and requested Cardiology advice since 05/2023. Will refer to Cardiology and let Cardiologist determine panel needed and optimal med management  Hypothyroidism Managed by Endo ,stable  Essential hypertension Sub optimal control, will follow recommendation by Cardiology once she is assesed DASH diet and commitment to daily physical activity for a minimum of 30 minutes discussed and encouraged, as a part of hypertension management. The importance of attaining a healthy weight is also discussed.     11/02/2023    1:46 PM 07/25/2023    9:43 AM 07/14/2023    1:07 PM 06/28/2023    3:07 PM 03/11/2023    1:31 PM 03/11/2023    1:03 PM 03/03/2023    9:05 AM  BP/Weight  Systolic BP 138 134 -- 136 130 133 127  Diastolic BP 78 78 -- 68 70 71 78  Wt. (Lbs) 134  130 131.6  131.08   BMI 20.99 kg/m2  20.36 kg/m2 20.61 kg/m2  20.53 kg/m2        Osteopenia On prolia  , tolerating well, managed by Endoi  Cystitis As needed septra  is effective, will continue same

## 2023-11-28 NOTE — Assessment & Plan Note (Signed)
 As needed septra is effective, will continue same

## 2023-11-28 NOTE — Assessment & Plan Note (Signed)
Managed by Endo, stable 

## 2023-11-28 NOTE — Assessment & Plan Note (Signed)
 Hyperlipidemia:Low fat diet discussed and encouraged.   Lipid Panel  Lab Results  Component Value Date   CHOL 177 06/23/2023   HDL 68 06/23/2023   LDLCALC 96 06/23/2023   TRIG 71 06/23/2023   CHOLHDL 2.6 06/23/2023     Recommend f/u prior to Sept appt, however on review of her record, mixed messaging re current control and requested Cardiology advice since 05/2023. Will refer to Cardiology and let Cardiologist determine panel needed and optimal med management

## 2023-12-02 DIAGNOSIS — H43391 Other vitreous opacities, right eye: Secondary | ICD-10-CM | POA: Diagnosis not present

## 2023-12-02 DIAGNOSIS — Z961 Presence of intraocular lens: Secondary | ICD-10-CM | POA: Diagnosis not present

## 2023-12-02 DIAGNOSIS — H524 Presbyopia: Secondary | ICD-10-CM | POA: Diagnosis not present

## 2023-12-02 DIAGNOSIS — H35363 Drusen (degenerative) of macula, bilateral: Secondary | ICD-10-CM | POA: Diagnosis not present

## 2023-12-02 DIAGNOSIS — H04123 Dry eye syndrome of bilateral lacrimal glands: Secondary | ICD-10-CM | POA: Diagnosis not present

## 2023-12-07 ENCOUNTER — Other Ambulatory Visit: Payer: Self-pay

## 2023-12-09 ENCOUNTER — Other Ambulatory Visit: Payer: Self-pay | Admitting: Pharmacy Technician

## 2023-12-09 ENCOUNTER — Other Ambulatory Visit: Payer: Self-pay | Admitting: Physician Assistant

## 2023-12-09 ENCOUNTER — Other Ambulatory Visit: Payer: Self-pay | Admitting: "Endocrinology

## 2023-12-09 ENCOUNTER — Other Ambulatory Visit: Payer: Self-pay

## 2023-12-09 DIAGNOSIS — M810 Age-related osteoporosis without current pathological fracture: Secondary | ICD-10-CM

## 2023-12-09 MED ORDER — PROLIA 60 MG/ML ~~LOC~~ SOSY
60.0000 mg | PREFILLED_SYRINGE | SUBCUTANEOUS | 1 refills | Status: DC
  Filled 2023-12-23: qty 1, 180d supply, fill #0
  Filled 2024-06-15: qty 1, 180d supply, fill #1
  Filled ????-??-??: fill #0

## 2023-12-09 NOTE — Progress Notes (Signed)
 Specialty Pharmacy Refill Coordination Note  Amanda Harmon is a 78 y.o. female contacted today regarding refills of specialty medication(s) Denosumab (Prolia)   Patient requested Courier to Provider Office   Delivery date: 12/20/23   Verified address: [facility]-[address]   Medication will be filled on 12/17/23.  RR sent to MD

## 2023-12-10 ENCOUNTER — Other Ambulatory Visit: Payer: Self-pay

## 2023-12-10 ENCOUNTER — Ambulatory Visit: Attending: Cardiovascular Disease | Admitting: Cardiovascular Disease

## 2023-12-10 ENCOUNTER — Encounter: Payer: Self-pay | Admitting: Cardiovascular Disease

## 2023-12-10 VITALS — BP 132/70 | HR 79 | Ht 67.0 in | Wt 130.8 lb

## 2023-12-10 DIAGNOSIS — R490 Dysphonia: Secondary | ICD-10-CM | POA: Diagnosis not present

## 2023-12-10 DIAGNOSIS — I10 Essential (primary) hypertension: Secondary | ICD-10-CM | POA: Diagnosis not present

## 2023-12-10 DIAGNOSIS — R0982 Postnasal drip: Secondary | ICD-10-CM | POA: Diagnosis not present

## 2023-12-10 NOTE — Patient Instructions (Signed)
Medication Instructions:  No changes *If you need a refill on your cardiac medications before your next appointment, please call your pharmacy*   Lab Work: none If you have labs (blood work) drawn today and your tests are completely normal, you will receive your results only by: [place] (if you have MyChart) OR A paper copy in the mail If you have any lab test that is abnormal or we need to change your treatment, we will call you to review the results.   Testing/Procedures: none   Follow-Up: At [facility], you and your health needs are our priority.  As part of our continuing mission to provide you with exceptional heart care, we have created designated Provider Care Teams.  These Care Teams include your primary Cardiologist (physician) and Advanced Practice Providers (APPs -  Physician Assistants and Nurse Practitioners) who all work together to provide you with the care you need, when you need it.   Your next appointment:   12 month(s)  Provider:   Lauree Chandler, MD     Other Instructions

## 2023-12-10 NOTE — Progress Notes (Signed)
 Chief Complaint  Patient presents with   Follow-up    HTN   History of Present Illness: 78 yo female with history of borderline hyperlipidemia, HTN and vertigo  who is here today for cardiac follow up. She had atypical chest pain in 2011 during a stressful situation and then other episodes of chest pain and dyspnea at rest in 2012. Echo 02/17/11 with normal LV systolic function, grade 2 diastolic dysfunction. Exercise stress test 02/17/11 with good exercise tolerance and no ischemic EKG changes. Her EKG chronically shows poor R wave progression in the precordial leads but this does not represent a prior infarct. She was seen in the office 05/25/19 with c/o dizziness and rapid heart rates. Echo 06/12/19 with LVEF=60-65%, no valve disease, small circumferential pericardial effusion. Carotid artery dopplers performed for right carotid bruit in September 2020 with no evidence of carotid artery disease. Last seen in our office September 2023 with no cardiac complaints.   She is here today for follow up. The patient denies any chest pain, dyspnea, palpitations, lower extremity edema, orthopnea, PND, dizziness, near syncope or syncope.   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    Cystitis    Hyperlipidemia    borderline    Mitral valve prolapse 1984   Pericardial effusion    Thyroid disease 2015   hypothyroidism   Vertigo     Past Surgical History:  Procedure Laterality Date   BACK SURGERY  2008   BREAST BIOPSY  1998   left - benign   CARPECTOMY HAND Right 06/22/2016   COLONOSCOPY N/A 01/17/2015   Procedure: COLONOSCOPY;  Surgeon: Malissa Hippo, MD;  Location: [facility];  Service: Endoscopy;  Laterality: N/A;  830 - moved to 4/21 @ 8:30   EYE SURGERY Left 12/01/2013   cataract   EYE SURGERY Right 12/15/2013   cataract   IR GENERIC HISTORICAL  08/19/2016   IR RADIOLOGIST EVAL & MGMT 08/19/2016 [facility]INTER[facility] RAD    SPINE SURGERY     TONSILLECTOMY  1965   TUBAL LIGATION  1983    Current Outpatient Medications  Medication Sig Dispense Refill   amLODipine (NORVASC) 2.5 MG tablet Take 1 tablet (2.5 mg total) by mouth daily. Please call [phone] to schedule overdue appointment. 90 tablet 0   Ascorbic Acid (VITAMIN C) 1000 MG tablet Take 1,000 mg by mouth daily.     Azelastine HCl 137 MCG/SPRAY SOLN Place 1 spray into both nostrils 2 (two) times daily.     Calcium Carbonate-Vit D-Min (CALCIUM 1200 PO) Take 1 tablet by mouth daily.     conjugated estrogens (PREMARIN) vaginal cream Applyt fingertip amouiunt daily for 1 week, then every 2 to 3 days , as needed for vaginal dryness/irritation 42.5 g 12   denosumab (PROLIA) 60 MG/ML SOSY injection Inject 60 mg into the skin every 6 (six) months. 1 mL 1   ipratropium (ATROVENT) 0.03 % nasal spray Place 1 spray into both nostrils 3 (three) times daily.     levothyroxine (SYNTHROID) 50 MCG tablet TAKE 1 TABLET BY MOUTH EVERY DAY IN THE MORNING 90 tablet 1   rosuvastatin (CRESTOR) 10 MG tablet TAKE 1 TABLET BY MOUTH EVERYDAY AT BEDTIME 90 tablet 0   No current facility-administered medications for this visit.    Allergies  Allergen Reactions   Aspirin Other (See Comments)    Bruising   Poison Oak Extract [Poison Oak Extract] Rash  Social History   Socioeconomic History   Marital status: Married    Spouse name: Iantha Fallen   Number of children: 3   Years of education: master's degree   Highest education level: Master's degree (e.g., MA, MS, MEng, MEd, MSW, MBA)  Occupational History   Occupation: Metallurgist: RETIRED  Tobacco Use   Smoking status: Former    Current packs/day: 0.00    Average packs/day: 1 pack/day for 4.0 years (4.0 ttl pk-yrs)    Types: Cigarettes    Start date: 12/15/1964    Quit date: 12/15/1968    Years since quitting: 55.0   Smokeless tobacco: Never  Vaping Use   Vaping status: Never Used   Substance and Sexual Activity   Alcohol use: Yes    Comment: socially    Drug use: No   Sexual activity: Yes  Other Topics Concern   Not on file  Social History Narrative   Not on file   Social Drivers of Health   Financial Resource Strain: Low Risk  (07/14/2023)   Overall Financial Resource Strain (CARDIA)    Difficulty of Paying Living Expenses: Not hard at all  Food Insecurity: No Food Insecurity (07/14/2023)   Hunger Vital Sign    Worried About Running Out of Food in the Last Year: Never true    Ran Out of Food in the Last Year: Never true  Transportation Needs: No Transportation Needs (07/14/2023)   PRAPARE - Administrator, Civil Service (Medical): No    Lack of Transportation (Non-Medical): No  Physical Activity: Sufficiently Active (07/14/2023)   Exercise Vital Sign    Days of Exercise per Week: 5 days    Minutes of Exercise per Session: 90 min  Stress: Stress Concern Present (07/14/2023)   [employer] of Occupational Health - Occupational Stress Questionnaire    Feeling of Stress : To some extent  Social Connections: Socially Integrated (07/14/2023)   Social Connection and Isolation Panel [NHANES]    Frequency of Communication with Friends and Family: More than three times a week    Frequency of Social Gatherings with Friends and Family: More than three times a week    Attends Religious Services: More than 4 times per year    Active Member of [employer] or Organizations: Yes    Attends Engineer, structural: More than 4 times per year    Marital Status: Married  Catering manager Violence: Not At Risk (07/14/2023)   Humiliation, Afraid, Rape, and Kick questionnaire    Fear of Current or Ex-Partner: No    Emotionally Abused: No    Physically Abused: No    Sexually Abused: No    Family History  Problem Relation Age of Onset   Heart attack Mother 32       used tobacco    Arthritis Mother        severe    Heart attack Father 38       used  tobacco    Alcohol abuse Father    Cancer Brother 65       prostate    Osteoporosis Sister        severe    Osteoarthritis Sister     Review of Systems:  As stated in the HPI and otherwise negative.   BP 132/70   Pulse 79   Ht 5\' 7"  (1.702 m)   Wt 59.3 kg   SpO2 95%   BMI 20.49 kg/m   Physical Examination: General:  Well developed, well nourished, NAD  HEENT: OP clear, mucus membranes moist  SKIN: warm, dry. No rashes. Neuro: No focal deficits  Musculoskeletal: Muscle strength 5/5 all ext  Psychiatric: Mood and affect normal  Neck: No JVD, no carotid bruits, no thyromegaly, no lymphadenopathy.  Lungs:Clear bilaterally, no wheezes, rhonci, crackles Cardiovascular: Regular rate and rhythm. No murmurs, gallops or rubs. Abdomen:Soft. Bowel sounds present. Non-tender.  Extremities: No lower extremity edema. Pulses are 2 + in the bilateral DP/PT.  EKG:  EKG is ordered today. The ekg ordered today demonstrates   Recent Labs: 06/23/2023: ALT 9; BUN 13; Creatinine, Ser 0.56; Hemoglobin 14.7; Platelets 271; Potassium 4.2; Sodium 146; TSH 2.170   Lipid Panel    Component Value Date/Time   CHOL 177 06/23/2023 0805   TRIG 71 06/23/2023 0805   HDL 68 06/23/2023 0805   CHOLHDL 2.6 06/23/2023 0805   CHOLHDL 3.3 04/08/2020 0854   VLDL 24 12/08/2016 0829   LDLCALC 96 06/23/2023 0805   LDLCALC 126 (H) 04/08/2020 0854     Wt Readings from Last 3 Encounters:  12/10/23 59.3 kg  11/02/23 60.8 kg  07/14/23 59 kg    Assessment and Plan:   1. Dizziness: No recent dizziness. Normal LV systolic function on echo September 2020.  No evidence of carotid artery disease.   2. HTN: BP is well controlled. Continue Norvasc.   Labs/ tests ordered today include:   Orders Placed This Encounter  Procedures   EKG 12-Lead   Disposition:   F/U with me in 24  months  Signed, Verne Carrow, MD 12/10/2023 4:55 PM    [facility] [address] [place], [place], [place]   [address] Phone: [phone]; Fax: [phone]

## 2023-12-16 ENCOUNTER — Telehealth: Payer: Self-pay | Admitting: "Endocrinology

## 2023-12-16 ENCOUNTER — Other Ambulatory Visit: Payer: Self-pay

## 2023-12-16 NOTE — Telephone Encounter (Signed)
 Pt needs call back about Prolia shot.  Says insurance won't send here anymore and that we need to check with her insurance.  She wants a call back to give [place] the information.  Her appt is 12/27/23.

## 2023-12-16 NOTE — Progress Notes (Signed)
 Patient reached out to [place] stating she was returning a voicemail that she received. Unsure of when message was left but we had received refills for Prolia. Ran test claim copayment [phone]. Informed patient who states that her insurance agent advised her it would be $200 through her providers office. Recommended patient reach out to provider office for benefits investigation for buy and bill. Cancelled Rx and disenrolling. Patient will contact us back if she needs to fill with [place] in future.

## 2023-12-16 NOTE — Telephone Encounter (Signed)
 Discussed with pt. She is going to call her insurance company for more details and call our office back.

## 2023-12-20 ENCOUNTER — Telehealth: Payer: Self-pay | Admitting: Family Medicine

## 2023-12-20 NOTE — Telephone Encounter (Signed)
 Please advise     Copied from CRM [id number]. Topic: General - Other >> Dec 20, 2023  4:22 PM Amanda Harmon E wrote: Reason for CRM: Pt wants to know if her PCP can order her prolia injection for her? Has questions for the clinic regarding this.

## 2023-12-20 NOTE — Telephone Encounter (Signed)
 I spoke with her this past Thursday. She stated she would call her insurance company regarding her Prolia and call the office back. Awaiting her call.

## 2023-12-20 NOTE — Telephone Encounter (Signed)
 Have you spoke back with her? She has an appt with [place] next week.

## 2023-12-22 ENCOUNTER — Other Ambulatory Visit: Payer: Self-pay | Admitting: Family Medicine

## 2023-12-22 DIAGNOSIS — E782 Mixed hyperlipidemia: Secondary | ICD-10-CM | POA: Diagnosis not present

## 2023-12-22 DIAGNOSIS — E785 Hyperlipidemia, unspecified: Secondary | ICD-10-CM | POA: Diagnosis not present

## 2023-12-22 DIAGNOSIS — E559 Vitamin D deficiency, unspecified: Secondary | ICD-10-CM | POA: Diagnosis not present

## 2023-12-22 DIAGNOSIS — M810 Age-related osteoporosis without current pathological fracture: Secondary | ICD-10-CM | POA: Diagnosis not present

## 2023-12-22 DIAGNOSIS — I10 Essential (primary) hypertension: Secondary | ICD-10-CM | POA: Diagnosis not present

## 2023-12-22 DIAGNOSIS — E039 Hypothyroidism, unspecified: Secondary | ICD-10-CM | POA: Diagnosis not present

## 2023-12-22 NOTE — Telephone Encounter (Signed)
 Patient following up wanting to know if we can order Prolia injection. Can someone please call patient back in regards. Thank you

## 2023-12-23 ENCOUNTER — Encounter: Payer: Self-pay | Admitting: Family Medicine

## 2023-12-23 ENCOUNTER — Telehealth: Payer: Self-pay | Admitting: Family Medicine

## 2023-12-23 ENCOUNTER — Other Ambulatory Visit: Payer: Self-pay

## 2023-12-23 ENCOUNTER — Other Ambulatory Visit (HOSPITAL_COMMUNITY): Payer: Self-pay

## 2023-12-23 LAB — COMPREHENSIVE METABOLIC PANEL WITH GFR
ALT: 11 IU/L (ref 0–32)
AST: 19 IU/L (ref 0–40)
Albumin: 4.2 g/dL (ref 3.8–4.8)
Alkaline Phosphatase: 66 IU/L (ref 44–121)
BUN/Creatinine Ratio: 20 (ref 12–28)
BUN: 12 mg/dL (ref 8–27)
Bilirubin Total: 0.4 mg/dL (ref 0.0–1.2)
CO2: 26 mmol/L (ref 20–29)
Calcium: 9.1 mg/dL (ref 8.7–10.3)
Chloride: 106 mmol/L (ref 96–106)
Creatinine, Ser: 0.61 mg/dL (ref 0.57–1.00)
Globulin, Total: 1.8 g/dL (ref 1.5–4.5)
Glucose: 90 mg/dL (ref 70–99)
Potassium: 5.1 mmol/L (ref 3.5–5.2)
Sodium: 144 mmol/L (ref 134–144)
Total Protein: 6 g/dL (ref 6.0–8.5)
eGFR: 92 mL/min/{1.73_m2} (ref >59)

## 2023-12-23 LAB — LIPID PANEL
Chol/HDL Ratio: 1.9 ratio (ref 0.0–4.4)
Chol/HDL Ratio: 1.9 ratio (ref 0.0–4.4)
Cholesterol, Total: 144 mg/dL (ref 100–199)
Cholesterol, Total: 150 mg/dL (ref 100–199)
HDL: 76 mg/dL (ref >39)
HDL: 77 mg/dL (ref >39)
LDL Chol Calc (NIH): 55 mg/dL (ref 0–99)
LDL Chol Calc (NIH): 60 mg/dL (ref 0–99)
Triglycerides: 64 mg/dL (ref 0–149)
Triglycerides: 66 mg/dL (ref 0–149)
VLDL Cholesterol Cal: 13 mg/dL (ref 5–40)
VLDL Cholesterol Cal: 13 mg/dL (ref 5–40)

## 2023-12-23 LAB — CMP14+EGFR
ALT: 9 IU/L (ref 0–32)
AST: 16 IU/L (ref 0–40)
Albumin: 4.4 g/dL (ref 3.8–4.8)
Alkaline Phosphatase: 69 IU/L (ref 44–121)
BUN/Creatinine Ratio: 18 (ref 12–28)
BUN: 12 mg/dL (ref 8–27)
Bilirubin Total: 0.5 mg/dL (ref 0.0–1.2)
CO2: 25 mmol/L (ref 20–29)
Calcium: 9.4 mg/dL (ref 8.7–10.3)
Chloride: 105 mmol/L (ref 96–106)
Creatinine, Ser: 0.68 mg/dL (ref 0.57–1.00)
Globulin, Total: 1.7 g/dL (ref 1.5–4.5)
Glucose: 94 mg/dL (ref 70–99)
Potassium: 4.9 mmol/L (ref 3.5–5.2)
Sodium: 144 mmol/L (ref 134–144)
Total Protein: 6.1 g/dL (ref 6.0–8.5)
eGFR: 90 mL/min/{1.73_m2} (ref >59)

## 2023-12-23 LAB — CBC
Hematocrit: 43.4 % (ref 34.0–46.6)
Hemoglobin: 14.4 g/dL (ref 11.1–15.9)
MCH: 30.3 pg (ref 26.6–33.0)
MCHC: 33.2 g/dL (ref 31.5–35.7)
MCV: 91 fL (ref 79–97)
Platelets: 216 10*3/uL (ref 150–450)
RBC: 4.76 x10E6/uL (ref 3.77–5.28)
RDW: 12.7 % (ref 11.7–15.4)
WBC: 5.3 10*3/uL (ref 3.4–10.8)

## 2023-12-23 LAB — TSH: TSH: 2.14 u[IU]/mL (ref 0.450–4.500)

## 2023-12-23 LAB — T4, FREE: Free T4: 1.36 ng/dL (ref 0.82–1.77)

## 2023-12-23 LAB — VITAMIN D 25 HYDROXY (VIT D DEFICIENCY, FRACTURES)
Vit D, 25-Hydroxy: 37 ng/mL (ref 30.0–100.0)
Vit D, 25-Hydroxy: 38.9 ng/mL (ref 30.0–100.0)

## 2023-12-23 NOTE — Telephone Encounter (Signed)
 Copied from CRM [id number]. Topic: General - Other >> Dec 20, 2023  4:22 PM Dondra Prader E wrote: Reason for CRM: Pt wants to know if her PCP can order her prolia injection for her? Has questions for the clinic regarding this. >> Dec 23, 2023 10:28 AM Emylou G wrote: Patient called.. checking to see if we order the Prolia for her since her prior clinic doesn't anymore and she gets this every 6 months.  Her#[phone]

## 2023-12-23 NOTE — Telephone Encounter (Signed)
 Duplicate message. See previous message and provider response

## 2023-12-23 NOTE — Progress Notes (Signed)
 Specialty Pharmacy Refill Coordination Note  Amanda Harmon is a 78 y.o. female contacted today regarding refills of specialty medication(s) Denosumab (Prolia)   Patient requested Courier to Provider Office   Delivery date: 12/24/23   Verified address: [facility]   Medication will be filled on 12/23/23.

## 2023-12-24 NOTE — Telephone Encounter (Signed)
 Spoke w/ patient. She stated she has picked up the Prolia and will be having her injection on Monday.

## 2023-12-27 ENCOUNTER — Encounter: Payer: Self-pay | Admitting: "Endocrinology

## 2023-12-27 ENCOUNTER — Ambulatory Visit: Payer: PPO | Admitting: "Endocrinology

## 2023-12-27 VITALS — BP 122/68 | HR 60 | Ht 67.0 in | Wt 131.0 lb

## 2023-12-27 DIAGNOSIS — E039 Hypothyroidism, unspecified: Secondary | ICD-10-CM

## 2023-12-27 DIAGNOSIS — E782 Mixed hyperlipidemia: Secondary | ICD-10-CM

## 2023-12-27 DIAGNOSIS — M810 Age-related osteoporosis without current pathological fracture: Secondary | ICD-10-CM | POA: Diagnosis not present

## 2023-12-27 MED ORDER — DENOSUMAB 60 MG/ML ~~LOC~~ SOSY
60.0000 mg | PREFILLED_SYRINGE | Freq: Once | SUBCUTANEOUS | Status: AC
Start: 2023-12-27 — End: 2023-12-27
  Administered 2023-12-27: 60 mg via SUBCUTANEOUS

## 2023-12-27 NOTE — Progress Notes (Signed)
 Pt seen in office today for follow up with Dr. Fransico Him and continuation of Prolia treatment. Pt provided prolia, Lot # [id number], Exp. Date 04-27-2026. Prolia 60mg  injected SQ in upper L arm without difficulty. Pt waited 15 minutes post injection, no adverse reactions noted.

## 2023-12-27 NOTE — Progress Notes (Signed)
 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    Cystitis    Hyperlipidemia    borderline    Mitral valve prolapse 1984   Pericardial effusion    Thyroid disease 2015   hypothyroidism   Vertigo    Past Surgical History:  Procedure Laterality Date   BACK SURGERY  2008   BREAST BIOPSY  1998   left - benign   CARPECTOMY HAND Right 06/22/2016   COLONOSCOPY N/A 01/17/2015   Procedure: COLONOSCOPY;  Surgeon: Malissa Hippo, MD;  Location: [facility];  Service: Endoscopy;  Laterality: N/A;  830 - moved to 4/21 @ 8:30   EYE SURGERY Left 12/01/2013   cataract   EYE SURGERY Right 12/15/2013   cataract   IR GENERIC HISTORICAL  08/19/2016   IR RADIOLOGIST EVAL & MGMT 08/19/2016 [facility]   SPINE SURGERY     TONSILLECTOMY  1965   TUBAL LIGATION  1983   Social History   Socioeconomic History   Marital status: Married    Spouse name: Iantha Fallen   Number of children: 3   Years of education: master's degree   Highest education level: Master's degree (e.g., MA, MS, MEng, MEd, MSW, MBA)  Occupational History   Occupation: Metallurgist: RETIRED  Tobacco Use   Smoking status: Former    Current packs/day: 0.00    Average packs/day: 1 pack/day for 4.0 years (4.0 ttl pk-yrs)    Types: Cigarettes    Start date: 12/15/1964    Quit date: 12/15/1968    Years since quitting: 55.0   Smokeless tobacco: Never  Vaping Use   Vaping status: Never Used  Substance and Sexual Activity   Alcohol use: Yes    Comment: socially    Drug use: No   Sexual activity: Yes  Other Topics Concern   Not on file  Social History Narrative   Not on file   Social Drivers of Health   Financial Resource Strain: Low Risk  (07/14/2023)   Overall Financial Resource Strain (CARDIA)     Difficulty of Paying Living Expenses: Not hard at all  Food Insecurity: No Food Insecurity (07/14/2023)   Hunger Vital Sign    Worried About Running Out of Food in the Last Year: Never true    Ran Out of Food in the Last Year: Never true  Transportation Needs: No Transportation Needs (07/14/2023)   PRAPARE - Administrator, Civil Service (Medical): No    Lack of Transportation (Non-Medical): No  Physical Activity: Sufficiently Active (07/14/2023)   Exercise Vital Sign    Days of Exercise per Week: 5 days    Minutes of Exercise per Session: 90 min  Stress: Stress Concern Present (07/14/2023)   [employer] of Occupational Health - Occupational Stress Questionnaire    Feeling of Stress : To some extent  Social Connections: Socially Integrated (07/14/2023)   Social Connection and Isolation Panel [NHANES]    Frequency of Communication with Friends and Family: More than three times a week    Frequency of Social Gatherings with Friends and Family: More than three times a week    Attends Religious Services: More than 4 times per year    Active Member of [employer] or Organizations: Yes  Attends Banker Meetings: More than 4 times per year    Marital Status: Married   Outpatient Encounter Medications as of 12/27/2023  Medication Sig   amLODipine (NORVASC) 2.5 MG tablet Take 1 tablet (2.5 mg total) by mouth daily. Please call [phone] to schedule overdue appointment.   Ascorbic Acid (VITAMIN C) 1000 MG tablet Take 1,000 mg by mouth daily.   Azelastine HCl 137 MCG/SPRAY SOLN Place 1 spray into both nostrils 2 (two) times daily.   Calcium Carbonate-Vit D-Min (CALCIUM 1200 PO) Take 1 tablet by mouth daily.   conjugated estrogens (PREMARIN) vaginal cream Applyt fingertip amouiunt daily for 1 week, then every 2 to 3 days , as needed for vaginal dryness/irritation   denosumab (PROLIA) 60 MG/ML SOSY injection Inject 60 mg into the skin every 6 (six) months.   ipratropium  (ATROVENT) 0.03 % nasal spray Place 1 spray into both nostrils 3 (three) times daily.   levothyroxine (SYNTHROID) 50 MCG tablet TAKE 1 TABLET BY MOUTH EVERY DAY IN THE MORNING   rosuvastatin (CRESTOR) 10 MG tablet TAKE 1 TABLET BY MOUTH EVERYDAY AT BEDTIME   [EXPIRED] denosumab (PROLIA) injection 60 mg    No facility-administered encounter medications on file as of 12/27/2023.   ALLERGIES: Allergies  Allergen Reactions   Aspirin Other (See Comments)    Bruising   Poison Oak Extract [Poison Oak Extract] Rash   VACCINATION STATUS: Immunization History  Administered Date(s) Administered   Fluad Quad(high Dose 65+) 07/19/2020, 08/05/2022   Influenza Split 06/17/2014, 07/23/2017   Influenza, High Dose Seasonal PF 07/05/2018, 06/12/2019   Influenza,inj,Quad PF,6+ Mos 07/20/2013, 07/01/2015, 07/15/2016   Influenza-Unspecified 07/22/2021, 07/12/2023   Moderna Covid-19 Vaccine Bivalent Booster 34yrs & up 07/03/2022, 06/14/2023   Moderna Sars-Covid-2 Vaccination 11/02/2019, 12/05/2019, 07/23/2020, 04/04/2021, 08/11/2021   Pneumococcal Conjugate-13 10/30/2014, 08/23/2017   Pneumococcal Polysaccharide-23 03/23/2012   Rsv, Bivalent, Protein Subunit Rsvpref,pf Verdis Frederickson) 07/20/2022   Tdap 12/09/2012, 04/05/2023   Zoster Recombinant(Shingrix) 08/02/2019, 02/09/2020   Zoster, Live 02/24/2007    HPI  78 -year-old female patient with medical history as above.  She is returning to follow-up for osteoporosis, hypothyroidism, hyperlipidemia.  She has no interval falls/fractures.  She has no new complaints today.  She has been on alendronate for 6+ years.    For hypothyroidism, she remains on levothyroxine 50 mcg p.o. daily before breakfast.  She continues to tolerate very well.  Her previsit thyroid function tests are consistent with appropriate replacement.  She also has postmenopausal osteoporosis for which she has received treatment with alendronate 70 mg p.o. weekly for 6 years.  Due to insufficient  response, she was switched to Prolia last year, continued to tolerate.  She is ready for her next dose today.  She has no interval falls nor fractures.  Appropriate replacement.    .  She has history of hand fracture 2 years ago.    - She has history of compression fracture of first lumbar vertebra. No recent height change. She denies hx of goiter. Her last menstrual period was at age 89. she has 3 grown children. she is a non-smoker.   she never took steroids on chronic basis. she denies cold/heat intolerance. she is active , exercises regularly. She was started on low-dose Crestor for dyslipidemia, which she has responded with controlled LDL at 66, overall improving from 126.  Review of Systems Limited as above.  Objective:    BP 122/68   Pulse 60   Ht 5\' 7"  (1.702 m)   Wt  131 lb (59.4 kg)   BMI 20.52 kg/m   Wt Readings from Last 3 Encounters:  12/27/23 131 lb (59.4 kg)  12/10/23 130 lb 12.8 oz (59.3 kg)  11/02/23 134 lb (60.8 kg)    Physical Exam   Exam is significant for tenderness to pressure on trochanteric line bilaterally.  No gait deficit.  CMP     Component Value Date/Time   NA 144 12/22/2023 0823   K 4.9 12/22/2023 0823   CL 105 12/22/2023 0823   CO2 25 12/22/2023 0823   GLUCOSE 94 12/22/2023 0823   GLUCOSE 93 04/08/2020 0846   BUN 12 12/22/2023 0823   CREATININE 0.68 12/22/2023 0823   CREATININE 0.68 04/08/2020 0846   CALCIUM 9.4 12/22/2023 0823   PROT 6.1 12/22/2023 0823   ALBUMIN 4.4 12/22/2023 0823   AST 16 12/22/2023 0823   ALT 9 12/22/2023 0823   ALKPHOS 69 12/22/2023 0823   BILITOT 0.5 12/22/2023 0823   GFRNONAA 90 10/03/2019 0932   GFRAA 104 10/03/2019 0932    Latest Reference Range & Units 12/15/22 08:57  TSH 0.450 - 4.500 uIU/mL 1.530  T4,Free(Direct) 0.82 - 1.77 ng/dL 5.78    Lipid Panel     Component Value Date/Time   CHOL 150 12/22/2023 0823   TRIG 66 12/22/2023 0823   HDL 77 12/22/2023 0823   CHOLHDL 1.9 12/22/2023 0823    CHOLHDL 3.3 04/08/2020 0854   VLDL 24 12/08/2016 0829   LDLCALC 60 12/22/2023 0823   LDLCALC 126 (H) 04/08/2020 0854   June 28, 2018 labs TSH 2.9 7, free T4 1.2  DualFemur Neck Right 12/16/2022 76.3 Osteopenia -2.0 0.756 g/cm2 -4.7% Yes DualFemur Neck Right 12/11/2020 74.3 Osteopenia -1.8 0.793 g/cm2 3.3% - DualFemur Neck Right 11/03/2018 72.2 Osteopenia -1.9 0.768 g/cm2 -3.6% -    DualFemur Total Mean 12/16/2022 76.3 Osteopenia -1.6 0.806 g/cm2 -0.2% - DualFemur Total Mean 12/11/2020 74.3 Osteopenia -1.6 0.808 g/cm2 -0.6% - DualFemur Total Mean 11/03/2018 72.2 Osteopenia -1.5 0.813 g/cm2 1.9% -   Left Forearm Radius 33% 12/16/2022 76.3 Osteopenia -2.1 0.560 g/cm2 - - ASSESSMENT: The BMD measured at Forearm Radius 33% is 0.560 g/cm2 with a T-score of -2.1. This patient is considered osteopenic according to World Health Organization [facility]) criteria. The scan quality is good. Compared with the prior study on 12/11/20, the BMD of the total mean shows no statistically significant change. Lumbar spine was excluded due to advanced degenerative changes. Patient is not a candidate for FRAX assessment due to fosamax. .     Assessment & Plan:    1. Hypothyroidism Her previsit thyroid function tests are consistent with appropriate replacement.  She is advised to continue levothyroxine 50 mcg p.o. daily before breakfast.    - We discussed about the correct intake of her thyroid hormone, on empty stomach at fasting, with water, separated by at least 30 minutes from breakfast and other medications,  and separated by more than 4 hours from calcium, iron, multivitamins, acid reflux medications (PPIs). -Patient is made aware of the fact that thyroid hormone replacement is needed for life, dose to be adjusted by periodic monitoring of thyroid function tests.   2.  Osteoporosis  -She was previously treated with alendronate for 6 years.  Her last bone density was not satisfactory and hence  she was switched to Prolia 60 mg subcutaneously every 6 months.  She has tolerated her last 2 prolia treatments, she  is ready for next dose today and every 6 months.  The importance  of continued treatment with Prolia to avoid withdrawal fracture was discussed with her. -She is advised to continue vitamin D3  2,000 units daily . -She will continue with calcium carbonate 1200 mg daily with lunch. She will be seen in 6 months for follow-up.  She will also be considered for bone density in March 2026.     3. Hyperlipidemia:  - She has family history of hyperlipidemia and coronary artery disease.  Her previsit lipid panel shows LDL at 60 significantly improving . She is tolerating and advised to continue Crestor  10 mg p.o. nightly.  Side effects and precautions discussed with her.  As an alternative, whole food plant-based diet was discussed in detail and the packet of lifestyle medicine was given to her.  - I advised patient to maintain close follow up with Kerri Perches, MD for primary care needs.    I spent  25 minutes in the care of the patient today including review of labs from Thyroid Function, CMP, and other relevant labs ; imaging/biopsy records (current and previous including abstractions from other facilities); face-to-face time discussing  her lab results and symptoms, medications doses, her options of short and long term treatment based on the latest standards of care / guidelines;   and documenting the encounter.  Amanda Harmon  participated in the discussions, expressed understanding, and voiced agreement with the above plans.  All questions were answered to her satisfaction. she is encouraged to contact clinic should she have any questions or concerns prior to her return visit.    Follow up plan: Return in about 6 months (around 06/27/2024) for Fasting Labs  in AM B4 8.  Marquis Lunch, MD Phone: [phone]  Fax: [phone]   12/27/2023, 5:37 PM

## 2024-01-07 ENCOUNTER — Other Ambulatory Visit: Payer: Self-pay | Admitting: Physician Assistant

## 2024-01-23 ENCOUNTER — Other Ambulatory Visit: Payer: Self-pay | Admitting: "Endocrinology

## 2024-03-21 DIAGNOSIS — L84 Corns and callosities: Secondary | ICD-10-CM | POA: Diagnosis not present

## 2024-03-21 DIAGNOSIS — M79671 Pain in right foot: Secondary | ICD-10-CM | POA: Diagnosis not present

## 2024-03-21 DIAGNOSIS — M79672 Pain in left foot: Secondary | ICD-10-CM | POA: Diagnosis not present

## 2024-03-21 DIAGNOSIS — Q828 Other specified congenital malformations of skin: Secondary | ICD-10-CM | POA: Diagnosis not present

## 2024-04-22 ENCOUNTER — Other Ambulatory Visit: Payer: Self-pay | Admitting: "Endocrinology

## 2024-05-09 ENCOUNTER — Ambulatory Visit: Payer: PPO | Admitting: Dermatology

## 2024-05-18 ENCOUNTER — Ambulatory Visit (INDEPENDENT_AMBULATORY_CARE_PROVIDER_SITE_OTHER): Admitting: Family Medicine

## 2024-05-18 ENCOUNTER — Other Ambulatory Visit: Payer: Self-pay

## 2024-05-18 ENCOUNTER — Encounter: Payer: Self-pay | Admitting: Family Medicine

## 2024-05-18 VITALS — BP 132/72 | HR 66 | Resp 16 | Ht 67.0 in | Wt 131.0 lb

## 2024-05-18 DIAGNOSIS — Z0000 Encounter for general adult medical examination without abnormal findings: Secondary | ICD-10-CM

## 2024-05-18 DIAGNOSIS — R053 Chronic cough: Secondary | ICD-10-CM | POA: Insufficient documentation

## 2024-05-18 MED ORDER — MONTELUKAST SODIUM 10 MG PO TABS: 10.0000 mg | ORAL_TABLET | Freq: Every day | ORAL | 3 refills | Status: DC

## 2024-05-18 NOTE — Patient Instructions (Addendum)
 F/U in 6 months  You need influenza and covid vaccines  You are being referred to Pulmonary Dr Darlean for evaluation of chronic cough  I do recommend removal of the cyst on your back by Surgery, may discuss this with your Dermatologist and let me know if you decide on the referral  If queasy feeling persists, pls let me know  Work on anxiety with relaxation techniques and more regular exercise and continue the good health habits that you have  New for allergies is montelukast  take every evening or reduce as preferred to 3 to 4 times per week if tha t is also / as effective  Best for the rest of the year!  Thanks for choosing [facility], we consider it a privelige to serve you.

## 2024-05-18 NOTE — Assessment & Plan Note (Signed)
 Annual exam as documented. . Immunization and cancer screening needs are specifically addressed at this visit.

## 2024-05-18 NOTE — Assessment & Plan Note (Signed)
 1 year h/o  daily cough Has seen ENT, has taken allergy meds sporadically as well as refluyx meds, does not feel anything has worked No s/s of infection Will satart daily singulair  in the interim

## 2024-05-18 NOTE — Progress Notes (Signed)
    Amanda Harmon     MRN: [id number]      DOB: [birth year]  Chief Complaint  Patient presents with   Annual Exam    cpe    HPI: Patient is in for annual physical exam. C/o cough mainly non productive x 1 year, daily C/o queasy feeling on awaking x approx 4 to 6 weeks C/o poor sleep and increased anxiety in past several months. C/o bothersome skin tags under breast and in left armpit, also has pigmented lesions which are followed by Dermatology C/o spider veins worsening but responsive to compression hose Immunization is reviewed , and  needs to be updated   PE: BP 132/72   Pulse 66   Resp 16   Ht 5' 7 (1.702 m)   Wt 131 lb (59.4 kg)   SpO2 96%   BMI 20.52 kg/m   Pleasant  female, alert and oriented x 3, in no cardio-pulmonary distress. Afebrile. HEENT No facial trauma or asymetry. Sinuses non tender.  Extra occullar muscles intact.. External ears normal, . Neck: supple, no adenopathy,JVD or thyromegaly.No bruits.  Chest: Clear to ascultation bilaterally.No crackles or wheezes. Non tender to palpation  Cardiovascular system; Heart sounds normal,  S1 and  S2 ,no S3.  No murmur, or thrill. Apical beat not displaced Peripheral pulses normal.  Abdomen: Soft, non tender, no organomegaly or masses. No bruits. Bowel sounds normal. No guarding, tenderness or rebound.   Musculoskeletal exam: Full ROM of spine, hips , shoulders and knees. No deformity ,swelling or crepitus noted. No muscle wasting or atrophy.   Neurologic: Cranial nerves 2 to 12 intact. Power, tone s normal throughout. No disturbance in gait. No tremor.  Skin: Intact, no ulceration, erythema , scaling or rash noted. Hyperpigmented macular  skin lesions irregular borders and pigmentation on back, skin tags and cyst on right posterior chest , with necrotic plug   Psych; Anxious and admittedly so, not depressed t. Judgement and concentration normal   Assessment & Plan:  Chronic cough 1  year h/o  daily cough Has seen ENT, has taken allergy meds sporadically as well as refluyx meds, does not feel anything has worked No s/s of infection Will satart daily singulair  in the interim   Annual physical exam Annual exam as documented. . Immunization and cancer screening needs are specifically addressed at this visit.

## 2024-06-07 ENCOUNTER — Ambulatory Visit (HOSPITAL_COMMUNITY)
Admission: RE | Admit: 2024-06-07 | Discharge: 2024-06-07 | Disposition: A | Discharge status: Home / Self Care | Source: Ambulatory Visit | Attending: Family Medicine | Admitting: Family Medicine

## 2024-06-07 ENCOUNTER — Encounter (HOSPITAL_COMMUNITY): Payer: Self-pay

## 2024-06-07 DIAGNOSIS — Z1231 Encounter for screening mammogram for malignant neoplasm of breast: Secondary | ICD-10-CM | POA: Insufficient documentation

## 2024-06-08 ENCOUNTER — Encounter: Payer: HMO | Admitting: Family Medicine

## 2024-06-08 ENCOUNTER — Ambulatory Visit (HOSPITAL_COMMUNITY): Payer: HMO

## 2024-06-09 ENCOUNTER — Other Ambulatory Visit (HOSPITAL_COMMUNITY): Payer: Self-pay

## 2024-06-15 ENCOUNTER — Other Ambulatory Visit: Payer: Self-pay

## 2024-06-15 ENCOUNTER — Telehealth: Payer: Self-pay

## 2024-06-15 ENCOUNTER — Other Ambulatory Visit: Payer: Self-pay | Admitting: Pharmacy Technician

## 2024-06-15 DIAGNOSIS — M810 Age-related osteoporosis without current pathological fracture: Secondary | ICD-10-CM

## 2024-06-15 MED ORDER — JUBBONTI 60 MG/ML ~~LOC~~ SOSY
60.0000 mg | PREFILLED_SYRINGE | SUBCUTANEOUS | 1 refills | Status: AC
  Filled 2024-06-15: qty 1, fill #0
  Filled 2024-06-20: qty 1, 180d supply, fill #0

## 2024-06-15 NOTE — Telephone Encounter (Signed)
 Rx for Jubbonti  60mg  SQ every sent to [facility] to be delivered to [facility].

## 2024-06-15 NOTE — Telephone Encounter (Signed)
 Patient's insurance plan no longer covers Prolia . Plan prefers Jubbonti . Test claim goes through without a PA for $594. Please send a new prescription to [facility].

## 2024-06-16 ENCOUNTER — Other Ambulatory Visit: Payer: Self-pay | Admitting: "Endocrinology

## 2024-06-18 NOTE — Progress Notes (Unsigned)
 Amanda Harmon, female    DOB: January 17, 1946    MRN: [id number]   Brief patient profile:  78  yowf  minimal smoking hx quit age 78 p episode bronchitis  referred to pulmonary clinic in [place]  06/22/2024 by Dr Antonetta  for cough fall 2024 in setting of year round watery  rhinitis since around 2015 with ENT in [facility]  nothing helped   Pt not previously seen by PCCM service.    History of Present Illness  06/22/2024  Pulmonary/ 1st office eval/ Amanda Harmon / [facility]  Chief Complaint  Patient presents with   Consult    Cough x one year   Dyspnea:  no problem with limiting doe Cough: mostly dry with daily  onset w/in an hour of rising typically p bfast with sporadic and immediately at hs and then eventually  sleeps fine assoc with constant nose running  Sleep: bed flat level / one pillow  SABA use: none  02: none     No obvious day to day or daytime pattern/variability or assoc excess/ purulent sputum or mucus plugs or hemoptysis or cp or chest tightness, subjective wheeze or overt  hb symptoms.    Also denies any obvious fluctuation of symptoms with weather or environmental changes or other aggravating or alleviating factors except as outlined above   No unusual exposure hx or h/o childhood pna/ asthma or knowledge of premature birth.  Current Allergies, Complete Past Medical History, Past Surgical History, Family History, and Social History were reviewed in [employer] record.  ROS  The following are not active complaints unless bolded Hoarseness, sore throat, dysphagia, dental problems, itching, sneezing,  nasal congestion or discharge of excess mucus or purulent secretions, ear ache,   fever, chills, sweats, unintended wt loss or wt gain, classically pleuritic or exertional cp,  orthopnea pnd or arm/hand swelling  or leg swelling, presyncope, palpitations, abdominal pain, anorexia, nausea, vomiting, diarrhea  or change in bowel habits or change in  bladder habits, change in stools or change in urine, dysuria, hematuria,  rash, arthralgias, visual complaints, headache, numbness, weakness or ataxia or problems with walking or coordination,  change in mood or  memory.         Next step add back acid suppression with PPI ac and Pepcid hs if not improving    Outpatient Medications Prior to Visit  Medication Sig Dispense Refill   amLODipine  (NORVASC ) 2.5 MG tablet TAKE 1 TABLET BY MOUTH DAILY. PLEASE CALL [phone] TO SCHEDULE OVERDUE APPOINTMENT. 90 tablet 3   Ascorbic Acid (VITAMIN C) 1000 MG tablet Take 1,000 mg by mouth daily.     Calcium  Carbonate-Vit D-Min (CALCIUM  1200 PO) Take 1 tablet by mouth daily.     conjugated estrogens  (PREMARIN ) vaginal cream Applyt fingertip amouiunt daily for 1 week, then every 2 to 3 days , as needed for vaginal dryness/irritation 42.5 g 12   denosumab -bbdz (JUBBONTI ) 60 MG/ML SOSY Inject 60 mg into the skin every 6 (six) months. 1 mL 1   levothyroxine  (SYNTHROID ) 50 MCG tablet TAKE 1 TABLET BY MOUTH EVERY DAY IN THE MORNING 90 tablet 1   montelukast  (SINGULAIR ) 10 MG tablet Take 1 tablet (10 mg total) by mouth at bedtime. 30 tablet 3   rosuvastatin  (CRESTOR ) 10 MG tablet TAKE 1 TABLET BY MOUTH EVERYDAY AT BEDTIME 90 tablet 0   ipratropium (ATROVENT ) 0.03 % nasal spray Place 1 spray into both nostrils 3 (three) times daily.     Azelastine HCl  137 MCG/SPRAY SOLN Place 1 spray into both nostrils 2 (two) times daily.     No facility-administered medications prior to visit.    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    Cystitis    Hyperlipidemia    borderline    Mitral valve prolapse 1984   Pericardial effusion    Thyroid  disease 2015   hypothyroidism   Vertigo       Objective:     BP 139/73   Pulse 74   Temp 97.8 F (36.6 C) (Temporal)   Ht 5' 7 (1.702 m)   Wt 131 lb 3.2 oz (59.5 kg)   SpO2 99% Comment: room air  BMI 20.55 kg/m   SpO2: 99 %  (room air)    Pleasant amb wf nad   HEENT : Oropharynx  clear, no cobblestoning      Nasal turbinates min watery secretions/ edema bilaterally    NECK :  without  apparent JVD/ palpable Nodes/TM    LUNGS: no acc muscle use,  Nl contour chest which is clear to A and P bilaterally without cough reproducible on insp or exp maneuvers   CV:  RRR  no s3 or murmur or increase in P2, and no edema   ABD:  soft and nontender   MS:  Gait nl   ext warm without deformities Or obvious joint restrictions  calf tenderness, cyanosis or clubbing    SKIN: warm and dry without lesions    NEURO:  alert, approp, nl sensorium with  no motor or cerebellar deficits apparent.    CXR PA and Lateral:   06/22/2024 :    I personally reviewed images and impression is as follows:     No acute findings     Assessment   Assessment & Plan Upper airway cough syndrome Onset fall 2024 On a background of CR since 2015  - Allergy screen 06/22/2024 >  Eos 0. /  IgE   - - The proper method of use, as well as anticipated side effects, of nasal  inhaler were discussed and demonstrated to the patient using teach back method >>> rec rechallenge with atrovent  0.3 bid  - 06/22/2024 rec gerd diet/ hard rock candy and tessalon  prn and 1st gen H1 blockers per guidelines  if tolerated starting with hs dosing  Upper airway cough syndrome (previously labeled PNDS),  is so named because it's frequently impossible to sort out how much is  CR/sinusitis with freq throat clearing (which can be related to primary GERD)   vs  causing  secondary ( extra esophageal)  GERD from wide swings in gastric pressure that occur with throat clearing, often  promoting self use of mint and menthol lozenges that reduce the lower esophageal sphincter tone and exacerbate the problem further in a cyclical fashion.   These are the same pts (now being labeled as having irritable larynx syndrome by some cough centers) who not infrequently have a history of  having failed to tolerate ace inhibitors,  dry powder inhalers or biphosphonates or report having atypical/extraesophageal reflux symptoms from LPR (globus, throat clearing)  that don't respond to standard doses of PPI  and are easily confused as having aecopd or asthma flares by even experienced allergists/ pulmonologists (myself included).   Of the three most common causes of  Sub-acute / recurrent or chronic cough, only one (GERD)  can actually contribute to/ trigger  the other two (asthma and post nasal drip syndrome)  and perpetuate the cylce of cough.  While not intuitively obvious, many patients with chronic low grade reflux do not cough until there is a primary insult that disturbs the protective epithelial barrier and exposes sensitive nerve endings.   This is typically viral but can due to PNDS and  either may apply here.     >>> The point is that once this occurs, it is difficult to eliminate the cycle  using anything but a maximally effective  diet to address non acid GERD and control / eliminate the cough itself for at least 3 days with tessalon  while elimiting at least the noct cough with 1st gen H1 blockers per guidelines           Each maintenance medication was reviewed in detail including emphasizing most importantly the difference between maintenance and prns and under what circumstances the prns are to be triggered using an action plan format where appropriate.  Total time for H and P, chart review, counseling, reviewing nasal  device(s) and generating customized AVS unique to this office visit / same day charting = 60 min new pt eval  for refractory respiratory  symptoms of uncertain etiology          AVS  Patient Instructions  Please remember to go to the lab department   for your tests - we will call you with the results when they are available.      Please remember to go to the  x-ray department  @  [facility] for your tests - we will call you with the results  when they are available     Atrovent  nasal spray one twice each nostril each daily until better for a week then as needed   For cough > tessalon  200 mg three times daily as needed  Stop montelukast  (singulair )   GERD (REFLUX)  is an extremely common cause of respiratory symptoms just like yours , many times with no obvious heartburn at all.    It can be treated with medication, but also with lifestyle changes including elevation of the head of your bed (ideally with 6 -8inch blocks under the headboard of your bed),  Smoking cessation, avoidance of late meals, excessive alcohol, and avoid fatty foods, chocolate, peppermint, colas, red wine, and acidic juices such as orange juice.  NO MINT OR MENTHOL PRODUCTS SO NO COUGH DROPS  USE SUGARLESS CANDY INSTEAD (Jolley ranchers or Stover's or Life Savers) or even ice chips will also do - the key is to swallow to prevent all throat clearing. NO OIL BASED VITAMINS - use powdered substitutes.  Avoid fish oil when coughing.   For drainage / throat tickle try take CHLORPHENIRAMINE  4 mg  (Allergy Relief 4mg   at [facility] should be easiest to find in the blue box usually on bottom shelf)  take one every 4 hours as needed - extremely effective and inexpensive over the counter- may cause drowsiness so start with just a dose or two an hour before bedtime and see how you tolerate it before trying in daytime.   Follow up is as needed          Ozell America, MD 06/22/2024

## 2024-06-20 ENCOUNTER — Other Ambulatory Visit: Payer: Self-pay

## 2024-06-21 ENCOUNTER — Other Ambulatory Visit: Payer: Self-pay

## 2024-06-21 NOTE — Progress Notes (Signed)
 Specialty Pharmacy Refill Coordination Note  Rhoda ORN Molesky is a 78 y.o. female contacted today regarding refills of specialty medication(s) Denosumab -bbdz (Jubbonti )   Patient requested Courier to Provider Office   Delivery date: 06/26/24   Verified address: [facility]-[address]   Medication will be filled on 06/23/24.

## 2024-06-22 ENCOUNTER — Ambulatory Visit (HOSPITAL_COMMUNITY)
Admission: RE | Admit: 2024-06-22 | Discharge: 2024-06-22 | Disposition: A | Discharge status: Home / Self Care | Source: Ambulatory Visit | Attending: Internal Medicine | Admitting: Internal Medicine

## 2024-06-22 ENCOUNTER — Ambulatory Visit: Admitting: Internal Medicine

## 2024-06-22 ENCOUNTER — Encounter: Payer: Self-pay | Admitting: Internal Medicine

## 2024-06-22 ENCOUNTER — Other Ambulatory Visit: Payer: Self-pay

## 2024-06-22 VITALS — BP 139/73 | HR 74 | Temp 97.8°F | Ht 67.0 in | Wt 131.2 lb

## 2024-06-22 DIAGNOSIS — M810 Age-related osteoporosis without current pathological fracture: Secondary | ICD-10-CM | POA: Diagnosis not present

## 2024-06-22 DIAGNOSIS — R059 Cough, unspecified: Secondary | ICD-10-CM | POA: Diagnosis not present

## 2024-06-22 DIAGNOSIS — R058 Other specified cough: Secondary | ICD-10-CM | POA: Diagnosis not present

## 2024-06-22 DIAGNOSIS — E039 Hypothyroidism, unspecified: Secondary | ICD-10-CM | POA: Diagnosis not present

## 2024-06-22 DIAGNOSIS — E782 Mixed hyperlipidemia: Secondary | ICD-10-CM | POA: Diagnosis not present

## 2024-06-22 MED ORDER — BENZONATATE 200 MG PO CAPS: 200.0000 mg | ORAL_CAPSULE | Freq: Three times a day (TID) | ORAL | 1 refills | Status: AC | PRN

## 2024-06-22 MED ORDER — IPRATROPIUM BROMIDE 0.03 % NA SOLN: 1.0000 | Freq: Two times a day (BID) | NASAL | 11 refills | Status: AC

## 2024-06-22 NOTE — Patient Instructions (Addendum)
 Please remember to go to the lab department   for your tests - we will call you with the results when they are available.      Please remember to go to the  x-ray department  @  [facility] for your tests - we will call you with the results when they are available     Atrovent  nasal spray one twice each nostril each daily until better for a week then as needed   For cough > tessalon  200 mg three times daily as needed  Stop montelukast  (singulair )   GERD (REFLUX)  is an extremely common cause of respiratory symptoms just like yours , many times with no obvious heartburn at all.    It can be treated with medication, but also with lifestyle changes including elevation of the head of your bed (ideally with 6 -8inch blocks under the headboard of your bed),  Smoking cessation, avoidance of late meals, excessive alcohol, and avoid fatty foods, chocolate, peppermint, colas, red wine, and acidic juices such as orange juice.  NO MINT OR MENTHOL PRODUCTS SO NO COUGH DROPS  USE SUGARLESS CANDY INSTEAD (Jolley ranchers or Stover's or Life Savers) or even ice chips will also do - the key is to swallow to prevent all throat clearing. NO OIL BASED VITAMINS - use powdered substitutes.  Avoid fish oil when coughing.   For drainage / throat tickle try take CHLORPHENIRAMINE  4 mg  (Allergy Relief 4mg   at [facility] should be easiest to find in the blue box usually on bottom shelf)  take one every 4 hours as needed - extremely effective and inexpensive over the counter- may cause drowsiness so start with just a dose or two an hour before bedtime and see how you tolerate it before trying in daytime.   Follow up is as needed

## 2024-06-22 NOTE — Assessment & Plan Note (Addendum)
 Onset fall 2024 On a background of CR since 2015  - Allergy screen 06/22/2024 >  Eos 0. /  IgE   - - The proper method of use, as well as anticipated side effects, of nasal  inhaler were discussed and demonstrated to the patient using teach back method >>> rec rechallenge with atrovent  0.3 bid  - 06/22/2024 rec gerd diet/ hard rock candy and tessalon  prn and 1st gen H1 blockers per guidelines  if tolerated starting with hs dosing  Upper airway cough syndrome (previously labeled PNDS),  is so named because it's frequently impossible to sort out how much is  CR/sinusitis with freq throat clearing (which can be related to primary GERD)   vs  causing  secondary ( extra esophageal)  GERD from wide swings in gastric pressure that occur with throat clearing, often  promoting self use of mint and menthol lozenges that reduce the lower esophageal sphincter tone and exacerbate the problem further in a cyclical fashion.   These are the same pts (now being labeled as having irritable larynx syndrome by some cough centers) who not infrequently have a history of having failed to tolerate ace inhibitors,  dry powder inhalers or biphosphonates or report having atypical/extraesophageal reflux symptoms from LPR (globus, throat clearing)  that don't respond to standard doses of PPI  and are easily confused as having aecopd or asthma flares by even experienced allergists/ pulmonologists (myself included).   Of the three most common causes of  Sub-acute / recurrent or chronic cough, only one (GERD)  can actually contribute to/ trigger  the other two (asthma and post nasal drip syndrome)  and perpetuate the cylce of cough.  While not intuitively obvious, many patients with chronic low grade reflux do not cough until there is a primary insult that disturbs the protective epithelial barrier and exposes sensitive nerve endings.   This is typically viral but can due to PNDS and  either may apply here.     >>> The point is that  once this occurs, it is difficult to eliminate the cycle  using anything but a maximally effective  diet to address non acid GERD and control / eliminate the cough itself for at least 3 days with tessalon  while elimiting at least the noct cough with 1st gen H1 blockers per guidelines           Each maintenance medication was reviewed in detail including emphasizing most importantly the difference between maintenance and prns and under what circumstances the prns are to be triggered using an action plan format where appropriate.  Total time for H and P, chart review, counseling, reviewing nasal  device(s) and generating customized AVS unique to this office visit / same day charting = 60 min new pt eval  for refractory respiratory  symptoms of uncertain etiology

## 2024-06-23 ENCOUNTER — Telehealth: Payer: Self-pay | Admitting: *Deleted

## 2024-06-23 LAB — COMPREHENSIVE METABOLIC PANEL WITH GFR
ALT: 10 IU/L (ref 0–32)
AST: 13 IU/L (ref 0–40)
Albumin: 4.2 g/dL (ref 3.8–4.8)
Alkaline Phosphatase: 70 IU/L (ref 49–135)
BUN/Creatinine Ratio: 16 (ref 12–28)
BUN: 9 mg/dL (ref 8–27)
Bilirubin Total: 0.3 mg/dL (ref 0.0–1.2)
CO2: 25 mmol/L (ref 20–29)
Calcium: 9.1 mg/dL (ref 8.7–10.3)
Chloride: 106 mmol/L (ref 96–106)
Creatinine, Ser: 0.58 mg/dL (ref 0.57–1.00)
Globulin, Total: 2 g/dL (ref 1.5–4.5)
Glucose: 108 mg/dL — ABNORMAL HIGH (ref 70–99)
Potassium: 3.8 mmol/L (ref 3.5–5.2)
Sodium: 143 mmol/L (ref 134–144)
Total Protein: 6.2 g/dL (ref 6.0–8.5)
eGFR: 93 mL/min/1.73 (ref >59)

## 2024-06-23 LAB — TSH: TSH: 0.902 u[IU]/mL (ref 0.450–4.500)

## 2024-06-23 LAB — LIPID PANEL
Chol/HDL Ratio: 2 ratio (ref 0.0–4.4)
Cholesterol, Total: 149 mg/dL (ref 100–199)
HDL: 74 mg/dL (ref >39)
LDL Chol Calc (NIH): 46 mg/dL (ref 0–99)
Triglycerides: 182 mg/dL — ABNORMAL HIGH (ref 0–149)
VLDL Cholesterol Cal: 29 mg/dL (ref 5–40)

## 2024-06-23 LAB — T4, FREE: Free T4: 1.29 ng/dL (ref 0.82–1.77)

## 2024-06-23 NOTE — Telephone Encounter (Signed)
 Copied from CRM #[id number]. Topic: Clinical - Medication Question >> Jun 23, 2024 10:07 AM Amanda Harmon wrote: Reason for CRM: Patient is requesting to speak with Amy in regard to medications prescribed - ipratropium (ATROVENT ) 0.03 % nasal spray   Patient states she was suppose to get ATROVENT  prescribed but received ipratropium and there is Harmon difference in both.   Callback number: [phone]   Ipratropium is the generic of atrovent , and is fine for her to use. I called her and there was no answer- I left her Harmon detailed msg explaining this to her (ok per DPR) and asked her to call back if she has any additional questions.

## 2024-06-24 ENCOUNTER — Ambulatory Visit: Payer: Self-pay | Admitting: Internal Medicine

## 2024-06-24 LAB — CBC WITH DIFFERENTIAL/PLATELET
Basophils Absolute: 0.1 x10E3/uL (ref 0.0–0.2)
Basos: 1 %
EOS (ABSOLUTE): 0.1 x10E3/uL (ref 0.0–0.4)
Eos: 1 %
Hematocrit: 41.6 % (ref 34.0–46.6)
Hemoglobin: 13.6 g/dL (ref 11.1–15.9)
Immature Grans (Abs): 0 x10E3/uL (ref 0.0–0.1)
Immature Granulocytes: 0 %
Lymphocytes Absolute: 1.5 x10E3/uL (ref 0.7–3.1)
Lymphs: 25 %
MCH: 30.5 pg (ref 26.6–33.0)
MCHC: 32.7 g/dL (ref 31.5–35.7)
MCV: 93 fL (ref 79–97)
Monocytes Absolute: 0.5 x10E3/uL (ref 0.1–0.9)
Monocytes: 9 %
Neutrophils Absolute: 3.8 x10E3/uL (ref 1.4–7.0)
Neutrophils: 64 %
Platelets: 221 x10E3/uL (ref 150–450)
RBC: 4.46 x10E6/uL (ref 3.77–5.28)
RDW: 12.5 % (ref 11.7–15.4)
WBC: 5.9 x10E3/uL (ref 3.4–10.8)

## 2024-06-24 LAB — IGE: IgE (Immunoglobulin E), Serum: 3 [IU]/mL — ABNORMAL LOW (ref 6–495)

## 2024-06-26 NOTE — Progress Notes (Signed)
 Called and informed pt of her results - pt is confused on the nasal spray. She states she was prescribed a nasal spray back in oct by the ENT under the name Atrovent  and now she is prescribe ipatrpium at a different strength and different directions than her previous rx- I informed pt that is the same nasal spray its the generic and she should use it as directed from you from her LOV - Please advise if I am wrong

## 2024-06-27 NOTE — Progress Notes (Signed)
 Called and left detailed message on answering machine - lmtcb with any questions

## 2024-06-28 NOTE — Telephone Encounter (Signed)
 Copied from CRM [id number]. Topic: General - Call Back - No Documentation >> Jun 23, 2024  3:18 PM Chantha C wrote: Reason for CRM: Patient [phone] is returning Leslie's call. Informed patient the office is closed and will be open on Monday regular hours. No information of the call was disclosed. Patient asked for a call back in the afternoon, not early morning please.  See phone notes

## 2024-06-28 NOTE — Telephone Encounter (Signed)
 Copied from CRM #[id number]. Topic: Clinical - Lab/Test Results >> Jun 26, 2024 12:25 PM Amanda Harmon wrote: Reason for CRM: Patient is returning [place] phone call regarding a medication she is confused about the strength and what the medication is, she stated it was not Atrovent  it was Azlastine hydrochloride nasal spray .01% and she wanted to update [name] regarding this. Patient would like a callback regarding this, she stated Dr.Wert does not need to be consulted and she is fine with it.  ATC x1

## 2024-06-28 NOTE — Progress Notes (Signed)
 Atc x1 lmtcb

## 2024-06-29 ENCOUNTER — Encounter: Payer: Self-pay | Admitting: "Endocrinology

## 2024-06-29 ENCOUNTER — Ambulatory Visit: Admitting: "Endocrinology

## 2024-06-29 VITALS — BP 130/64 | HR 68 | Ht 67.0 in | Wt 130.0 lb

## 2024-06-29 DIAGNOSIS — M810 Age-related osteoporosis without current pathological fracture: Secondary | ICD-10-CM

## 2024-06-29 DIAGNOSIS — E039 Hypothyroidism, unspecified: Secondary | ICD-10-CM

## 2024-06-29 DIAGNOSIS — E559 Vitamin D deficiency, unspecified: Secondary | ICD-10-CM

## 2024-06-29 DIAGNOSIS — E782 Mixed hyperlipidemia: Secondary | ICD-10-CM | POA: Diagnosis not present

## 2024-06-29 MED ORDER — DENOSUMAB-BBDZ 60 MG/ML ~~LOC~~ SOSY
60.0000 mg | PREFILLED_SYRINGE | Freq: Once | SUBCUTANEOUS | Status: AC
  Administered 2024-06-29: 60 mg via SUBCUTANEOUS

## 2024-06-29 NOTE — Addendum Note (Signed)
 Addended by: CLAUDENE ZADA POUR on: 06/29/2024 02:15 PM   Modules accepted: Orders

## 2024-06-29 NOTE — Progress Notes (Signed)
 Pt seen for follow up with Dr.Nida and receive Jubbonti  injection. Pt supplied her Jubbonti  60mg  (Lot [id number], Exp.Date 11/2025). Jubbonti  60mg  injected SQ in upper L arm without difficulty. Pt waited 15 minutes post injection, no adverse reactions noted.

## 2024-06-29 NOTE — Progress Notes (Signed)
 06/29/2024      Endocrinology follow-up note   Subjective:    Patient ID: Amanda Harmon, female    DOB: 15-Jun-1946, PCP Antonetta Rollene BRAVO, MD   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    Cystitis    Hyperlipidemia    borderline    Mitral valve prolapse 1984   Pericardial effusion    Thyroid  disease 2015   hypothyroidism   Vertigo    Past Surgical History:  Procedure Laterality Date   BACK SURGERY  2008   BREAST BIOPSY  1998   left - benign   CARPECTOMY HAND Right 06/22/2016   COLONOSCOPY N/A 01/17/2015   Procedure: COLONOSCOPY;  Surgeon: Claudis RAYMOND Rivet, MD;  Location: [facility];  Service: Endoscopy;  Laterality: N/A;  830 - moved to 4/21 @ 8:30   EYE SURGERY Left 12/01/2013   cataract   EYE SURGERY Right 12/15/2013   cataract   IR GENERIC HISTORICAL  08/19/2016   IR RADIOLOGIST EVAL & MGMT 08/19/2016 [facility]   SPINE SURGERY  08/28/2007   TONSILLECTOMY  1965   TUBAL LIGATION  1983   Social History   Socioeconomic History   Marital status: Married    Spouse name: Vinie   Number of children: 3   Years of education: master's degree   Highest education level: Master's degree (e.g., MA, MS, MEng, MEd, MSW, MBA)  Occupational History   Occupation: Metallurgist: RETIRED  Tobacco Use   Smoking status: Former    Current packs/day: 0.00    Average packs/day: 1 pack/day for 4.0 years (4.0 ttl pk-yrs)    Types: Cigarettes    Start date: 12/15/1964    Quit date: 12/15/1968    Years since quitting: 55.5   Smokeless tobacco: Never  Vaping Use   Vaping status: Never Used  Substance and Sexual Activity   Alcohol use: Yes    Comment: socially    Drug use: No   Sexual activity: Yes  Other Topics Concern   Not on file  Social History Narrative   Not on file   Social Drivers of Health   Financial Resource Strain: Low Risk  (07/14/2023)   Overall Financial Resource Strain (CARDIA)     Difficulty of Paying Living Expenses: Not hard at all  Food Insecurity: No Food Insecurity (07/14/2023)   Hunger Vital Sign    Worried About Running Out of Food in the Last Year: Never true    Ran Out of Food in the Last Year: Never true  Transportation Needs: No Transportation Needs (07/14/2023)   PRAPARE - Administrator, Civil Service (Medical): No    Lack of Transportation (Non-Medical): No  Physical Activity: Sufficiently Active (07/14/2023)   Exercise Vital Sign    Days of Exercise per Week: 5 days    Minutes of Exercise per Session: 90 min  Stress: Stress Concern Present (07/14/2023)   [employer] of Occupational Health - Occupational Stress Questionnaire    Feeling of Stress : To some extent  Social Connections: Socially Integrated (07/14/2023)   Social Connection and Isolation Panel    Frequency of Communication with Friends and Family: More than three times a week    Frequency of Social Gatherings with Friends and Family: More than three times a week    Attends Religious Services: More than 4 times per year    Active Member of [employer] or Organizations: Yes  Attends Banker Meetings: More than 4 times per year    Marital Status: Married   Outpatient Encounter Medications as of 06/29/2024  Medication Sig   amLODipine  (NORVASC ) 2.5 MG tablet TAKE 1 TABLET BY MOUTH DAILY. PLEASE CALL [phone] TO SCHEDULE OVERDUE APPOINTMENT.   Ascorbic Acid (VITAMIN C) 1000 MG tablet Take 1,000 mg by mouth daily.   benzonatate  (TESSALON ) 200 MG capsule Take 1 capsule (200 mg total) by mouth 3 (three) times daily as needed for cough.   Calcium  Carbonate-Vit D-Min (CALCIUM  1200 PO) Take 1 tablet by mouth daily.   conjugated estrogens  (PREMARIN ) vaginal cream Applyt fingertip amouiunt daily for 1 week, then every 2 to 3 days , as needed for vaginal dryness/irritation   denosumab -bbdz (JUBBONTI ) 60 MG/ML SOSY Inject 60 mg into the skin every 6 (six) months.    ipratropium (ATROVENT ) 0.03 % nasal spray Place 1 spray into both nostrils 2 (two) times daily.   levothyroxine  (SYNTHROID ) 50 MCG tablet TAKE 1 TABLET BY MOUTH EVERY DAY IN THE MORNING   rosuvastatin  (CRESTOR ) 10 MG tablet TAKE 1 TABLET BY MOUTH EVERYDAY AT BEDTIME   No facility-administered encounter medications on file as of 06/29/2024.   ALLERGIES: Allergies  Allergen Reactions   Aspirin Other (See Comments)    Bruising   Poison Oak Extract [Poison Oak Extract] Rash   VACCINATION STATUS: Immunization History  Administered Date(s) Administered    sv, Bivalent, Protein Subunit Rsvpref,pf (Abrysvo) 07/20/2022   Fluad Quad(high Dose 65+) 07/19/2020, 08/05/2022   INFLUENZA, HIGH DOSE SEASONAL PF 07/05/2018, 06/12/2019   Influenza Split 06/17/2014, 07/23/2017   Influenza,inj,Quad PF,6+ Mos 07/20/2013, 07/01/2015, 07/15/2016   Influenza-Unspecified 07/22/2021, 07/12/2023   Moderna Covid-19 Vaccine Bivalent Booster 5yrs & up 07/03/2022, 06/14/2023   Moderna Sars-Covid-2 Vaccination 11/02/2019, 12/05/2019, 07/23/2020, 04/04/2021, 08/11/2021   Pneumococcal Conjugate-13 10/30/2014, 08/23/2017   Pneumococcal Polysaccharide-23 03/23/2012   Tdap 12/09/2012, 04/05/2023   Zoster Recombinant(Shingrix) 08/02/2019, 02/09/2020   Zoster, Live 02/24/2007    HPI  78 -year-old female patient with medical history as above.  She is returning to follow-up for osteoporosis, hypothyroidism, hyperlipidemia.   She has no interval falls/fractures.  She has no new complaints today.  She has taken alendronate  for 6 years before she was switched to Prolia  60 mg subcu every 54-month.  She continues to tolerate this intervention.  She is here today for her next injection.   For hypothyroidism, she remains on levothyroxine  50 mcg p.o. daily before breakfast.  She continues to tolerate very well.  Her previsit thyroid  function tests are consistent with appropriate replacement.  She also has postmenopausal  osteoporosis for which she has received treatment with alendronate  70 mg p.o. weekly for 6 years.  Due to insufficient response, she was switched to Prolia  last year, continued to tolerate.  She is ready for her next dose today.  She has no interval falls nor fractures.  Appropriate replacement.    .  She has history of hand fracture 2 years ago.    - She has history of compression fracture of first lumbar vertebra. No recent height change. She denies hx of goiter. Her last menstrual period was at age 24. she has 3 grown children. she is a non-smoker.   she never took steroids on chronic basis. she denies cold/heat intolerance. she is active , exercises regularly. She was started on low-dose Crestor  for dyslipidemia, which she has responded with controlled LDL at 66, overall improving from 126.  Review of Systems Limited as above.  Objective:    BP 130/64   Pulse 68   Ht 5' 7 (1.702 m)   Wt 130 lb (59 kg)   BMI 20.36 kg/m   Wt Readings from Last 3 Encounters:  06/29/24 130 lb (59 kg)  06/22/24 131 lb 3.2 oz (59.5 kg)  05/18/24 131 lb (59.4 kg)    Physical Exam   Exam is significant for tenderness to pressure on trochanteric line bilaterally.  No gait deficit.  CMP     Component Value Date/Time   NA 143 06/22/2024 1619   K 3.8 06/22/2024 1619   CL 106 06/22/2024 1619   CO2 25 06/22/2024 1619   GLUCOSE 108 (H) 06/22/2024 1619   GLUCOSE 93 04/08/2020 0846   BUN 9 06/22/2024 1619   CREATININE 0.58 06/22/2024 1619   CREATININE 0.68 04/08/2020 0846   CALCIUM  9.1 06/22/2024 1619   PROT 6.2 06/22/2024 1619   ALBUMIN 4.2 06/22/2024 1619   AST 13 06/22/2024 1619   ALT 10 06/22/2024 1619   ALKPHOS 70 06/22/2024 1619   BILITOT 0.3 06/22/2024 1619   GFRNONAA 90 10/03/2019 0932   GFRAA 104 10/03/2019 0932    Latest Reference Range & Units 12/15/22 08:57  TSH 0.450 - 4.500 uIU/mL 1.530  T4,Free(Direct) 0.82 - 1.77 ng/dL 8.52    Lipid Panel     Component Value Date/Time    CHOL 149 06/22/2024 1619   TRIG 182 (H) 06/22/2024 1619   HDL 74 06/22/2024 1619   CHOLHDL 2.0 06/22/2024 1619   CHOLHDL 3.3 04/08/2020 0854   VLDL 24 12/08/2016 0829   LDLCALC 46 06/22/2024 1619   LDLCALC 126 (H) 04/08/2020 0854   June 28, 2018 labs TSH 2.9 7, free T4 1.2  DualFemur Neck Right 12/16/2022 76.3 Osteopenia -2.0 0.756 g/cm2 -4.7% Yes DualFemur Neck Right 12/11/2020 74.3 Osteopenia -1.8 0.793 g/cm2 3.3% - DualFemur Neck Right 11/03/2018 72.2 Osteopenia -1.9 0.768 g/cm2 -3.6% -    DualFemur Total Mean 12/16/2022 76.3 Osteopenia -1.6 0.806 g/cm2 -0.2% - DualFemur Total Mean 12/11/2020 74.3 Osteopenia -1.6 0.808 g/cm2 -0.6% - DualFemur Total Mean 11/03/2018 72.2 Osteopenia -1.5 0.813 g/cm2 1.9% -   Left Forearm Radius 33% 12/16/2022 76.3 Osteopenia -2.1 0.560 g/cm2 - - ASSESSMENT: The BMD measured at Forearm Radius 33% is 0.560 g/cm2 with a T-score of -2.1. This patient is considered osteopenic according to World Health Organization [facility]) criteria. The scan quality is good. Compared with the prior study on 12/11/20, the BMD of the total mean shows no statistically significant change. Lumbar spine was excluded due to advanced degenerative changes. Patient is not a candidate for FRAX assessment due to fosamax . .     Assessment & Plan:    1. Hypothyroidism Her previsit thyroid  function tests are consistent with appropriate replacement.  She is advised to continue levothyroxine  50 mcg p.o. daily before breakfast.   - We discussed about the correct intake of her thyroid  hormone, on empty stomach at fasting, with water , separated by at least 30 minutes from breakfast and other medications,  and separated by more than 4 hours from calcium , iron, multivitamins, acid reflux medications (PPIs). -Patient is made aware of the fact that thyroid  hormone replacement is needed for life, dose to be adjusted by periodic monitoring of thyroid  function tests.   2.   Osteoporosis  -She was previously treated with alendronate  for 6 years, before she was switched to Prolia  60 mg subcutaneous every 6 months approximately a year ago. She is ready for her next injection today.  Her next bone density is due in March 2026.   The importance of continued treatment with Prolia  to avoid withdrawal fracture was discussed with her. -She is advised to continue vitamin D3  2,000 units daily . -She will continue with calcium  carbonate 1200 mg daily with lunch. She  3. Hyperlipidemia:  - She has family history of hyperlipidemia and coronary artery disease.  Her previsit lipid panel shows LDL at 46, significantly improving.    - She is tolerating and advised to continue Crestor   10 mg p.o. nightly.  Side effects and precautions discussed with her.  As an alternative, whole food plant-based diet was discussed in detail and the packet of lifestyle medicine was given to her.  - I advised patient to maintain close follow up with Antonetta Rollene BRAVO, MD for primary care needs.   I spent  25  minutes in the care of the patient today including review of labs from Thyroid  Function, CMP, and other relevant labs ; imaging/biopsy records (current and previous including abstractions from other facilities); face-to-face time discussing  her lab results and symptoms, medications doses, her options of short and long term treatment based on the latest standards of care / guidelines;   and documenting the encounter.  Asberry LELON Dickens  participated in the discussions, expressed understanding, and voiced agreement with the above plans.  All questions were answered to her satisfaction. she is encouraged to contact clinic should she have any questions or concerns prior to her return visit.   Follow up plan: Return in about 6 months (around 12/28/2024) for Prolia (Jubbonti ) Today (ASAP) and P(J) NV.  Ranny Earl, MD Phone: [phone]  Fax: [phone]   06/29/2024, 1:42 PM

## 2024-07-04 MED ORDER — DENOSUMAB-BBDZ 60 MG/ML ~~LOC~~ SOSY
60.0000 mg | PREFILLED_SYRINGE | Freq: Once | SUBCUTANEOUS | Status: AC
  Administered 2024-07-04: 60 mg via SUBCUTANEOUS

## 2024-07-04 NOTE — Addendum Note (Signed)
 Addended by: CLAUDENE ZADA POUR on: 07/04/2024 09:56 AM   Modules accepted: Orders

## 2024-07-06 ENCOUNTER — Encounter: Payer: Self-pay | Admitting: Emergency Medicine

## 2024-07-06 ENCOUNTER — Ambulatory Visit: Admission: EM | Admit: 2024-07-06 | Discharge: 2024-07-06 | Disposition: A | Discharge status: Home / Self Care

## 2024-07-06 ENCOUNTER — Other Ambulatory Visit: Payer: Self-pay

## 2024-07-06 DIAGNOSIS — S61216A Laceration without foreign body of right little finger without damage to nail, initial encounter: Secondary | ICD-10-CM

## 2024-07-06 DIAGNOSIS — S61217A Laceration without foreign body of left little finger without damage to nail, initial encounter: Secondary | ICD-10-CM

## 2024-07-06 NOTE — ED Triage Notes (Addendum)
 Pt reports sliced the tip of right little finger while trying to slice an onion this afternoon. Last tetanus within the last 2 years. Moderate bleeding noted to tip of finger. Pressure dressing applied. Denies being on blood thinner.

## 2024-07-06 NOTE — ED Notes (Signed)
 Pressure dressing removed. Mild bleeding still noted. Site currently soaking in Hibiclens, sterile water  mixture. Pt tolerating well at this time.

## 2024-07-06 NOTE — Discharge Instructions (Signed)
 Please keep the cut clean and dry.  Leave the first bandage on for 24 hours then remove, clean the wound with soap and water , apply a thin layer of antibiotic ointment, and cover with nonadherent gauze and tape.  Repeat this process twice daily until a scab forms and keep the area covered until it heals.  Seek care if signs of symptoms of infection develop such as drainage from the wound, redness, or pain.

## 2024-07-06 NOTE — ED Provider Notes (Signed)
 [facility]    CSN: [id number] Arrival date & time: 07/06/24  1541      History   Chief Complaint Chief Complaint  Patient presents with   Laceration    HPI Amanda Harmon is a 78 y.o. female.   Patient presents today with a cut to her right pinky finger that occurred about 1 hour ago while slicing onions.  Reports the area has not stopped bleeding since it occurred.  No change in color of the digit.  Patient is able to move pinky finger normally.    Patient denies blood thinner use.  She is up to date on Tdap.    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    Cystitis    Hyperlipidemia    borderline    Mitral valve prolapse 1984   Pericardial effusion    Thyroid  disease 2015   hypothyroidism   Vertigo     Patient Active Problem List   Diagnosis Date Noted   Upper airway cough syndrome 06/22/2024   Chronic cough 05/18/2024   Change in voice 03/19/2023   Mixed hyperlipidemia 12/24/2022   Lumbar pain 10/03/2022   Essential hypertension 09/15/2021   Allergic rhinitis 10/21/2017   Osteopenia 08/24/2017   At high risk for injury related to fall 08/30/2016   Vitamin D  deficiency 08/07/2016   Compression fracture of first lumbar vertebra (HCC) 07/15/2016   Annual physical exam 04/27/2015   FH: CAD (coronary artery disease) 10/31/2014   Hypothyroidism 02/26/2014   Cystitis 12/07/2010   Postmenopausal osteoporosis of multiple sites 03/26/2010   Dyslipidemia 02/16/2008    Past Surgical History:  Procedure Laterality Date   BACK SURGERY  2008   BREAST BIOPSY  1998   left - benign   CARPECTOMY HAND Right 06/22/2016   COLONOSCOPY N/A 01/17/2015   Procedure: COLONOSCOPY;  Surgeon: Claudis RAYMOND Rivet, MD;  Location: [facility];  Service: Endoscopy;  Laterality: N/A;  830 - moved to 4/21 @ 8:30   EYE SURGERY Left 12/01/2013   cataract   EYE SURGERY Right 12/15/2013   cataract   IR GENERIC HISTORICAL  08/19/2016    IR RADIOLOGIST EVAL & MGMT 08/19/2016 [facility]   SPINE SURGERY  08/28/2007   TONSILLECTOMY  1965   TUBAL LIGATION  1983    OB History   No obstetric history on file.      Home Medications    Prior to Admission medications   Medication Sig Start Date End Date Taking? Authorizing Provider  denosumab -bbdz (JUBBONTI ) 60 MG/ML SOSY Inject 60 mg into the skin every 6 (six) months. 06/15/24  Yes Nida, Ethelle LELON, MD  amLODipine  (NORVASC ) 2.5 MG tablet TAKE 1 TABLET BY MOUTH DAILY. PLEASE CALL [phone] TO SCHEDULE OVERDUE APPOINTMENT. 01/07/24   Lucien Orren SAILOR, PA-C  Ascorbic Acid (VITAMIN C) 1000 MG tablet Take 1,000 mg by mouth daily.    [provider]  benzonatate  (TESSALON ) 200 MG capsule Take 1 capsule (200 mg total) by mouth 3 (three) times daily as needed for cough. 06/22/24   Darlean Ozell NOVAK, MD  Calcium  Carbonate-Vit D-Min (CALCIUM  1200 PO) Take 1 tablet by mouth daily.    [provider]  conjugated estrogens  (PREMARIN ) vaginal cream Applyt fingertip amouiunt daily for 1 week, then every 2 to 3 days , as needed for vaginal dryness/irritation 07/27/23   Antonetta Rollene BRAVO, MD  ipratropium (ATROVENT ) 0.03 % nasal spray Place 1 spray into both nostrils 2 (  two) times daily. 06/22/24   Darlean Ozell NOVAK, MD  levothyroxine  (SYNTHROID ) 50 MCG tablet TAKE 1 TABLET BY MOUTH EVERY DAY IN THE MORNING 06/16/24   Lenis Ethelle ORN, MD  rosuvastatin  (CRESTOR ) 10 MG tablet TAKE 1 TABLET BY MOUTH EVERYDAY AT BEDTIME 04/24/24   Lenis Ethelle ORN, MD    Family History Family History  Problem Relation Age of Onset   Heart attack Mother 53       used tobacco    Arthritis Mother        severe    Heart attack Father 4       used tobacco    Alcohol abuse Father    Cancer Brother 46       prostate    Osteoporosis Sister        severe    Osteoarthritis Sister     Social History Social History   Tobacco Use   Smoking status: Former    Current packs/day:  0.00    Average packs/day: 1 pack/day for 4.0 years (4.0 ttl pk-yrs)    Types: Cigarettes    Start date: 12/15/1964    Quit date: 12/15/1968    Years since quitting: 55.5   Smokeless tobacco: Never  Vaping Use   Vaping status: Never Used  Substance Use Topics   Alcohol use: Yes    Comment: socially    Drug use: No     Allergies   Aspirin and Poison oak extract [poison oak extract]   Review of Systems Review of Systems Per HPI  Physical Exam Triage Vital Signs ED Triage Vitals [07/06/24 1551]  Encounter Vitals Group     BP 130/66     Girls Systolic BP Percentile      Girls Diastolic BP Percentile      Boys Systolic BP Percentile      Boys Diastolic BP Percentile      Pulse Rate 73     Resp 20     Temp 98.4 F (36.9 C)     Temp Source Oral     SpO2 93 %     Weight      Height      Head Circumference      Peak Flow      Pain Score 1     Pain Loc      Pain Education      Exclude from Growth Chart    No data found.  Updated Vital Signs BP 130/66 (BP Location: Right Arm)   Pulse 73   Temp 98.4 F (36.9 C) (Oral)   Resp 20   SpO2 93%   Visual Acuity Right Eye Distance:   Left Eye Distance:   Bilateral Distance:    Right Eye Near:   Left Eye Near:    Bilateral Near:     Physical Exam Vitals and nursing note reviewed.  Constitutional:      General: She is not in acute distress.    Appearance: Normal appearance. She is not toxic-appearing.  HENT:     Mouth/Throat:     Mouth: Mucous membranes are moist.     Pharynx: Oropharynx is clear.  Pulmonary:     Effort: Pulmonary effort is normal. No respiratory distress.  Skin:    General: Skin is warm and dry.     Capillary Refill: Capillary refill takes less than 2 seconds.     Findings: Laceration present.     Comments: Avulsed triangle shaped laceration right distal fifth digit.  Neurological:     Mental Status: She is alert and oriented to person, place, and time.  Psychiatric:        Behavior:  Behavior is cooperative.      UC Treatments / Results  Labs (all labs ordered are listed, but only abnormal results are displayed) Labs Reviewed - No data to display  EKG   Radiology No results found.  Procedures Procedures (including critical care time)  Medications Ordered in UC Medications - No data to display  Initial Impression / Assessment and Plan / UC Course  I have reviewed the triage vital signs and the nursing notes.  Pertinent labs & imaging results that were available during my care of the patient were reviewed by me and considered in my medical decision making (see chart for details).   Patient is well-appearing, normotensive, afebrile, not tachycardic, not tachypneic, oxygenating well on room air.   1. Laceration of right little finger without foreign body without damage to nail, initial encounter We discussed wound closure not possible given type of laceration will need to heal via secondary intention Bleeding controlled with silver nitrate Wound care discussed Return precautions discussed   The patient was given the opportunity to ask questions.  All questions answered to their satisfaction.  The patient is in agreement to this plan.   Final Clinical Impressions(s) / UC Diagnoses   Final diagnoses:  Laceration of right little finger without foreign body without damage to nail, initial encounter     Discharge Instructions      Please keep the cut clean and dry.  Leave the first bandage on for 24 hours then remove, clean the wound with soap and water , apply a thin layer of antibiotic ointment, and cover with nonadherent gauze and tape.  Repeat this process twice daily until a scab forms and keep the area covered until it heals.  Seek care if signs of symptoms of infection develop such as drainage from the wound, redness, or pain.    ED Prescriptions   None    PDMP not reviewed this encounter.   Chandra Harlene LABOR, NP 07/06/24 [phone]

## 2024-07-06 NOTE — ED Notes (Addendum)
 Site removed from soaking mixture, drying off site. Mild bleeding noted. NP preparing to enter room to perform exam.

## 2024-07-12 ENCOUNTER — Ambulatory Visit
Admission: EM | Admit: 2024-07-12 | Discharge: 2024-07-12 | Disposition: A | Discharge status: Home / Self Care | Attending: Nurse Practitioner | Admitting: Nurse Practitioner

## 2024-07-12 DIAGNOSIS — S61217D Laceration without foreign body of left little finger without damage to nail, subsequent encounter: Secondary | ICD-10-CM | POA: Diagnosis not present

## 2024-07-12 MED ORDER — MUPIROCIN 2 % EX OINT: 1.0000 | TOPICAL_OINTMENT | Freq: Two times a day (BID) | CUTANEOUS | 0 refills | Status: AC

## 2024-07-12 NOTE — Discharge Instructions (Signed)
 Apply ointment as prescribed. You may take over-the-counter Tylenol  as needed for pain, fever, or general discomfort. Apply ice to the affected area to help with pain, swelling, or general discomfort. Continue cleansing the area twice daily with an antibacterial soap that you are currently using.  Also when you are home, recommend keeping the area open to air to promote healing. Continue to monitor for signs of worsening to include increased redness, swelling, or foul-smelling drainage from the site.  If you develop any symptoms, you may follow-up in this clinic or with your primary care physician for further evaluation. Follow-up as needed.

## 2024-07-12 NOTE — ED Triage Notes (Signed)
 Pt states she sliced her left pinky finger with a knife and was seen on 10/9 where she had it cauterized. Pt states she is having some pain and swelling.

## 2024-07-12 NOTE — ED Provider Notes (Signed)
 [facility]    CSN: [id number] Arrival date & time: 07/12/24  1019      History   Chief Complaint Chief Complaint  Patient presents with   Wound Check    HPI Amanda Harmon is a 78 y.o. female.   The history is provided by the patient.   Patient presents for follow-up for a laceration to the left small finger.  Patient was seen in this clinic on 07/06/2024 after she cut the finger pad when she was slicing onions.  She was unable to control bleeding at that time.  Silver nitrate was applied at that time.  She states that she continues to have some redness and mild swelling at the tip of the left small finger.  She denies fever, chills, foul-smelling drainage from the site, numbness, tingling, or radiation of pain.  States that she has been cleaning the finger with Dial antibacterial soap and using Neosporin.  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    Cystitis    Hyperlipidemia    borderline    Mitral valve prolapse 1984   Pericardial effusion    Thyroid  disease 2015   hypothyroidism   Vertigo     Patient Active Problem List   Diagnosis Date Noted   Upper airway cough syndrome 06/22/2024   Chronic cough 05/18/2024   Change in voice 03/19/2023   Mixed hyperlipidemia 12/24/2022   Lumbar pain 10/03/2022   Essential hypertension 09/15/2021   Allergic rhinitis 10/21/2017   Osteopenia 08/24/2017   At high risk for injury related to fall 08/30/2016   Vitamin D  deficiency 08/07/2016   Compression fracture of first lumbar vertebra (HCC) 07/15/2016   Annual physical exam 04/27/2015   FH: CAD (coronary artery disease) 10/31/2014   Hypothyroidism 02/26/2014   Cystitis 12/07/2010   Postmenopausal osteoporosis of multiple sites 03/26/2010   Dyslipidemia 02/16/2008    Past Surgical History:  Procedure Laterality Date   BACK SURGERY  2008   BREAST BIOPSY  1998   left - benign   CARPECTOMY HAND Right 06/22/2016    COLONOSCOPY N/A 01/17/2015   Procedure: COLONOSCOPY;  Surgeon: Claudis RAYMOND Rivet, MD;  [facility];  Service: Endoscopy;  Laterality: N/A;  830 - moved to 4/21 @ 8:30   EYE SURGERY Left 12/01/2013   cataract   EYE SURGERY Right 12/15/2013   cataract   IR GENERIC HISTORICAL  08/19/2016   IR RADIOLOGIST EVAL & MGMT 08/19/2016 [facility]   SPINE SURGERY  08/28/2007   TONSILLECTOMY  1965   TUBAL LIGATION  1983    OB History   No obstetric history on file.      Home Medications    Prior to Admission medications   Medication Sig Start Date End Date Taking? Authorizing Provider  amLODipine  (NORVASC ) 2.5 MG tablet TAKE 1 TABLET BY MOUTH DAILY. PLEASE CALL [phone] TO SCHEDULE OVERDUE APPOINTMENT. 01/07/24   Lucien Orren SAILOR, PA-C  Ascorbic Acid (VITAMIN C) 1000 MG tablet Take 1,000 mg by mouth daily.    [provider]  benzonatate  (TESSALON ) 200 MG capsule Take 1 capsule (200 mg total) by mouth 3 (three) times daily as needed for cough. 06/22/24   Darlean Ozell NOVAK, MD  Calcium  Carbonate-Vit D-Min (CALCIUM  1200 PO) Take 1 tablet by mouth daily.    [provider]  conjugated estrogens  (PREMARIN ) vaginal cream Applyt fingertip amouiunt daily for 1 week, then every 2 to 3 days , as needed  for vaginal dryness/irritation 07/27/23   Antonetta Rollene BRAVO, MD  denosumab -bbdz (JUBBONTI ) 60 MG/ML SOSY Inject 60 mg into the skin every 6 (six) months. 06/15/24   Nida, Gebreselassie W, MD  ipratropium (ATROVENT ) 0.03 % nasal spray Place 1 spray into both nostrils 2 (two) times daily. 06/22/24   Darlean Ozell NOVAK, MD  levothyroxine  (SYNTHROID ) 50 MCG tablet TAKE 1 TABLET BY MOUTH EVERY DAY IN THE MORNING 06/16/24   Nida, Gebreselassie W, MD  rosuvastatin  (CRESTOR ) 10 MG tablet TAKE 1 TABLET BY MOUTH EVERYDAY AT BEDTIME 04/24/24   Lenis Ethelle ORN, MD    Family History Family History  Problem Relation Age of Onset   Heart attack Mother 39       used tobacco    Arthritis  Mother        severe    Heart attack Father 67       used tobacco    Alcohol abuse Father    Cancer Brother 37       prostate    Osteoporosis Sister        severe    Osteoarthritis Sister     Social History Social History   Tobacco Use   Smoking status: Former    Current packs/day: 0.00    Average packs/day: 1 pack/day for 4.0 years (4.0 ttl pk-yrs)    Types: Cigarettes    Start date: 12/15/1964    Quit date: 12/15/1968    Years since quitting: 55.6   Smokeless tobacco: Never  Vaping Use   Vaping status: Never Used  Substance Use Topics   Alcohol use: Yes    Comment: socially    Drug use: No     Allergies   Aspirin and Poison oak extract [poison oak extract]   Review of Systems Review of Systems Per HPI  Physical Exam Triage Vital Signs ED Triage Vitals [07/12/24 1049]  Encounter Vitals Group     BP 106/65     Girls Systolic BP Percentile      Girls Diastolic BP Percentile      Boys Systolic BP Percentile      Boys Diastolic BP Percentile      Pulse Rate 100     Resp 18     Temp 98.8 F (37.1 C)     Temp Source Oral     SpO2 93 %     Weight      Height      Head Circumference      Peak Flow      Pain Score 2     Pain Loc      Pain Education      Exclude from Growth Chart    No data found.  Updated Vital Signs BP 106/65 (BP Location: Right Arm)   Pulse 100   Temp 98.8 F (37.1 C) (Oral)   Resp 18   SpO2 93%   Visual Acuity Right Eye Distance:   Left Eye Distance:   Bilateral Distance:    Right Eye Near:   Left Eye Near:    Bilateral Near:     Physical Exam Vitals and nursing note reviewed.  Constitutional:      General: She is not in acute distress.    Appearance: Normal appearance.  HENT:     Head: Normocephalic.  Eyes:     Extraocular Movements: Extraocular movements intact.     Pupils: Pupils are equal, round, and reactive to light.  Pulmonary:     Effort: Pulmonary  effort is normal.  Musculoskeletal:     Cervical back:  Normal range of motion.  Skin:    General: Skin is warm and dry.     Findings: Laceration present.     Comments: Healing laceration noted to distal left small finger. Scabbing is present. There is no oozing, fluctuance or drainage present. Mild erythema present.   Neurological:     General: No focal deficit present.     Mental Status: She is alert and oriented to person, place, and time.  Psychiatric:        Mood and Affect: Mood normal.        Behavior: Behavior normal.      UC Treatments / Results  Labs (all labs ordered are listed, but only abnormal results are displayed) Labs Reviewed - No data to display  EKG   Radiology No results found.  Procedures Procedures (including critical care time)  Medications Ordered in UC Medications - No data to display  Initial Impression / Assessment and Plan / UC Course  I have reviewed the triage vital signs and the nursing notes.  Pertinent labs & imaging results that were available during my care of the patient were reviewed by me and considered in my medical decision making (see chart for details).  Healing laceration noted to the finger pad of the left small finger.  Mild erythema and swelling present.  Will treat with mupirocin ointment.  Supportive care recommendations were provided and discussed with the patient to include over-the-counter analgesics for pain or discomfort, continuing to keep the area clean and dry, and to leave the area open to air to help promote healing.  Discussed indications with patient regarding follow-up.  Patient was in agreement with this plan of care and verbalizes understanding.  All questions were answered.  Patient stable for discharge.   Final Clinical Impressions(s) / UC Diagnoses   Final diagnoses:  None   Discharge Instructions   None    ED Prescriptions   None    PDMP not reviewed this encounter.   Gilmer Etta PARAS, NP 07/12/24 1117

## 2024-07-14 ENCOUNTER — Ambulatory Visit: Payer: Self-pay

## 2024-07-17 ENCOUNTER — Ambulatory Visit: Payer: PPO

## 2024-07-17 VITALS — Ht 67.0 in | Wt 130.0 lb

## 2024-07-17 DIAGNOSIS — Z0000 Encounter for general adult medical examination without abnormal findings: Secondary | ICD-10-CM | POA: Diagnosis not present

## 2024-07-17 NOTE — Patient Instructions (Signed)
 [name],  Thank you for taking the time for your Medicare Wellness Visit. I appreciate your continued commitment to your health goals. Please review the care plan we discussed, and feel free to reach out if I can assist you further.  Medicare recommends these wellness visits once per year to help you and your care team stay ahead of potential health issues. These visits are designed to focus on prevention, allowing your provider to concentrate on managing your acute and chronic conditions during your regular appointments.  Please note that Annual Wellness Visits do not include a physical exam. Some assessments may be limited, especially if the visit was conducted virtually. If needed, we may recommend a separate in-person follow-up with your provider.  Wishing you excellent health and many blessings in the year to come!  -Rawley Harju, CMA  Ongoing Care Seeing your primary care provider every 3 to 6 months helps us  monitor your health and provide consistent, personalized care.   Recommended Screenings:  Health Maintenance  Topic Date Due   Flu Shot  04/28/2024   DEXA scan (bone density measurement)  12/15/2024   COVID-19 Vaccine (9 - 2025-26 season) 01/04/2025   Breast Cancer Screening  06/07/2025   Medicare Annual Wellness Visit  07/17/2025   DTaP/Tdap/Td vaccine (3 - Td or Tdap) 04/04/2033   Pneumococcal Vaccine for age over 54  Completed   Hepatitis C Screening  Completed   Zoster (Shingles) Vaccine  Completed   Meningitis B Vaccine  Aged Out   Colon Cancer Screening  Discontinued   Cologuard (Stool DNA test)  Discontinued       07/17/2024    1:33 PM  Advanced Directives  Does Patient Have a Medical Advance Directive? No  Would patient like information on creating a medical advance directive? No - Patient declined   Advance Care Planning is important because it: Ensures you receive medical care that aligns with your values, goals, and preferences. Provides guidance to your  family and loved ones, reducing the emotional burden of decision-making during critical moments.  Vision: Annual vision screenings are recommended for early detection of glaucoma, cataracts, and diabetic retinopathy. These exams can also reveal signs of chronic conditions such as diabetes and high blood pressure.  Dental: Annual dental screenings help detect early signs of oral cancer, gum disease, and other conditions linked to overall health, including heart disease and diabetes.  Please see the attached documents for additional preventive care recommendations.

## 2024-07-17 NOTE — Progress Notes (Signed)
 Please attest and cosign this visit due to patients primary care provider not being immediately available at the time the visit was completed.   Subjective:   Amanda Harmon is a 78 y.o. who presents for a Medicare Wellness preventive visit. As a reminder, Annual Wellness Visits don't include a physical exam, and some assessments may be limited, especially if this visit is performed virtually. We may recommend an in-person follow-up visit with your provider if needed.  Visit Complete: Virtual I connected with  Amanda Harmon on 07/17/24 by a video and audio enabled telemedicine application and verified that I am speaking with the correct person using two identifiers.  Patient Location: Home  Provider Location: Home Office  I discussed the limitations of evaluation and management by telemedicine. The patient expressed understanding and agreed to proceed.  Vital Signs: Because this visit was a virtual/telehealth visit, some criteria may be missing or patient reported. Any vitals not documented were not able to be obtained and vitals that have been documented are patient reported.  Persons Participating in Visit: Patient.  AWV Questionnaire: No: Patient Medicare AWV questionnaire was not completed prior to this visit. Cardiac Risk Factors include: advanced age (>35men, >24 women);hypertension;dyslipidemia     Objective:    Today's Vitals   07/17/24 1335  Weight: 130 lb (59 kg)  Height: 5' 7 (1.702 m)  PainSc: 0-No pain   Body mass index is 20.36 kg/m.    07/17/2024    1:33 PM 07/14/2023    1:05 PM 08/06/2021    1:23 PM 07/18/2020    1:14 PM 07/18/2019    1:23 PM 04/27/2017   12:56 PM 04/13/2017    2:36 PM  Advanced Directives  Does Patient Have a Medical Advance Directive? No No Yes No Yes No  No   Type of Best boy of [place];Living will  Healthcare Power of [place];Living will    Does patient want to make changes to medical advance  directive?   No - Patient declined      Copy of Healthcare Power of Attorney in Chart?   No - copy requested  No - copy requested    Would patient like information on creating a medical advance directive? No - Patient declined No - Patient declined  No - Patient declined  No - Patient declined  No - Patient declined      Data saved with a previous flowsheet row definition    Current Medications (verified) Outpatient Encounter Medications as of 07/17/2024  Medication Sig   amLODipine  (NORVASC ) 2.5 MG tablet TAKE 1 TABLET BY MOUTH DAILY. PLEASE CALL [phone] TO SCHEDULE OVERDUE APPOINTMENT.   Ascorbic Acid (VITAMIN C) 1000 MG tablet Take 1,000 mg by mouth daily.   benzonatate  (TESSALON ) 200 MG capsule Take 1 capsule (200 mg total) by mouth 3 (three) times daily as needed for cough.   Calcium  Carbonate-Vit D-Min (CALCIUM  1200 PO) Take 1 tablet by mouth daily.   conjugated estrogens  (PREMARIN ) vaginal cream Applyt fingertip amouiunt daily for 1 week, then every 2 to 3 days , as needed for vaginal dryness/irritation   denosumab -bbdz (JUBBONTI ) 60 MG/ML SOSY Inject 60 mg into the skin every 6 (six) months.   ipratropium (ATROVENT ) 0.03 % nasal spray Place 1 spray into both nostrils 2 (two) times daily.   levothyroxine  (SYNTHROID ) 50 MCG tablet TAKE 1 TABLET BY MOUTH EVERY DAY IN THE MORNING   mupirocin ointment (BACTROBAN) 2 % Apply 1 Application topically 2 (two)  times daily.   rosuvastatin  (CRESTOR ) 10 MG tablet TAKE 1 TABLET BY MOUTH EVERYDAY AT BEDTIME   No facility-administered encounter medications on file as of 07/17/2024.    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    Cystitis    Hyperlipidemia    borderline    Mitral valve prolapse 1984   Pericardial effusion    Thyroid  disease 2015   hypothyroidism   Vertigo    Past Surgical History:  Procedure  Laterality Date   BACK SURGERY  2008   BREAST BIOPSY  1998   left - benign   CARPECTOMY HAND Right 06/22/2016   COLONOSCOPY N/A 01/17/2015   Procedure: COLONOSCOPY;  Surgeon: Claudis RAYMOND Rivet, MD;  [facility];  Service: Endoscopy;  Laterality: N/A;  830 - moved to 4/21 @ 8:30   EYE SURGERY Left 12/01/2013   cataract   EYE SURGERY Right 12/15/2013   cataract   IR GENERIC HISTORICAL  08/19/2016   IR RADIOLOGIST EVAL & MGMT 08/19/2016 [facility]   SPINE SURGERY  08/28/2007   TONSILLECTOMY  1965   TUBAL LIGATION  1983   Family History  Problem Relation Age of Onset   Heart attack Mother 52       used tobacco    Arthritis Mother        severe    Heart attack Father 66       used tobacco    Alcohol abuse Father    Cancer Brother 93       prostate    Osteoporosis Sister        severe    Osteoarthritis Sister    Social History   Socioeconomic History   Marital status: Married    Spouse name: Vinie   Number of children: 3   Years of education: master's degree   Highest education level: Master's degree (e.g., MA, MS, MEng, MEd, MSW, MBA)  Occupational History   Occupation: Metallurgist: RETIRED  Tobacco Use   Smoking status: Former    Current packs/day: 0.00    Average packs/day: 1 pack/day for 4.0 years (4.0 ttl pk-yrs)    Types: Cigarettes    Start date: 12/15/1964    Quit date: 12/15/1968    Years since quitting: 55.6   Smokeless tobacco: Never  Vaping Use   Vaping status: Never Used  Substance and Sexual Activity   Alcohol use: Yes    Comment: socially    Drug use: No   Sexual activity: Yes  Other Topics Concern   Not on file  Social History Narrative   Not on file   Social Drivers of Health   Financial Resource Strain: Low Risk  (07/17/2024)   Overall Financial Resource Strain (CARDIA)    Difficulty of Paying Living Expenses: Not hard at all  Food Insecurity: No Food Insecurity (07/17/2024)   Hunger Vital Sign     Worried About Running Out of Food in the Last Year: Never true    Ran Out of Food in the Last Year: Never true  Transportation Needs: No Transportation Needs (07/17/2024)   PRAPARE - Administrator, Civil Service (Medical): No    Lack of Transportation (Non-Medical): No  Physical Activity: Sufficiently Active (07/17/2024)   Exercise Vital Sign    Days of Exercise per Week: 5 days    Minutes of  Exercise per Session: 90 min  Stress: No Stress Concern Present (07/17/2024)   [employer] of Occupational Health - Occupational Stress Questionnaire    Feeling of Stress: Not at all  Social Connections: Socially Integrated (07/17/2024)   Social Connection and Isolation Panel    Frequency of Communication with Friends and Family: More than three times a week    Frequency of Social Gatherings with Friends and Family: More than three times a week    Attends Religious Services: More than 4 times per year    Active Member of [employer] or Organizations: Yes    Attends Engineer, structural: More than 4 times per year    Marital Status: Married    Tobacco Counseling Counseling given: Yes   Clinical Intake: Pre-visit preparation completed: Yes Pain : No/denies pain Pain Score: 0-No pain   BMI - recorded: 20.36 Nutritional Status: BMI of 19-24  Normal Nutritional Risks: None Diabetes: No No results found for: HGBA1C  How often do you need to have someone help you when you read instructions, pamphlets, or other written materials from your doctor or pharmacy?: 1 - Never Interpreter Needed?: No Information entered by :: Shyonna Carlin W CMA (AAMA)  Activities of Daily Living     07/17/2024    1:49 PM  In your present state of health, do you have any difficulty performing the following activities:  Hearing? 0  Vision? 0  Difficulty concentrating or making decisions? 0  Walking or climbing stairs? 0  Dressing or bathing? 0  Doing errands, shopping? 0  Preparing Food and  eating ? N  Using the Toilet? N  In the past six months, have you accidently leaked urine? N  Do you have problems with loss of bowel control? N  Managing your Medications? N  Managing your Finances? N  Housekeeping or managing your Housekeeping? N   Patient Care Team: Antonetta Rollene BRAVO, MD as PCP - General (Family Medicine) Verlin Lonni BIRCH, MD as Consulting Physician (Cardiology) Cleatus Collar, MD as Consulting Physician (Ophthalmology) Dr [facility], OD Darlean Ozell NOVAK, MD as Consulting Physician (Pulmonary Disease)  I have updated your Care Teams any recent Medical Services you may have received from other providers in the past year.     Assessment:   This is a routine wellness examination for Amanda Harmon.  Hearing/Vision screen Hearing Screening - Comments:: Patient denies any hearing difficulties.   Vision Screening - Comments:: Patient wears reading glasses only. Up to date with yearly exams.  Sees [facility]  Goals Addressed               This Visit's Progress     I want to increase my exercise and work on my balance (pt-stated)          Depression Screen     07/17/2024    1:50 PM 05/18/2024    8:40 AM 11/02/2023    1:48 PM 07/14/2023    1:11 PM 03/11/2023    1:05 PM 09/29/2022    2:14 PM 08/06/2021    1:24 PM  PHQ 2/9 Scores  PHQ - 2 Score 0 0 0 0 0 0 0  PHQ- 9 Score 0 1  0        Fall Risk     07/17/2024    1:49 PM 05/18/2024    8:39 AM 11/02/2023    1:47 PM 07/14/2023    1:17 PM 03/11/2023    1:04 PM  Fall Risk  Falls in the past year? 0 0 0 0 1  Number falls in past yr: 0 0 0 0 0  Injury with Fall? 0 0 0 0 0  Risk for fall due to : No Fall Risks  No Fall Risks History of fall(s) History of fall(s)  Follow up Falls evaluation completed;Education provided;Falls prevention discussed Falls evaluation completed Falls evaluation completed Falls prevention discussed;Education provided Falls evaluation completed     MEDICARE RISK AT HOME:  Medicare Risk at Home Any stairs in or around the home?: Yes If so, are there any without handrails?: No Home free of loose throw rugs in walkways, pet beds, electrical cords, etc?: Yes Adequate lighting in your home to reduce risk of falls?: Yes Life alert?: No Use of a cane, walker or w/c?: No Grab bars in the bathroom?: No Shower chair or bench in shower?: Yes (has access to it but patient doesn't need it.) Elevated toilet seat or a handicapped toilet?: No  TIMED UP AND GO: Was the test performed?  No  Cognitive Function: 6CIT completed    08/06/2021    1:26 PM  MMSE - Mini Mental State Exam  Not completed: Unable to complete        07/17/2024    1:49 PM 07/14/2023    1:10 PM 08/06/2021    1:26 PM 07/18/2020    1:20 PM 07/18/2019    1:26 PM  6CIT Screen  What Year? 0 points 0 points 0 points 0 points 0 points  What month? 0 points 0 points 0 points 0 points 0 points  What time? 0 points 0 points 0 points 0 points 0 points  Count back from 20 0 points 0 points 0 points 0 points 0 points  Months in reverse 0 points 0 points 0 points 0 points 0 points  Repeat phrase 0 points 0 points 0 points 0 points 0 points  Total Score 0 points 0 points 0 points 0 points 0 points    Immunizations Immunization History  Administered Date(s) Administered    sv, Bivalent, Protein Subunit Rsvpref,pf (Abrysvo) 07/20/2022   Fluad Quad(high Dose 65+) 07/19/2020, 08/05/2022   INFLUENZA, HIGH DOSE SEASONAL PF 07/05/2018, 06/12/2019   Influenza Split 06/17/2014, 07/23/2017   Influenza,inj,Quad PF,6+ Mos 07/20/2013, 07/01/2015, 07/15/2016   Influenza-Unspecified 07/22/2021, 07/12/2023   Moderna Covid-19 Fall Seasonal Vaccine 59yrs & older 07/06/2024   Moderna Covid-19 Vaccine Bivalent Booster 82yrs & up 07/03/2022, 06/14/2023   Moderna Sars-Covid-2 Vaccination 11/02/2019, 12/05/2019, 07/23/2020, 04/04/2021, 08/11/2021   Pneumococcal Conjugate-13 10/30/2014,  08/23/2017   Pneumococcal Polysaccharide-23 03/23/2012   Tdap 12/09/2012, 04/05/2023   Zoster Recombinant(Shingrix) 08/02/2019, 02/09/2020   Zoster, Live 02/24/2007    Screening Tests Health Maintenance  Topic Date Due   Influenza Vaccine  04/28/2024   DEXA SCAN  12/15/2024   COVID-19 Vaccine (9 - 2025-26 season) 01/04/2025   Mammogram  06/07/2025   Medicare Annual Wellness (AWV)  07/17/2025   DTaP/Tdap/Td (3 - Td or Tdap) 04/04/2033   Pneumococcal Vaccine: 50+ Years  Completed   Hepatitis C Screening  Completed   Zoster Vaccines- Shingrix  Completed   Meningococcal B Vaccine  Aged Out   Colonoscopy  Discontinued   Fecal DNA (Cologuard)  Discontinued    Health Maintenance Health Maintenance Due  Topic Date Due   Influenza Vaccine  04/28/2024   Health Maintenance Items Addressed: Routine screenings are up to date.   Additional Screening: Vision Screening: Recommended annual ophthalmology exams for early detection of glaucoma and other disorders  of the eye. Would you like a referral to an eye doctor? No    Dental Screening: Recommended annual dental exams for proper oral hygiene  Community Resource Referral / Chronic Care Management: CRR required this visit?  No   CCM required this visit?  No  Plan:   I have personally reviewed and noted the following in the patient's chart:   Medical and social history Use of alcohol, tobacco or illicit drugs  Current medications and supplements including opioid prescriptions. Patient is not currently taking opioid prescriptions. Functional ability and status Nutritional status Physical activity Advanced directives List of other physicians Hospitalizations, surgeries, and ER visits in previous 12 months Vitals Screenings to include cognitive, depression, and falls Referrals and appointments  In addition, I have reviewed and discussed with patient certain preventive protocols, quality metrics, and best practice  recommendations. A written personalized care plan for preventive services as well as general preventive health recommendations were provided to patient.   Jaquann Guarisco, CMA   07/17/2024   After Visit Summary: (MyChart) Due to this being a telephonic visit, the after visit summary with patients personalized plan was offered to patient via MyChart   Notes: Nothing significant to report at this time.

## 2024-07-22 ENCOUNTER — Other Ambulatory Visit: Payer: Self-pay | Admitting: "Endocrinology

## 2024-08-12 ENCOUNTER — Other Ambulatory Visit: Payer: Self-pay | Admitting: Family Medicine

## 2024-10-20 ENCOUNTER — Other Ambulatory Visit: Payer: Self-pay | Admitting: "Endocrinology

## 2024-10-25 ENCOUNTER — Other Ambulatory Visit: Payer: Self-pay

## 2024-11-21 ENCOUNTER — Ambulatory Visit: Admitting: Family Medicine

## 2025-01-04 ENCOUNTER — Ambulatory Visit: Admitting: "Endocrinology

## 2025-07-18 ENCOUNTER — Ambulatory Visit
